# Patient Record
Sex: Female | Born: 1950
Health system: Southern US, Community
[De-identification: ages and names within clinical notes are randomized; demographics above are authoritative.]

## PROBLEM LIST (undated history)

## (undated) DIAGNOSIS — I639 Cerebral infarction, unspecified: Secondary | ICD-10-CM

## (undated) DIAGNOSIS — Z9289 Personal history of other medical treatment: Secondary | ICD-10-CM

## (undated) DIAGNOSIS — H409 Unspecified glaucoma: Secondary | ICD-10-CM

## (undated) DIAGNOSIS — I6529 Occlusion and stenosis of unspecified carotid artery: Secondary | ICD-10-CM

## (undated) DIAGNOSIS — I4891 Unspecified atrial fibrillation: Secondary | ICD-10-CM

## (undated) DIAGNOSIS — M109 Gout, unspecified: Secondary | ICD-10-CM

## (undated) DIAGNOSIS — M1990 Unspecified osteoarthritis, unspecified site: Secondary | ICD-10-CM

## (undated) DIAGNOSIS — I10 Essential (primary) hypertension: Secondary | ICD-10-CM

## (undated) HISTORY — DX: Unspecified atrial fibrillation: I48.91

## (undated) HISTORY — DX: Personal history of other medical treatment: Z92.89

## (undated) HISTORY — DX: Occlusion and stenosis of unspecified carotid artery: I65.29

---

## 1979-07-02 HISTORY — PX: BREAST BIOPSY: SHX20

## 1998-10-18 ENCOUNTER — Encounter (HOSPITAL_COMMUNITY): Admission: RE | Admit: 1998-10-18 | Discharge: 1999-01-16 | Payer: Self-pay | Admitting: Gynecology

## 1999-01-16 ENCOUNTER — Inpatient Hospital Stay (HOSPITAL_COMMUNITY): Admission: AD | Admit: 1999-01-16 | Discharge: 1999-01-16 | Payer: Self-pay | Admitting: Obstetrics and Gynecology

## 1999-01-26 ENCOUNTER — Other Ambulatory Visit: Admission: RE | Admit: 1999-01-26 | Discharge: 1999-01-26 | Payer: Self-pay | Admitting: Obstetrics and Gynecology

## 1999-02-15 ENCOUNTER — Inpatient Hospital Stay (HOSPITAL_COMMUNITY): Admission: EM | Admit: 1999-02-15 | Discharge: 1999-02-15 | Payer: Self-pay | Admitting: Gynecology

## 2000-06-30 ENCOUNTER — Other Ambulatory Visit: Admission: RE | Admit: 2000-06-30 | Discharge: 2000-06-30 | Payer: Self-pay | Admitting: Obstetrics and Gynecology

## 2009-10-31 HISTORY — PX: KNEE ARTHROSCOPY W/ DEBRIDEMENT: SHX1867

## 2012-10-09 ENCOUNTER — Inpatient Hospital Stay (HOSPITAL_COMMUNITY)
Admission: EM | Admit: 2012-10-09 | Discharge: 2012-10-11 | DRG: 065 | Disposition: A | Discharge status: Home / Self Care | Payer: 59 | Attending: Internal Medicine | Admitting: Internal Medicine

## 2012-10-09 ENCOUNTER — Emergency Department (HOSPITAL_COMMUNITY): Payer: 59

## 2012-10-09 ENCOUNTER — Encounter (HOSPITAL_COMMUNITY): Payer: Self-pay | Admitting: Emergency Medicine

## 2012-10-09 DIAGNOSIS — I69398 Other sequelae of cerebral infarction: Secondary | ICD-10-CM

## 2012-10-09 DIAGNOSIS — I10 Essential (primary) hypertension: Secondary | ICD-10-CM

## 2012-10-09 DIAGNOSIS — IMO0001 Reserved for inherently not codable concepts without codable children: Secondary | ICD-10-CM

## 2012-10-09 DIAGNOSIS — G459 Transient cerebral ischemic attack, unspecified: Secondary | ICD-10-CM

## 2012-10-09 DIAGNOSIS — E669 Obesity, unspecified: Secondary | ICD-10-CM

## 2012-10-09 DIAGNOSIS — I639 Cerebral infarction, unspecified: Secondary | ICD-10-CM

## 2012-10-09 DIAGNOSIS — E7800 Pure hypercholesterolemia, unspecified: Secondary | ICD-10-CM

## 2012-10-09 DIAGNOSIS — R7989 Other specified abnormal findings of blood chemistry: Secondary | ICD-10-CM

## 2012-10-09 DIAGNOSIS — H409 Unspecified glaucoma: Secondary | ICD-10-CM

## 2012-10-09 DIAGNOSIS — R209 Unspecified disturbances of skin sensation: Secondary | ICD-10-CM

## 2012-10-09 DIAGNOSIS — I6350 Cerebral infarction due to unspecified occlusion or stenosis of unspecified cerebral artery: Principal | ICD-10-CM | POA: Diagnosis present

## 2012-10-09 DIAGNOSIS — Z6841 Body Mass Index (BMI) 40.0 and over, adult: Secondary | ICD-10-CM

## 2012-10-09 DIAGNOSIS — M109 Gout, unspecified: Secondary | ICD-10-CM

## 2012-10-09 DIAGNOSIS — Z79899 Other long term (current) drug therapy: Secondary | ICD-10-CM

## 2012-10-09 HISTORY — DX: Gout, unspecified: M10.9

## 2012-10-09 HISTORY — DX: Unspecified osteoarthritis, unspecified site: M19.90

## 2012-10-09 HISTORY — DX: Cerebral infarction, unspecified: I63.9

## 2012-10-09 HISTORY — DX: Essential (primary) hypertension: I10

## 2012-10-09 HISTORY — DX: Unspecified glaucoma: H40.9

## 2012-10-09 LAB — COMPREHENSIVE METABOLIC PANEL
AST: 18 U/L (ref 0–37)
CO2: 26 mEq/L (ref 19–32)
Calcium: 9.6 mg/dL (ref 8.4–10.5)
Creatinine, Ser: 0.89 mg/dL (ref 0.50–1.10)
GFR calc Af Amer: 79 mL/min — ABNORMAL LOW (ref >90)
GFR calc non Af Amer: 68 mL/min — ABNORMAL LOW (ref >90)

## 2012-10-09 LAB — CREATININE, SERUM
Creatinine, Ser: 0.84 mg/dL (ref 0.50–1.10)
GFR calc Af Amer: 85 mL/min — ABNORMAL LOW (ref >90)
GFR calc non Af Amer: 73 mL/min — ABNORMAL LOW (ref >90)

## 2012-10-09 LAB — DIFFERENTIAL
Basophils Absolute: 0 10*3/uL (ref 0.0–0.1)
Eosinophils Absolute: 0.1 10*3/uL (ref 0.0–0.7)
Eosinophils Relative: 1 % (ref 0–5)
Lymphocytes Relative: 24 % (ref 12–46)
Monocytes Absolute: 0.3 10*3/uL (ref 0.1–1.0)

## 2012-10-09 LAB — GLUCOSE, CAPILLARY: Glucose-Capillary: 80 mg/dL (ref 70–99)

## 2012-10-09 LAB — CBC
HCT: 37.2 % (ref 36.0–46.0)
MCH: 27.9 pg (ref 26.0–34.0)
MCHC: 32.5 g/dL (ref 30.0–36.0)
MCV: 85.7 fL (ref 78.0–100.0)
Platelets: 258 10*3/uL (ref 150–400)
RBC: 4.64 MIL/uL (ref 3.87–5.11)
RDW: 13.8 % (ref 11.5–15.5)
RDW: 13.7 % (ref 11.5–15.5)
WBC: 8.7 10*3/uL (ref 4.0–10.5)
WBC: 10.2 10*3/uL (ref 4.0–10.5)

## 2012-10-09 LAB — RAPID URINE DRUG SCREEN, HOSP PERFORMED
Cocaine: NOT DETECTED
Opiates: NOT DETECTED
Tetrahydrocannabinol: NOT DETECTED

## 2012-10-09 MED ORDER — ASPIRIN 81 MG PO CHEW
324.0000 mg | CHEWABLE_TABLET | Freq: Once | ORAL | Status: AC
  Administered 2012-10-09: 324 mg via ORAL
  Filled 2012-10-09: qty 4

## 2012-10-09 MED ORDER — ALLOPURINOL 100 MG PO TABS
100.0000 mg | ORAL_TABLET | Freq: Every evening | ORAL | Status: DC
  Administered 2012-10-09 – 2012-10-10 (×2): 100 mg via ORAL
  Filled 2012-10-09 (×3): qty 1

## 2012-10-09 MED ORDER — LATANOPROST 0.005 % OP SOLN
1.0000 [drp] | Freq: Every day | OPHTHALMIC | Status: DC
  Administered 2012-10-09 – 2012-10-10 (×2): 1 [drp] via OPHTHALMIC
  Filled 2012-10-09: qty 2.5

## 2012-10-09 MED ORDER — METOPROLOL TARTRATE 12.5 MG HALF TABLET
12.5000 mg | ORAL_TABLET | Freq: Two times a day (BID) | ORAL | Status: DC
  Administered 2012-10-09 – 2012-10-10 (×3): 12.5 mg via ORAL
  Filled 2012-10-09 (×5): qty 1

## 2012-10-09 MED ORDER — SODIUM CHLORIDE 0.9 % IV SOLN
INTRAVENOUS | Status: DC
  Administered 2012-10-09 – 2012-10-10 (×2): via INTRAVENOUS

## 2012-10-09 MED ORDER — ATORVASTATIN CALCIUM 10 MG PO TABS
10.0000 mg | ORAL_TABLET | Freq: Every day | ORAL | Status: DC
  Administered 2012-10-09 – 2012-10-10 (×2): 10 mg via ORAL
  Filled 2012-10-09 (×2): qty 1

## 2012-10-09 MED ORDER — ATORVASTATIN CALCIUM 80 MG PO TABS: 80.0000 mg | ORAL_TABLET | Freq: Every day | ORAL | Status: DC

## 2012-10-09 MED ORDER — ASPIRIN 300 MG RE SUPP
300.0000 mg | Freq: Every day | RECTAL | Status: DC
  Filled 2012-10-09 (×2): qty 1

## 2012-10-09 MED ORDER — SENNOSIDES-DOCUSATE SODIUM 8.6-50 MG PO TABS
1.0000 | ORAL_TABLET | Freq: Every evening | ORAL | Status: DC | PRN
  Filled 2012-10-09: qty 1

## 2012-10-09 MED ORDER — ENOXAPARIN SODIUM 40 MG/0.4ML ~~LOC~~ SOLN
40.0000 mg | SUBCUTANEOUS | Status: DC
  Administered 2012-10-09 – 2012-10-10 (×2): 40 mg via SUBCUTANEOUS
  Filled 2012-10-09 (×3): qty 0.4

## 2012-10-09 MED ORDER — ATORVASTATIN CALCIUM 80 MG PO TABS
80.0000 mg | ORAL_TABLET | Freq: Once | ORAL | Status: AC
  Administered 2012-10-09: 80 mg via ORAL
  Filled 2012-10-09: qty 1

## 2012-10-09 MED ORDER — COLCHICINE 0.6 MG PO TABS
0.6000 mg | ORAL_TABLET | Freq: Every evening | ORAL | Status: DC
  Administered 2012-10-09 – 2012-10-10 (×2): 0.6 mg via ORAL
  Filled 2012-10-09 (×2): qty 1

## 2012-10-09 MED ORDER — ASPIRIN 325 MG PO TABS
325.0000 mg | ORAL_TABLET | Freq: Every day | ORAL | Status: DC
  Administered 2012-10-10 – 2012-10-11 (×2): 325 mg via ORAL
  Filled 2012-10-09 (×2): qty 1

## 2012-10-09 NOTE — ED Notes (Signed)
Troponin results received 0.79

## 2012-10-09 NOTE — Consult Note (Signed)
Referring Physician: Lynelle Doctor    Chief Complaint: Left arm decreased sensation  HPI:                                                                                                                                         Erin Holt is an 61 y.o. female who noted yesterday around 4 PM she had decreased taste in her left tongue along with decreased sensation in the left thenar portion of her hand. This lasted for about 1 hour than subsided.  This AM she awoke with no symptoms.  Round 8AM she had taken a shower and noted decreased sensation in her left aspect of her face, left arm from elbow to wrist and left great toe which evolved to decreased sensation of the left lateral leg.  She denies any weakness, visual problems, speech difficulty, or gait abnormality. Since in the ED her left leg and face have improved. Subjectively she feels her forearm still has decreased sensation but improving.   LSN: [id number] tPA Given: No: improving symptoms  Past Medical History  Diagnosis Date  . Hypertension   . Glaucoma as birth trauma   . Gout     Past surgical history: Left hip surgery  Family history: Mother: DM Father: DM  Social History: She has never smoked. She does not drink alcohol or take part in illicit drugs  Allergies: No Known Allergies  Medications:                                                                                                                           Current Facility-Administered Medications  Medication Dose Route Frequency Provider Last Rate Last Dose  . [COMPLETED] aspirin chewable tablet 324 mg  324 mg Oral Once Celene Kras, MD   324 mg at 10/09/12 1315   Current Outpatient Prescriptions  Medication Sig Dispense Refill  . allopurinol (ZYLOPRIM) 100 MG tablet Take 100 mg by mouth every evening.      . colchicine 0.6 MG tablet Take 0.6 mg by mouth every evening.      . latanoprost (XALATAN) 0.005 % ophthalmic solution Place 1 drop into both eyes at bedtime.          ROS:  History obtained from the patient  General ROS: negative for - chills, fatigue, fever, night sweats, weight gain or weight loss Psychological ROS: negative for - behavioral disorder, hallucinations, memory difficulties, mood swings or suicidal ideation Ophthalmic ROS: negative for - blurry vision, double vision, eye pain or loss of vision ENT ROS: negative for - epistaxis, nasal discharge, oral lesions, sore throat, tinnitus or vertigo Allergy and Immunology ROS: negative for - hives or itchy/watery eyes Hematological and Lymphatic ROS: negative for - bleeding problems, bruising or swollen lymph nodes Endocrine ROS: negative for - galactorrhea, hair pattern changes, polydipsia/polyuria or temperature intolerance Respiratory ROS: negative for - cough, hemoptysis, shortness of breath or wheezing Cardiovascular ROS: negative for - chest pain, dyspnea on exertion, edema or irregular heartbeat Gastrointestinal ROS: negative for - abdominal pain, diarrhea, hematemesis, nausea/vomiting or stool incontinence Genito-Urinary ROS: negative for - dysuria, hematuria, incontinence or urinary frequency/urgency Musculoskeletal ROS: positive for - joint pain due to gout Neurological ROS: As noted in HPI Dermatological ROS: negative for rash and skin lesion changes  Neurologic Examination:                                                                                                      Blood pressure 195/96, pulse 77, temperature 98.2 F (36.8 C), resp. rate 16, SpO2 100.00%.  Mental Status: Alert, oriented, thought content appropriate.  Speech fluent without evidence of aphasia.  Able to follow 3 step commands without difficulty. Cranial Nerves: II: Discs flat bilaterally; Visual fields grossly normal, pupils equal, round, reactive to light and  accommodation III,IV, VI: ptosis not present, extra-ocular motions intact bilaterally V,VII: smile symmetric, facial light touch sensation normal bilaterally VIII: hearing normal bilaterally IX,X: gag reflex present XI: bilateral shoulder shrug XII: midline tongue extension Motor: Right : Upper extremity   5/5    Left:     Upper extremity   5/5  Lower extremity   5/5     Lower extremity   5/5 with external rotation at the hip at rest Tone and bulk:normal tone throughout; no atrophy noted Sensory: light touch is decreased over the lateral aspect of her forearm to above the elbow.  Decreased pinprick and light touch in the left hand.  Otherwise intact to light touch throughout.  Deep Tendon Reflexes: 2+ and symmetric UE, 1+ bilateral KJ, no AJ noted bilaterally Plantars: Right: downgoing   Left: downgoing Cerebellar: normal finger-to-nose,  normal heel-to-shin test CV: pulses palpable throughout     Results for orders placed during the hospital encounter of 10/09/12 (from the past 48 hour(s))  PROTIME-INR     Status: Normal   Collection Time   10/09/12 10:01 AM      Component Value Range Comment   Prothrombin Time 12.9  11.6 - 15.2 seconds    INR 0.98  0.00 - 1.49   APTT     Status: Normal   Collection Time   10/09/12 10:01 AM      Component Value Range Comment   aPTT 31  24 - 37 seconds   CBC  Status: Normal   Collection Time   10/09/12 10:01 AM      Component Value Range Comment   WBC 8.7  4.0 - 10.5 K/uL    RBC 4.34  3.87 - 5.11 MIL/uL    Hemoglobin 12.1  12.0 - 15.0 g/dL    HCT 45.4  09.8 - 11.9 %    MCV 85.7  78.0 - 100.0 fL    MCH 27.9  26.0 - 34.0 pg    MCHC 32.5  30.0 - 36.0 g/dL    RDW 14.7  82.9 - 56.2 %    Platelets 231  150 - 400 K/uL   DIFFERENTIAL     Status: Normal   Collection Time   10/09/12 10:01 AM      Component Value Range Comment   Neutrophils Relative 71  43 - 77 %    Neutro Abs 6.2  1.7 - 7.7 K/uL    Lymphocytes Relative 24  12 - 46 %     Lymphs Abs 2.1  0.7 - 4.0 K/uL    Monocytes Relative 4  3 - 12 %    Monocytes Absolute 0.3  0.1 - 1.0 K/uL    Eosinophils Relative 1  0 - 5 %    Eosinophils Absolute 0.1  0.0 - 0.7 K/uL    Basophils Relative 0  0 - 1 %    Basophils Absolute 0.0  0.0 - 0.1 K/uL   COMPREHENSIVE METABOLIC PANEL     Status: Abnormal   Collection Time   10/09/12 10:01 AM      Component Value Range Comment   Sodium 140  135 - 145 mEq/L    Potassium 4.0  3.5 - 5.1 mEq/L    Chloride 104  96 - 112 mEq/L    CO2 26  19 - 32 mEq/L    Glucose, Bld 102 (*) 70 - 99 mg/dL    BUN 19  6 - 23 mg/dL    Creatinine, Ser 1.30  0.50 - 1.10 mg/dL    Calcium 9.6  8.4 - 86.5 mg/dL    Total Protein 7.5  6.0 - 8.3 g/dL    Albumin 3.5  3.5 - 5.2 g/dL    AST 18  0 - 37 U/L    ALT 11  0 - 35 U/L    Alkaline Phosphatase 79  39 - 117 U/L    Total Bilirubin 0.4  0.3 - 1.2 mg/dL    GFR calc non Af Amer 68 (*) >90 mL/min    GFR calc Af Amer 79 (*) >90 mL/min   TROPONIN I     Status: Abnormal   Collection Time   10/09/12 10:03 AM      Component Value Range Comment   Troponin I 0.79 (*) <0.30 ng/mL   URINE RAPID DRUG SCREEN (HOSP PERFORMED)     Status: Normal   Collection Time   10/09/12 10:33 AM      Component Value Range Comment   Opiates NONE DETECTED  NONE DETECTED    Cocaine NONE DETECTED  NONE DETECTED    Benzodiazepines NONE DETECTED  NONE DETECTED    Amphetamines NONE DETECTED  NONE DETECTED    Tetrahydrocannabinol NONE DETECTED  NONE DETECTED    Barbiturates NONE DETECTED  NONE DETECTED   GLUCOSE, CAPILLARY     Status: Normal   Collection Time   10/09/12 10:38 AM      Component Value Range Comment   Glucose-Capillary 86  70 - 99 mg/dL  Ct Head Wo Contrast  10/09/2012  *RADIOLOGY REPORT*  Clinical Data: Left-sided numbness.  Elevated blood pressure.  CT HEAD WITHOUT CONTRAST  Technique:  Contiguous axial images were obtained from the base of the skull through the vertex without contrast.  Comparison: None.   Findings: There is no evidence for acute infarction, intracranial hemorrhage, mass lesion, hydrocephalus, or extra-axial fluid.  No cerebral or cerebellar atrophy.  Hypodensity throughout the subcortical and periventricular white matter likely represents chronic microvascular ischemic change.  No large vessel infarct. Brainstem and cerebellum grossly unremarkable.  Bone windows reveal moderate bony overgrowth anteriorly in the middle cranial fossa and anterior cranial fossa consistent with hyperostosis frontalis interna.  The mastoid air cells are clear.  There is mild ethmoid fluid on the left.  Frontal sinuses are clear. No visible sphenoid or mastoid fluid.  IMPRESSION: No acute intracranial findings.  Bilateral subcortical and deep white matter hypodensity consistent with chronic microvascular ischemic change.   Original Report Authenticated By: Davonna Belling, M.D.     Felicie Morn PA-C [facility] [phone]  10/09/2012, 1:32 PM   Patient seen and examined.  Clinical course and management discussed.  Necessary edits performed.  I agree with the above.  Assessment and plan of care developed and discussed below.    Assessment: 61 y.o. female presenting with left sided numbness.  Symptoms improving while in the ED.  Patient with vascular risk factor of HTN that has been poorly controlled due to lack of follow up.  Further work up indicated.  Patient on no antiplatelet therapy.  Stroke Risk Factors - hypertension  Plan: 1. HgbA1c, fasting lipid panel 2. MRI, MRA  of the brain without contrast 3. OT consult 4. Echocardiogram 5. Carotid dopplers 6. Prophylactic therapy-Antiplatelet med: Aspirin - dose 325mg  daily 7. Risk factor modification 8. Telemetry monitoring 9. Frequent neuro checks  Thana Farr, MD [facility] [phone]  10/09/2012  3:29 PM

## 2012-10-09 NOTE — H&P (Signed)
PATIENT DETAILS Name: Erin Holt Age: 61 y.o. Sex: female Date of Birth: 1951/04/30 Admit Date: 10/09/2012 OZH:[id number],ONGEX CHARLES, MD   CHIEF COMPLAINT:  Left facial numbness and left arm numbness since 4 PM yesterday  HPI: Patient is a 61 year old African American female with a history of gout, glaucoma, morbid obesity who presents to the ED today for evaluation of the above noted complaints. The patient on 4 PM yesterday she had sudden onset of left facial and left hand numbness. This  apparently lasted for approximately an hour and resolved spontaneously. However it reoccurred around 8:00 this morning. She also noted that there was some numbness in her left great toe area. She denied any dysarthria or any weakness. She subsequently presented to ED for further evaluation, I was subsequently asked to admit this patient. There is no history of chest pain or shortness of breath There is no history of headache There is no history of nausea vomiting or diarrhea There is no history of abdominal pain  ALLERGIES:  No Known Allergies  PAST MEDICAL HISTORY: Past Medical History  Diagnosis Date  . Hypertension   . Glaucoma as birth trauma   . Gout     PAST SURGICAL HISTORY: No past surgical history on file.  MEDICATIONS AT HOME: Prior to Admission medications   Medication Sig Start Date End Date Taking? Authorizing Provider  allopurinol (ZYLOPRIM) 100 MG tablet Take 100 mg by mouth every evening.   Yes Historical Provider, MD  colchicine 0.6 MG tablet Take 0.6 mg by mouth every evening.   Yes Historical Provider, MD  latanoprost (XALATAN) 0.005 % ophthalmic solution Place 1 drop into both eyes at bedtime.   Yes Historical Provider, MD    FAMILY HISTORY: No family history on file.  SOCIAL HISTORY:  reports that she has never smoked. She does not have any smokeless tobacco history on file. She reports that she does not drink alcohol. Her drug history not on file.  REVIEW  OF SYSTEMS:  Constitutional:   No  weight loss, night sweats,  Fevers, chills, fatigue.  HEENT:    No headaches, Difficulty swallowing,Tooth/dental problems,Sore throat,  No sneezing, itching, ear ache, nasal congestion, post nasal drip,   Cardio-vascular: No chest pain,  Orthopnea, PND, swelling in lower extremities, anasarca, dizziness, palpitations  GI:  No heartburn, indigestion, abdominal pain, nausea, vomiting, diarrhea, change in  bowel habits, loss of appetite  Resp: No shortness of breath with exertion or at rest.  No excess mucus, no productive cough, No non-productive cough,  No coughing up of blood.No change in color of mucus.No wheezing.No chest wall deformity  Skin:  no rash or lesions.  GU:  no dysuria, change in color of urine, no urgency or frequency.  No flank pain.  Musculoskeletal: No joint pain or swelling.  No decreased range of motion.  No back pain.  Psych: No change in mood or affect. No depression or anxiety.  No memory loss.   PHYSICAL EXAM: Blood pressure 195/96, pulse 77, temperature 98.2 F (36.8 C), resp. rate 16, SpO2 100.00%.  General appearance :Awake, alert, not in any distress. Speech Clear. Not toxic Looking HEENT: Atraumatic and Normocephalic, pupils equally reactive to light and accomodation Neck: supple, no JVD. No cervical lymphadenopathy.  Chest:Good air entry bilaterally, no added sounds  CVS: S1 S2 regular, no murmurs.  Abdomen: Bowel sounds present, Non tender and not distended with no gaurding, rigidity or rebound. Extremities: B/L Lower Ext shows no edema, both legs are warm to  touch, with  dorsalis pedis pulses palpable. Neurology: Awake alert, and oriented X 3, CN II-XII intact, Non focal Skin:No Rash Wounds:N/A  LABS ON ADMISSION:   Basename 10/09/12 1001  NA 140  K 4.0  CL 104  CO2 26  GLUCOSE 102*  BUN 19  CREATININE 0.89  CALCIUM 9.6  MG --  PHOS --    Basename 10/09/12 1001  AST 18  ALT 11  ALKPHOS 79   BILITOT 0.4  PROT 7.5  ALBUMIN 3.5   No results found for this basename: LIPASE:2,AMYLASE:2 in the last 72 hours  Basename 10/09/12 1001  WBC 8.7  NEUTROABS 6.2  HGB 12.1  HCT 37.2  MCV 85.7  PLT 231    Basename 10/09/12 1003  CKTOTAL --  CKMB --  CKMBINDEX --  TROPONINI 0.79*   No results found for this basename: DDIMER:2 in the last 72 hours No components found with this basename: POCBNP:3   RADIOLOGIC STUDIES ON ADMISSION: Ct Head Wo Contrast  10/09/2012  *RADIOLOGY REPORT*  Clinical Data: Left-sided numbness.  Elevated blood pressure.  CT HEAD WITHOUT CONTRAST  Technique:  Contiguous axial images were obtained from the base of the skull through the vertex without contrast.  Comparison: None.  Findings: There is no evidence for acute infarction, intracranial hemorrhage, mass lesion, hydrocephalus, or extra-axial fluid.  No cerebral or cerebellar atrophy.  Hypodensity throughout the subcortical and periventricular white matter likely represents chronic microvascular ischemic change.  No large vessel infarct. Brainstem and cerebellum grossly unremarkable.  Bone windows reveal moderate bony overgrowth anteriorly in the middle cranial fossa and anterior cranial fossa consistent with hyperostosis frontalis interna.  The mastoid air cells are clear.  There is mild ethmoid fluid on the left.  Frontal sinuses are clear. No visible sphenoid or mastoid fluid.  IMPRESSION: No acute intracranial findings.  Bilateral subcortical and deep white matter hypodensity consistent with chronic microvascular ischemic change.   Original Report Authenticated By: Davonna Belling, M.D.     ASSESSMENT AND PLAN: Present on Admission:  . CVA (cerebral infarction) - Probable CVA  - Was admitted to neuro telemetry unit, start on aspirin and statin -Well, and stroke workup with MRI of the brain, echocardiogram and carotid Doppler  - Neurology has been consulted   . Elevated troponin - Suspect false positive  elevation, possibly from above  - She has no chest pain or shortness of breath, EKG does not show any acute changes  - Place on statin, aspirin and beta blockers  - Await 2-D echocardiogram  - Have consulted [facility] cardiology   . Gout - Continue with colchicine   . Glaucoma - Continue with her home eyedrops   . Obesity - Have counseled the importance of weight loss  Further plan will depend as patient's clinical course evolves and further radiologic and laboratory data become available. Patient will be monitored closely.   DVT Prophylaxis: - Prophylactic Lovenox  Code Status: - Full code  Total time spent for admission equals 45 minutes.  [facility] Pager [phone]  If 7PM-7AM, please contact night-coverage [email or web address] Password [email or web address] 10/09/2012, 1:21 PM

## 2012-10-09 NOTE — ED Notes (Signed)
MD at bedside; admitting

## 2012-10-09 NOTE — ED Notes (Signed)
Yesterday noticed that left side side left of tongue was numb nad left side of hand numb . Went to bed slept well and this am  She felt numbness down to toes and left side of face was numb pt able to walk well and has no slurred speech at this time

## 2012-10-09 NOTE — ED Notes (Signed)
Admitting MD made aware of elevated troponin

## 2012-10-09 NOTE — ED Provider Notes (Signed)
History     CSN: [id number] Arrival date & time 10/09/12  4098 First MD Initiated Contact with Patient 10/09/12 0944      Patient is a 61 y.o. female presenting with neurologic complaint. The history is provided by the patient.  Neurologic Problem The primary symptoms include loss of sensation. Primary symptoms do not include headaches, focal weakness or speech change. The symptoms began yesterday. The symptoms are unchanged. The neurological symptoms are focal.  Description of loss of sensation: it feels tingly like when it goes to sleep. Location of loss of sensation: left face and left hand.  Additional symptoms do not include neck stiffness, weakness, pain or photophobia. Medical issues also include hypertension.  Yesterday she noticed the symptoms start but they then resolved.  She noticed this morning that her left arm felt numb as well.  Past Medical History  Diagnosis Date  . Hypertension   . Glaucoma as birth trauma   . Gout     No past surgical history on file.  No family history on file.  History  Substance Use Topics  . Smoking status: Never Smoker   . Smokeless tobacco: Not on file  . Alcohol Use: No    OB History    Grav Para Term Preterm Abortions TAB SAB Ect Mult Living                  Review of Systems  HENT: Negative for neck stiffness.   Eyes: Negative for photophobia.  Neurological: Negative for speech change, focal weakness, weakness and headaches.  All other systems reviewed and are negative.    Allergies  Review of patient's allergies indicates no known allergies.  Home Medications  No current outpatient prescriptions on file.  BP 195/96  Pulse 77  Temp 98.9 F (37.2 C)  Resp 16  SpO2 100%  Physical Exam  Nursing note and vitals reviewed. Constitutional: She is oriented to person, place, and time. She appears well-developed and well-nourished. No distress.  HENT:  Head: Normocephalic and atraumatic.  Right Ear: External ear  normal.  Left Ear: External ear normal.  Mouth/Throat: Oropharynx is clear and moist.  Eyes: Conjunctivae normal are normal. Right eye exhibits no discharge. Left eye exhibits no discharge. No scleral icterus.  Neck: Neck supple. No tracheal deviation present.  Cardiovascular: Normal rate, regular rhythm and intact distal pulses.   Pulmonary/Chest: Effort normal and breath sounds normal. No stridor. No respiratory distress. She has no wheezes. She has no rales.  Abdominal: Soft. Bowel sounds are normal. She exhibits no distension. There is no tenderness. There is no rebound and no guarding.  Musculoskeletal: She exhibits no edema and no tenderness.  Neurological: She is alert and oriented to person, place, and time. She has normal strength. A sensory deficit (feels similar both sides to light touch and scratching) is present. No cranial nerve deficit ( no gross defecits noted). She exhibits normal muscle tone. She displays no seizure activity. Coordination normal.       No pronator drift bilateral upper extrem, able to hold both legs off bed for 5 seconds, sensation intact in all extremities, no visual field cuts, no left or right sided neglect  Skin: Skin is warm and dry. No rash noted.  Psychiatric: She has a normal mood and affect.    ED Course  Procedures (including critical care time)  Rate: 68  Rhythm: normal sinus rhythm  QRS Axis: normal  Intervals: normal  ST/T Wave abnormalities: minimal st depression lateral leads  Conduction Disutrbances:none  Narrative Interpretation:   Old EKG Reviewed: none available  Labs Reviewed  COMPREHENSIVE METABOLIC PANEL - Abnormal; Notable for the following:    Glucose, Bld 102 (*)     GFR calc non Af Amer 68 (*)     GFR calc Af Amer 79 (*)     All other components within normal limits  TROPONIN I - Abnormal; Notable for the following:    Troponin I 0.79 (*)     All other components within normal limits  PROTIME-INR  APTT  CBC   DIFFERENTIAL  URINE RAPID DRUG SCREEN (HOSP PERFORMED)  GLUCOSE, CAPILLARY   Ct Head Wo Contrast  10/09/2012  *RADIOLOGY REPORT*  Clinical Data: Left-sided numbness.  Elevated blood pressure.  CT HEAD WITHOUT CONTRAST  Technique:  Contiguous axial images were obtained from the base of the skull through the vertex without contrast.  Comparison: None.  Findings: There is no evidence for acute infarction, intracranial hemorrhage, mass lesion, hydrocephalus, or extra-axial fluid.  No cerebral or cerebellar atrophy.  Hypodensity throughout the subcortical and periventricular white matter likely represents chronic microvascular ischemic change.  No large vessel infarct. Brainstem and cerebellum grossly unremarkable.  Bone windows reveal moderate bony overgrowth anteriorly in the middle cranial fossa and anterior cranial fossa consistent with hyperostosis frontalis interna.  The mastoid air cells are clear.  There is mild ethmoid fluid on the left.  Frontal sinuses are clear. No visible sphenoid or mastoid fluid.  IMPRESSION: No acute intracranial findings.  Bilateral subcortical and deep white matter hypodensity consistent with chronic microvascular ischemic change.   Original Report Authenticated By: Davonna Belling, M.D.       MDM  Pt noted to have chronic changes on CT.  No definite stroke but her symptoms are still concerning for that possibility.    Will plan on admission for further evaluation, stroke/tia workup.  Pt has an elevated troponin but no chest pain.  Doubt acute cardiac etiology.  Troponin could be related to CNS event.  Will continue to monitor.        Celene Kras, MD 10/09/12 [phone]

## 2012-10-09 NOTE — ED Notes (Signed)
Neuro PA at bedside.  

## 2012-10-09 NOTE — Consult Note (Signed)
Admit date: 10/09/2012 Referring Physician  Dr. Lynelle Doctor Primary Physician Darnelle Bos, MD Primary Cardiologist  None Reason for Consultation  Elevated troponin  HPI: 61 year old female who presented with neurologic symptoms of left facial numbness, left arm numbness, prior tongue paresthesias with comorbidities of morbid obesity, possible hypertension. During workup, troponin was elevated at 0.7 then 0.9 on repeat. She does not have any chest discomfort, no shortness of breath, no unilateral leg edema. She was performing heavy labor yesterday, sweeping for instance, with no exertional anginal symptoms. At approximally for p.m. yesterday she started to notice numbness in her tongue. This had progressed to the left side of her face as well his left arm and prompted an evaluation here. She denies any cough, fevers, chills, syncope, palpitations, orthopnea, PND. She is currently speaking very clearly. 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. [employer].   In 2009, her LDL was 175, HDL was 64. Her creatinine and liver functions at that time as well as electrolytes were normal. It had been mentioned that her blood pressure at times had been elevated.    PMH:   Past Medical History  Diagnosis Date  . Hypertension   . Glaucoma as birth trauma   . Gout     PSH:  No past surgical history on file. Allergies:  Review of patient's allergies indicates no known allergies. Prior to Admit Meds:   (Not in a hospital admission) Prior to Admission medications   Medication Sig Start Date End Date Taking? Authorizing Provider  allopurinol (ZYLOPRIM) 100 MG tablet Take 100 mg by mouth every evening.   Yes Historical Provider, MD  colchicine 0.6 MG tablet Take 0.6 mg by mouth every evening.   Yes Historical Provider, MD  latanoprost (XALATAN) 0.005 % ophthalmic solution Place 1 drop into both eyes at bedtime.   Yes Historical Provider, MD    Fam HX:   No family history on file. Social HX:     History   Social History  . Marital Status: Unknown    Spouse Name: N/A    Number of Children: N/A  . Years of Education: N/A   Occupational History  . Not on file.   Social History Main Topics  . Smoking status: Never Smoker   . Smokeless tobacco: Not on file  . Alcohol Use: No  . Drug Use:   . Sexually Active:    Other Topics Concern  . Not on file   Social History Narrative  . No narrative on file     ROS:  All 11 ROS were addressed and are negative except what is stated in the HPI  Physical Exam: Blood pressure 171/62, pulse 63, temperature 98.2 F (36.8 C), resp. rate 24, SpO2 100.00%.    General: Well developed, well nourished, in no acute distress Head: Eyes PERRLA, No xanthomas.   Normal cephalic and atramatic  Lungs:   Clear bilaterally to auscultation and percussion. Normal respiratory effort. No wheezes, no rales. Heart:   HRRR S1 S2 Pulses are 2+ & equal. No murmurs or rubs detected           No carotid bruit. No JVD.  No abdominal bruits. Abdomen: Bowel sounds are positive, abdomen soft and non-tender without masses. No hepatosplenomegaly. Obese. Msk:  Back normal. Normal strength and tone for age. Extremities:   No clubbing, cyanosis or edema.  DP +1 Neuro: Alert and oriented X 3, moves all extremities, 2+ grip strength bilaterally. She does note decreased sensation/sensation of numbness  throughout her left hand and part of her forearm as well as the left side of her face. She notes that this may be improving. GU: Deferred Rectal: Deferred Psych:  Good affect, responds appropriately    Labs:   Lab Results  Component Value Date   WBC 8.7 10/09/2012   HGB 12.1 10/09/2012   HCT 37.2 10/09/2012   MCV 85.7 10/09/2012   PLT 231 10/09/2012    Lab 10/09/12 1001  NA 140  K 4.0  CL 104  CO2 26  BUN 19  CREATININE 0.89  CALCIUM 9.6  PROT 7.5  BILITOT 0.4  ALKPHOS 79  ALT 11  AST 18  GLUCOSE 102*   No results found for this basename: PTT    Lab Results  Component Value Date   INR 0.98 10/09/2012   Lab Results  Component Value Date   TROPONINI 0.92* 10/09/2012        Radiology:  Ct Head Wo Contrast  10/09/2012  *RADIOLOGY REPORT*  Clinical Data: Left-sided numbness.  Elevated blood pressure.  CT HEAD WITHOUT CONTRAST  Technique:  Contiguous axial images were obtained from the base of the skull through the vertex without contrast.  Comparison: None.  Findings: There is no evidence for acute infarction, intracranial hemorrhage, mass lesion, hydrocephalus, or extra-axial fluid.  No cerebral or cerebellar atrophy.  Hypodensity throughout the subcortical and periventricular white matter likely represents chronic microvascular ischemic change.  No large vessel infarct. Brainstem and cerebellum grossly unremarkable.  Bone windows reveal moderate bony overgrowth anteriorly in the middle cranial fossa and anterior cranial fossa consistent with hyperostosis frontalis interna.  The mastoid air cells are clear.  There is mild ethmoid fluid on the left.  Frontal sinuses are clear. No visible sphenoid or mastoid fluid.  IMPRESSION: No acute intracranial findings.  Bilateral subcortical and deep white matter hypodensity consistent with chronic microvascular ischemic change.   Original Report Authenticated By: Davonna Belling, M.D.    Personally viewed.  EKG:  Sinus rhythm rate 79 with occasional PACs, nonspecific ST-T wave changes. Personally viewed.   ASSESSMENT/PLAN:   61 year old female with neurologic symptoms of left facial numbness, prior tongue numbness/paresthesia, left forearm/hand numbness with normal movement/strength, morbid obesity, elevated blood pressure, hyperlipidemia with mildly elevated troponin without any anginal symptoms.  1. Elevated troponin-this may be secondary to acute CVA/intracranial event. EKG does not show any evidence of significant ischemia. She does not describe any chest pain. She does not describe shortness of  breath, unilateral leg pain or swelling (i.e. no symptoms of PE). She currently appears calm, pleasant. An echocardiogram has been ordered as part of the stroke workup and this will be beneficial to evaluate her overall wall motion/ejection fraction. She has received aspirin. Further cardiac workup pending on ultrasound interpretation. If troponin becomes highly elevated, we may also consider further cardiac workup for ischemia at that time. For now, continue with monitoring. I would not add anticoagulation in the setting of possible stroke due to the possibility of hemorrhagic transformation. MRI will be helpful. Further risk stratification with stress testing may be reasonable when she is over this acute neurologic event and would likely be done as an outpatient.  2. Hyperlipidemia-prior LDL 175. I will add atorvastatin 80 mg. Check fasting lipid profile.  3. Elevated blood pressure-she has not had a confirmed diagnosis of hypertension. Continue to monitor. Blood pressure modification in the setting of possible stroke at the discretion of neurology.  4. Morbid obesity-continue to encourage weight loss.  5. Possible  strokelike symptoms-paresthesias noted. Per neurology.  We will follow with you.  Donato Schultz, MD  10/09/2012  2:36 PM

## 2012-10-09 NOTE — ED Notes (Addendum)
Dr Lynelle Doctor informed of troponin results . Already aware. Pt too CT at this time . Warm blanket provided, Side rails up . Pt made aware of elevated blood results - verbalized understanding

## 2012-10-09 NOTE — ED Notes (Signed)
MD at bedside.cardiology  

## 2012-10-10 ENCOUNTER — Inpatient Hospital Stay (HOSPITAL_COMMUNITY): Payer: 59

## 2012-10-10 LAB — GLUCOSE, CAPILLARY
Glucose-Capillary: 106 mg/dL — ABNORMAL HIGH (ref 70–99)
Glucose-Capillary: 96 mg/dL (ref 70–99)

## 2012-10-10 LAB — LIPID PANEL
HDL: 48 mg/dL (ref >39)
LDL Cholesterol: 111 mg/dL — ABNORMAL HIGH (ref 0–99)
Total CHOL/HDL Ratio: 4 RATIO

## 2012-10-10 LAB — TROPONIN I: Troponin I: 0.67 ng/mL (ref <0.30)

## 2012-10-10 MED ORDER — LORAZEPAM 2 MG/ML IJ SOLN
0.5000 mg | Freq: Once | INTRAMUSCULAR | Status: AC
  Administered 2012-10-10: 0.5 mg via INTRAVENOUS
  Filled 2012-10-10: qty 1

## 2012-10-10 MED ORDER — ATORVASTATIN CALCIUM 40 MG PO TABS
40.0000 mg | ORAL_TABLET | Freq: Every day | ORAL | Status: DC
  Filled 2012-10-10: qty 1

## 2012-10-10 NOTE — Progress Notes (Signed)
Stroke Team Progress Note  HISTORY Erin Holt is an 61 y.o. female who noted yesterday around 4 PM she had decreased taste in her left tongue along with decreased sensation in the left thenar portion of her hand. This lasted for about 1 hour than subsided. This AM she awoke with no symptoms. Round 8AM she had taken a shower and noted decreased sensation in her left aspect of her face, left arm from elbow to wrist and left great toe which evolved to decreased sensation of the left lateral leg. She denies any weakness, visual problems, speech difficulty, or gait abnormality. Since in the ED her left leg and face have improved. Subjectively she feels her forearm still has decreased sensation but improving. Patient was not a TPA candidate secondary to improving symptoms. She was admitted for further evaluation and treatment.  SUBJECTIVE She feels left sided numbness is improved but not back to baseline. No prior h/o stroke or TIA  OBJECTIVE Most recent Vital Signs: Filed Vitals:   10/10/12 0000 10/10/12 0200 10/10/12 0400 10/10/12 0800  BP: 173/94 165/90 191/89 181/94  Pulse: 62 65 73 63  Temp: 98.6 F (37 C) 98.4 F (36.9 C) 98.3 F (36.8 C) 98.3 F (36.8 C)  TempSrc: Oral Oral Oral Oral  Resp: 20 18 20 20   Height:      Weight:      SpO2: 97% 100% 96% 100%   CBG (last 3)   Basename 10/10/12 0725 10/09/12 2218 10/09/12 1718  GLUCAP 101* 114* 80   IV Fluid Intake:     . sodium chloride 75 mL/hr at 10/09/12 1755   MEDICATIONS    . allopurinol  100 mg Oral QPM  . [COMPLETED] aspirin  324 mg Oral Once  . aspirin  300 mg Rectal Daily   Or  . aspirin  325 mg Oral Daily  . atorvastatin  10 mg Oral q1800  . [COMPLETED] atorvastatin  80 mg Oral Once  . colchicine  0.6 mg Oral QPM  . enoxaparin  40 mg Subcutaneous Q24H  . latanoprost  1 drop Both Eyes QHS  . metoprolol tartrate  12.5 mg Oral BID  . [DISCONTINUED] atorvastatin  80 mg Oral q1800   PRN:  senna-docusate  Diet:   Cardiac thin liquids Activity:  OOB in chair DVT Prophylaxis:  Lovenox 40 mg sq daily   CLINICALLY SIGNIFICANT STUDIES Basic Metabolic Panel:  Lab 10/09/12 1610 10/09/12 1001  NA -- 140  K -- 4.0  CL -- 104  CO2 -- 26  GLUCOSE -- 102*  BUN -- 19  CREATININE 0.84 0.89  CALCIUM -- 9.6  MG -- --  PHOS -- --   Liver Function Tests:  Lab 10/09/12 1001  AST 18  ALT 11  ALKPHOS 79  BILITOT 0.4  PROT 7.5  ALBUMIN 3.5   CBC:  Lab 10/09/12 1742 10/09/12 1001  WBC 10.2 8.7  NEUTROABS -- 6.2  HGB 12.7 12.1  HCT 39.7 37.2  MCV 85.6 85.7  PLT 258 231   Coagulation:  Lab 10/09/12 1001  LABPROT 12.9  INR 0.98   Cardiac Enzymes:  Lab 10/09/12 1244 10/09/12 1003  CKTOTAL -- --  CKMB -- --  CKMBINDEX -- --  TROPONINI 0.92* 0.79*   Urinalysis: No results found for this basename: COLORURINE:2,APPERANCEUR:2,LABSPEC:2,PHURINE:2,GLUCOSEU:2,HGBUR:2,BILIRUBINUR:2,KETONESUR:2,PROTEINUR:2,UROBILINOGEN:2,NITRITE:2,LEUKOCYTESUR:2 in the last 168 hours Lipid Panel    Component Value Date/Time   CHOL 190 10/10/2012 0625   TRIG 156* 10/10/2012 0625   HDL 48 10/10/2012 0625  CHOLHDL 4.0 10/10/2012 0625   VLDL 31 10/10/2012 0625   LDLCALC 111* 10/10/2012 0625   HgbA1C  No results found for this basename: HGBA1C    Urine Drug Screen:     Component Value Date/Time   LABOPIA NONE DETECTED 10/09/2012 1033   COCAINSCRNUR NONE DETECTED 10/09/2012 1033   LABBENZ NONE DETECTED 10/09/2012 1033   AMPHETMU NONE DETECTED 10/09/2012 1033   THCU NONE DETECTED 10/09/2012 1033   LABBARB NONE DETECTED 10/09/2012 1033    Alcohol Level: No results found for this basename: ETH:2 in the last 168 hours  CT of the brain  10/09/2012  No acute intracranial findings.  Bilateral subcortical and deep white matter hypodensity consistent with chronic microvascular ischemic change.  MRI of the brain  Small Rt lateral thalamic infarct  MRA of the brain  No large vessel stenosis or occlusion  2D  Echocardiogram  pending  Carotid Doppler  pending CXR    EKG  normal sinus rhythm.   Therapy Recommendations pending  Physical Exam  Obese middle aged african american lady not in distress.Awake alert. Afebrile. Head is nontraumatic. Neck is supple without bruit. Hearing is normal. Cardiac exam no murmur or gallop. Lungs are clear to auscultation. Distal pulses are well felt.  Neurological Exam : Awake alert oriented x 3 normal speech and language.Fundi not visualized. Vision acuity and fields appear normal. No face asymmetry. Tongue midline. No drift. Mild diminished fine finger movements on left. Orbits right over left upper extremity. Mild left grip weak.. Normal sensation . Normal coordination.  ASSESSMENT Erin Holt is a 61 y.o. female presenting with decreased sensation on her left side. Imaging confirms a right thalamic infarct. Infarct felt to be thrombotic secondary to  Small vessel disease.  Work up underway. On no antiplatelet prior to admission. Now on aspirin 325 mg orally every day for secondary stroke prevention. Patient with resultant mild left UE weakness and numbness .   Elevated troponin, cardiology consulted Hyperlipidemia, LDL 111, now,on atorvastatin 80, goal LDL < 100 Hypertension Morbid obesity, Body mass index is 44.15 kg/(m^2).  Hospital day # 1  TREATMENT/PLAN  Continue aspirin 325 mg orally every day for secondary stroke prevention.  F/u 2D, CD, MRI, MRA, HgbA1c  SHARON BIBY, MSN, RN, ANVP-BC, ANP-BC, GNP-BC [facility] Pager: [phone] 10/10/2012 8:33 AM   I have personally examined this patient, reviewed pertinent data and developed the plan of care. I agree with above.   Delia Heady, MD Medical Director [facility] Pager: [phone] 10/10/2012 11:48 AM

## 2012-10-10 NOTE — Progress Notes (Signed)
SLP Cancellation Note  Patient Details Name: Erin Holt MRN: [id number] DOB: Dec 07, 1950   Cancelled treatment:       Reason Eval/Treat Not Completed: Other (comment) (Screened) Pt briefly screened for cognitive linguistic deficits. Appears WNL. Pt with no complaints. Will defer full evaluation. No SLP f/u, will sign off. Please reconsult if needed.   Harlon Ditty, MA CCC-SLP [phone]  Erin Holt 10/10/2012, 2:35 PM

## 2012-10-10 NOTE — Care Management Note (Signed)
    Page 1 of 1   10/10/2012     5:01:54 PM   CARE MANAGEMENT NOTE 10/10/2012  Patient:  Erin Holt, Erin Holt   Account Number:  [id number]  Date Initiated:  10/10/2012  Documentation initiated by:  Donn Pierini  Subjective/Objective Assessment:   Pt admitted for +CVA per MRI     Action/Plan:   PTA pt lived at home alone- PT/OT/ST evals pending- NCM to follow for d/c needs   Anticipated DC Date:  10/12/2012   Anticipated DC Plan:        DC Planning Services  CM consult      Choice offered to / List presented to:             Status of service:  In process, will continue to follow Medicare Important Message given?   (If response is "NO", the following Medicare IM given date fields will be blank) Date Medicare IM given:   Date Additional Medicare IM given:    Discharge Disposition:    Per UR Regulation:  Reviewed for med. necessity/level of care/duration of stay  If discussed at Long Length of Stay Meetings, dates discussed:    Comments:

## 2012-10-10 NOTE — Progress Notes (Signed)
Utilization review completed.  

## 2012-10-10 NOTE — Progress Notes (Signed)
VASCULAR LAB PRELIMINARY  PRELIMINARY  PRELIMINARY  PRELIMINARY  Carotid duplex  completed.    Preliminary report:  Right:  60-79% internal carotid artery stenosis.  Left:  No evidence of hemodynamically significant internal carotid artery stenosis.  Bilateral:  Vertebral artery flow is antegrade.     Mertie Haslem, RVT 10/10/2012, 1:23 PM

## 2012-10-10 NOTE — Progress Notes (Signed)
Subjective:  No chest pain, no SOB. Still with paresthesias left face, hand, forearm.   Objective:  Vital Signs in the last 24 hours: Temp:  [98.3 F (36.8 C)-98.7 F (37.1 C)] 98.3 F (36.8 C) (12/11 0800) Pulse Rate:  [61-77] 63  (12/11 0800) Resp:  [14-24] 20  (12/11 0800) BP: (156-199)/(53-97) 181/94 mmHg (12/11 0800) SpO2:  [96 %-100 %] 100 % (12/11 0800) Weight:  [116.665 kg (257 lb 3.2 oz)] 116.665 kg (257 lb 3.2 oz) (12/10 1650)  Intake/Output from previous day: 12/10 0701 - 12/11 0700 In: -  Out: 250 [Urine:250]   Physical Exam: General: Well developed, well nourished, in no acute distress. Head:  Normocephalic and atraumatic. Lungs: Clear to auscultation and percussion. Heart: Normal S1 and S2.  No murmur, rubs or gallops.  Abdomen: soft, non-tender, positive bowel sounds. Obese Extremities: No clubbing or cyanosis. No edema. Neurologic: Alert and oriented x 3.    Lab Results:  Basename 10/09/12 1742 10/09/12 1001  WBC 10.2 8.7  HGB 12.7 12.1  PLT 258 231    Basename 10/09/12 1742 10/09/12 1001  NA -- 140  K -- 4.0  CL -- 104  CO2 -- 26  GLUCOSE -- 102*  BUN -- 19  CREATININE 0.84 0.89    Basename 10/09/12 1244 10/09/12 1003  TROPONINI 0.92* 0.79*   Hepatic Function Panel  Basename 10/09/12 1001  PROT 7.5  ALBUMIN 3.5  AST 18  ALT 11  ALKPHOS 79  BILITOT 0.4  BILIDIR --  IBILI --    Basename 10/10/12 0625  CHOL 190   No results found for this basename: PROTIME in the last 72 hours  Imaging: Ct Head Wo Contrast  10/09/2012  *RADIOLOGY REPORT*  Clinical Data: Left-sided numbness.  Elevated blood pressure.  CT HEAD WITHOUT CONTRAST  Technique:  Contiguous axial images were obtained from the base of the skull through the vertex without contrast.  Comparison: None.  Findings: There is no evidence for acute infarction, intracranial hemorrhage, mass lesion, hydrocephalus, or extra-axial fluid.  No cerebral or cerebellar atrophy.  Hypodensity  throughout the subcortical and periventricular white matter likely represents chronic microvascular ischemic change.  No large vessel infarct. Brainstem and cerebellum grossly unremarkable.  Bone windows reveal moderate bony overgrowth anteriorly in the middle cranial fossa and anterior cranial fossa consistent with hyperostosis frontalis interna.  The mastoid air cells are clear.  There is mild ethmoid fluid on the left.  Frontal sinuses are clear. No visible sphenoid or mastoid fluid.  IMPRESSION: No acute intracranial findings.  Bilateral subcortical and deep white matter hypodensity consistent with chronic microvascular ischemic change.   Original Report Authenticated By: Davonna Belling, M.D.    Mr [facility] Wo Contrast  10/10/2012  *RADIOLOGY REPORT*  Clinical Data:  Left sided numbness.  History high blood pressure.  MRI BRAIN WITHOUT CONTRAST MRA HEAD WITHOUT CONTRAST  Technique: Multiplanar, multiecho pulse sequences of the brain and surrounding structures were obtained according to standard protocol without intravenous contrast.  Angiographic images of the head were obtained using MRA technique without contrast.  Comparison: 10/09/2012 CT.  No comparison MR.  MRI HEAD  Findings:  Acute non hemorrhagic infarct at the junction of the posterior limb of the right internal capsule and right thalamus.  No intracranial hemorrhage.  Marked white matter type changes most likely related to result of small vessel disease in this hypertensive patient. Other causes of white matter type changes such as that related to vasculitis, inflammatory process or demyelinating  process felt to be less likely considerations.  No intracranial mass lesion detected on this unenhanced exam.  No hydrocephalus.  IMPRESSION: Acute non hemorrhagic infarct at the junction of the posterior limb of the right internal capsule and right thalamus.  No intracranial hemorrhage.  Marked white matter type changes most likely related to result of  small vessel disease in this hypertensive patient.  MRA HEAD  Findings: High-grade focal stenosis A1 segment right anterior cerebral artery.  Ectatic cavernous segment left internal carotid artery.  Mild narrowing supraclinoid aspect of the internal carotid arteries bilaterally.  Mild to slightly moderate narrowing distal M1 segment left middle cerebral artery.  Middle cerebral artery branch vessel irregularity bilaterally.  Mild narrowing and irregularity involving portions of the vertebral arteries bilaterally with moderate focal stenosis distal right vertebral artery.  Poor delineation of the PICAs and right AICA.  Mild irregularity without high-grade stenosis of the basilar artery.  Moderate to marked tandem stenosis of the superior cerebellar artery bilaterally.  Mild to moderate focal narrowing proximal right posterior cerebral artery.  Mild narrowing proximal left posterior cerebral artery.  No aneurysm or vascular malformation noted.  IMPRESSION: Intracranial atherosclerotic type changes as noted above.  The mild motion degradation may overestimate the degree of stenosis.  This has been made a PRA call report utilizing dashboard call feature.   Original Report Authenticated By: Lacy Duverney, M.D.    Mr Brain Wo Contrast  10/10/2012  *RADIOLOGY REPORT*  Clinical Data:  Left sided numbness.  History high blood pressure.  MRI BRAIN WITHOUT CONTRAST MRA HEAD WITHOUT CONTRAST  Technique: Multiplanar, multiecho pulse sequences of the brain and surrounding structures were obtained according to standard protocol without intravenous contrast.  Angiographic images of the head were obtained using MRA technique without contrast.  Comparison: 10/09/2012 CT.  No comparison MR.  MRI HEAD  Findings:  Acute non hemorrhagic infarct at the junction of the posterior limb of the right internal capsule and right thalamus.  No intracranial hemorrhage.  Marked white matter type changes most likely related to result of small  vessel disease in this hypertensive patient. Other causes of white matter type changes such as that related to vasculitis, inflammatory process or demyelinating process felt to be less likely considerations.  No intracranial mass lesion detected on this unenhanced exam.  No hydrocephalus.  IMPRESSION: Acute non hemorrhagic infarct at the junction of the posterior limb of the right internal capsule and right thalamus.  No intracranial hemorrhage.  Marked white matter type changes most likely related to result of small vessel disease in this hypertensive patient.  MRA HEAD  Findings: High-grade focal stenosis A1 segment right anterior cerebral artery.  Ectatic cavernous segment left internal carotid artery.  Mild narrowing supraclinoid aspect of the internal carotid arteries bilaterally.  Mild to slightly moderate narrowing distal M1 segment left middle cerebral artery.  Middle cerebral artery branch vessel irregularity bilaterally.  Mild narrowing and irregularity involving portions of the vertebral arteries bilaterally with moderate focal stenosis distal right vertebral artery.  Poor delineation of the PICAs and right AICA.  Mild irregularity without high-grade stenosis of the basilar artery.  Moderate to marked tandem stenosis of the superior cerebellar artery bilaterally.  Mild to moderate focal narrowing proximal right posterior cerebral artery.  Mild narrowing proximal left posterior cerebral artery.  No aneurysm or vascular malformation noted.  IMPRESSION: Intracranial atherosclerotic type changes as noted above.  The mild motion degradation may overestimate the degree of stenosis.  This has been made a  PRA call report utilizing dashboard call feature.   Original Report Authenticated By: Lacy Duverney, M.D.    Personally viewed.   Telemetry: NSR, artifact. No AFIB Personally viewed.   EKG:  SR, with PAC's, no ST changes  Cardiac Studies:  ECHO pending  Assessment/Plan:  Principal Problem:  *CVA  (cerebral infarction) Active Problems:  Elevated troponin  Gout  Glaucoma  Obesity  Disturbance of skin sensation  Unspecified transient cerebral ischemia  -Elevated Troponin - rechecking today. If higher will trend. ECHO pending. No symptoms. This troponin elevation is likely secondary to right Thalamic stroke.   -Hyperlipidemia - Started atorvastatin 80mg . LDL 110's. Was 175 a few years ago. Monitor  -Obesity - encourage weight loss  -CVA - MRI report reviewed as well as Dr. Pearlean Brownie and Earl Gala note. Would appreciate neurology guidance on when to initiate BP medication/ control.  ASA.  SKAINS, MARK 10/10/2012, 12:29 PM

## 2012-10-10 NOTE — Evaluation (Signed)
Physical Therapy Evaluation Patient Details Name: Erin Holt MRN: [id number] DOB: [birth year] Today's Date: 10/10/2012 Time: 4098-1191 PT Time Calculation (min): 26 min  PT Assessment / Plan / Recommendation Clinical Impression  pt admitted with L sided numbness and weakness.  Symptoms are slowly resolving and pt is nearing baseline with some L UE weakness and tingling left over.  No further PT needs. No follow up.  D/C from PT.    PT Assessment  Patent does not need any further PT services    Follow Up Recommendations  No PT follow up    Does the patient have the potential to tolerate intense rehabilitation      Barriers to Discharge        Equipment Recommendations  None recommended by PT    Recommendations for Other Services     Frequency      Precautions / Restrictions     Pertinent Vitals/Pain       Mobility  Bed Mobility Bed Mobility: Supine to Sit;Sitting - Scoot to Edge of Bed Supine to Sit: 7: Independent Sitting - Scoot to [place] of Bed: 7: Independent Transfers Transfers: Sit to Stand;Stand to Sit Sit to Stand: 7: Independent Stand to Sit: 7: Independent Ambulation/Gait Ambulation/Gait Assistance: 7: Independent Ambulation Distance (Feet): 300 Feet Assistive device: None Gait Pattern: Within Functional Limits Stairs: Yes Stairs Assistance: 6: Modified independent (Device/Increase time) Stair Management Technique: One rail Right;Alternating pattern;Step to pattern;Forwards;Sideways (step to sideways coming down) Number of Stairs: 9  Wheelchair Mobility Wheelchair Mobility: No Modified Rankin (Stroke Patients Only) Pre-Morbid Rankin Score: No symptoms Modified Rankin: No significant disability    Shoulder Instructions     Exercises     PT Diagnosis:    PT Problem List:   PT Treatment Interventions:     PT Goals    Visit Information  Last PT Received On: 10/10/12 Assistance Needed: +1    Subjective Data  Subjective: I want to see  if you come up with anything. Patient Stated Goal: Independent and back to work   Prior Functioning  Home Living Lives With: Alone Type of Home: House Home Access: Level entry Home Layout: One level Bathroom Shower/Tub: Forensic scientist: Standard Home Adaptive Equipment:  (Handle attached to the tub) Prior Function Level of Independence: Independent Able to Take Stairs?: Yes Driving: Yes Vocation: Full time employment Communication Communication: No difficulties    Cognition  Overall Cognitive Status: Appears within functional limits for tasks assessed/performed Arousal/Alertness: Awake/alert Orientation Level: Appears intact for tasks assessed Behavior During Session: [facility] for tasks performed    Extremity/Trunk Assessment Left Upper Extremity Assessment LUE Sensation: Deficits LUE Sensation Deficits: medial arm elbow to hand tingling Right Lower Extremity Assessment RLE ROM/Strength/Tone: Within functional levels RLE Sensation: WFL - Light Touch RLE Coordination: WFL - gross/fine motor Left Lower Extremity Assessment LLE ROM/Strength/Tone: Within functional levels LLE Sensation: WFL - Light Touch LLE Coordination: WFL - gross/fine motor Trunk Assessment Trunk Assessment: Normal   Balance Standardized Balance Assessment Standardized Balance Assessment: Berg Balance Test Berg Balance Test Sit to Stand: Able to stand without using hands and stabilize independently Standing Unsupported: Able to stand safely 2 minutes Sitting with Back Unsupported but Feet Supported on Floor or Stool: Able to sit safely and securely 2 minutes Stand to Sit: Sits safely with minimal use of hands Transfers: Able to transfer safely, minor use of hands Standing Unsupported with Eyes Closed: Able to stand 10 seconds safely Standing Ubsupported with Feet Together: Able to place  feet together independently and stand 1 minute safely From Standing, Reach Forward with  Outstretched Arm: Can reach confidently >25 cm (10") From Standing Position, Pick up Object from Floor: Able to pick up shoe safely and easily From Standing Position, Turn to Look Behind Over each Shoulder: Looks behind from both sides and weight shifts well Turn 360 Degrees: Able to turn 360 degrees safely in 4 seconds or less Standing Unsupported, Alternately Place Feet on Step/Stool: Able to stand independently and safely and complete 8 steps in 20 seconds Standing Unsupported, One Foot in Front: Able to plae foot ahead of the other independently and hold 30 seconds Standing on One Leg: Able to lift leg independently and hold equal to or more than 3 seconds Total Score: 53   End of Session PT - End of Session Activity Tolerance: Patient tolerated treatment well Patient left: Other (comment) (sitting EOB) Nurse Communication: Mobility status  GP     Aisling Emigh, Eliseo Gum 10/10/2012, 1:19 PM  10/10/2012  [place] Bing, PT [phone] (pager)

## 2012-10-10 NOTE — Progress Notes (Signed)
Assessment/Plan: Principal Problem:  *CVA (cerebral infarction) - suspect we will see CVA on MRI. I told her symptoms may or may not resolve. I indicated that she now has to be aggressive about risk factor modification. It is yet to be determined if she will need antihypertensives, but she has had intermittent high BP in the past (ie, before 2009).  Active Problems:  Elevated troponin  Gout - I will stop colcrys as patient is d/c'ed. I don't want to risk an acute gout flare from being at bedrest.   Glaucoma  Obesity  Disturbance of skin sensation  Unspecified transient cerebral ischemia   Subjective: Still with numbness in left face and left arm. No new symptoms.   I have not seen patient since 01/2008. Tells me that she has been seeing Dr. Azzie Roup about her gout. Has been on allopurinol > 1 year and was told recently she could stop Colcrys but she did not want to do so. I indicated that there was a drug interaction with statins. She said she would be willing to stop it (see comments under A/P)  Objective:  Vital Signs: Filed Vitals:   10/09/12 2200 10/10/12 0000 10/10/12 0200 10/10/12 0400  BP: 156/81 173/94 165/90 191/89  Pulse: 76 62 65 73  Temp: 98.5 F (36.9 C) 98.6 F (37 C) 98.4 F (36.9 C) 98.3 F (36.8 C)  TempSrc: Oral Oral Oral Oral  Resp: 18 20 18 20   Height:      Weight:      SpO2: 97% 97% 100% 96%     EXAM: moving both sides normally. Face symmetric.    Intake/Output Summary (Last 24 hours) at 10/10/12 0651 Last data filed at 10/10/12 0000  Gross per 24 hour  Intake      0 ml  Output    250 ml  Net   -250 ml    Lab Results:  Basename 10/09/12 1742 10/09/12 1001  NA -- 140  K -- 4.0  CL -- 104  CO2 -- 26  GLUCOSE -- 102*  BUN -- 19  CREATININE 0.84 0.89  CALCIUM -- 9.6  MG -- --  PHOS -- --    Basename 10/09/12 1001  AST 18  ALT 11  ALKPHOS 79  BILITOT 0.4  PROT 7.5  ALBUMIN 3.5   No results found for this basename:  LIPASE:2,AMYLASE:2 in the last 72 hours  Basename 10/09/12 1742 10/09/12 1001  WBC 10.2 8.7  NEUTROABS -- 6.2  HGB 12.7 12.1  HCT 39.7 37.2  MCV 85.6 85.7  PLT 258 231    Basename 10/09/12 1244 10/09/12 1003  CKTOTAL -- --  CKMB -- --  CKMBINDEX -- --  TROPONINI 0.92* 0.79*   No components found with this basename: POCBNP:3 No results found for this basename: DDIMER:2 in the last 72 hours No results found for this basename: HGBA1C:2 in the last 72 hours No results found for this basename: CHOL:2,HDL:2,LDLCALC:2,TRIG:2,CHOLHDL:2,LDLDIRECT:2 in the last 72 hours No results found for this basename: TSH,T4TOTAL,FREET3,T3FREE,THYROIDAB in the last 72 hours No results found for this basename: VITAMINB12:2,FOLATE:2,FERRITIN:2,TIBC:2,IRON:2,RETICCTPCT:2 in the last 72 hours  Studies/Results: Ct Head Wo Contrast  10/09/2012  *RADIOLOGY REPORT*  Clinical Data: Left-sided numbness.  Elevated blood pressure.  CT HEAD WITHOUT CONTRAST  Technique:  Contiguous axial images were obtained from the base of the skull through the vertex without contrast.  Comparison: None.  Findings: There is no evidence for acute infarction, intracranial hemorrhage, mass lesion, hydrocephalus, or extra-axial fluid.  No  cerebral or cerebellar atrophy.  Hypodensity throughout the subcortical and periventricular white matter likely represents chronic microvascular ischemic change.  No large vessel infarct. Brainstem and cerebellum grossly unremarkable.  Bone windows reveal moderate bony overgrowth anteriorly in the middle cranial fossa and anterior cranial fossa consistent with hyperostosis frontalis interna.  The mastoid air cells are clear.  There is mild ethmoid fluid on the left.  Frontal sinuses are clear. No visible sphenoid or mastoid fluid.  IMPRESSION: No acute intracranial findings.  Bilateral subcortical and deep white matter hypodensity consistent with chronic microvascular ischemic change.   Original Report  Authenticated By: Davonna Belling, M.D.    Medications: Medications administered in the last 24 hours reviewed.  Current Medication List reviewed.    LOS: 1 day   [facility] Internal Medicine @ P[facility]i Sears [phone]) 10/10/2012, 6:51 AM

## 2012-10-11 DIAGNOSIS — I10 Essential (primary) hypertension: Secondary | ICD-10-CM | POA: Diagnosis present

## 2012-10-11 DIAGNOSIS — I69398 Other sequelae of cerebral infarction: Secondary | ICD-10-CM

## 2012-10-11 MED ORDER — ATORVASTATIN CALCIUM 40 MG PO TABS: 40.0000 mg | ORAL_TABLET | Freq: Every day | ORAL | Status: DC

## 2012-10-11 MED ORDER — AMLODIPINE BESYLATE 5 MG PO TABS
5.0000 mg | ORAL_TABLET | Freq: Every day | ORAL | Status: DC
  Administered 2012-10-11: 5 mg via ORAL
  Filled 2012-10-11: qty 1

## 2012-10-11 MED ORDER — HYDROCHLOROTHIAZIDE 25 MG PO TABS
25.0000 mg | ORAL_TABLET | Freq: Every day | ORAL | Status: DC
  Filled 2012-10-11: qty 1

## 2012-10-11 MED ORDER — ASPIRIN 325 MG PO TABS: 325.0000 mg | ORAL_TABLET | Freq: Every day | ORAL | Status: DC

## 2012-10-11 MED ORDER — AMLODIPINE BESYLATE 5 MG PO TABS: 5.0000 mg | ORAL_TABLET | Freq: Every day | ORAL | Status: DC

## 2012-10-11 NOTE — Progress Notes (Signed)
Assessment/Plan: Principal Problem:  *CVA (cerebral infarction) - MRI confirms stroke. MRA shows a lot of intracerebral vasc disease. Carotids done, she states, but no results available. ?d/c today depending on carotid result, neuro followup comments, etc.  Active Problems:  Elevated troponin - now trending downward.   Gout  Glaucoma  Obesity  Disturbance of skin sensation  Unspecified transient cerebral ischemia  Hypertension, essential - I note that Dr. Jerral Ralph started her on metoprolol at admit. I am not sure that is best med. She will probably need two meds. I would normally think of diuretic in AA woman, but she has gout. Will switch to amlodipine. She will probably need two drugs down the line but will start with one for now.    Subjective: No new symptoms. Eager to go home.   Objective:  Vital Signs: Filed Vitals:   10/10/12 2000 10/10/12 2221 10/11/12 0000 10/11/12 0400  BP: 185/76 186/106 173/114 184/102  Pulse: 64 66 67 69  Temp: 98.6 F (37 C)  97.9 F (36.6 C) 98.1 F (36.7 C)  TempSrc: Oral  Oral Oral  Resp: 16  16 18   Height:      Weight:    119.477 kg (263 lb 6.4 oz)  SpO2: 94%  98% 100%     EXAM: Moves all extremities normally   Intake/Output Summary (Last 24 hours) at 10/11/12 0732 Last data filed at 10/11/12 0400  Gross per 24 hour  Intake    960 ml  Output   1250 ml  Net   -290 ml    Lab Results:  Basename 10/09/12 1742 10/09/12 1001  NA -- 140  K -- 4.0  CL -- 104  CO2 -- 26  GLUCOSE -- 102*  BUN -- 19  CREATININE 0.84 0.89  CALCIUM -- 9.6  MG -- --  PHOS -- --    Basename 10/09/12 1001  AST 18  ALT 11  ALKPHOS 79  BILITOT 0.4  PROT 7.5  ALBUMIN 3.5   No results found for this basename: LIPASE:2,AMYLASE:2 in the last 72 hours  Basename 10/09/12 1742 10/09/12 1001  WBC 10.2 8.7  NEUTROABS -- 6.2  HGB 12.7 12.1  HCT 39.7 37.2  MCV 85.6 85.7  PLT 258 231    Basename 10/10/12 1152 10/09/12 1244 10/09/12 1003  CKTOTAL --  -- --  CKMB -- -- --  CKMBINDEX -- -- --  TROPONINI 0.67* 0.92* 0.79*   No components found with this basename: POCBNP:3 No results found for this basename: DDIMER:2 in the last 72 hours  Basename 10/10/12 0625  HGBA1C 5.8*    Basename 10/10/12 0625  CHOL 190  HDL 48  LDLCALC 111*  TRIG 156*  CHOLHDL 4.0  LDLDIRECT --   No results found for this basename: TSH,T4TOTAL,FREET3,T3FREE,THYROIDAB in the last 72 hours No results found for this basename: VITAMINB12:2,FOLATE:2,FERRITIN:2,TIBC:2,IRON:2,RETICCTPCT:2 in the last 72 hours  Studies/Results: Ct Head Wo Contrast  10/09/2012  *RADIOLOGY REPORT*  Clinical Data: Left-sided numbness.  Elevated blood pressure.  CT HEAD WITHOUT CONTRAST  Technique:  Contiguous axial images were obtained from the base of the skull through the vertex without contrast.  Comparison: None.  Findings: There is no evidence for acute infarction, intracranial hemorrhage, mass lesion, hydrocephalus, or extra-axial fluid.  No cerebral or cerebellar atrophy.  Hypodensity throughout the subcortical and periventricular white matter likely represents chronic microvascular ischemic change.  No large vessel infarct. Brainstem and cerebellum grossly unremarkable.  Bone windows reveal moderate bony overgrowth anteriorly in the middle  cranial fossa and anterior cranial fossa consistent with hyperostosis frontalis interna.  The mastoid air cells are clear.  There is mild ethmoid fluid on the left.  Frontal sinuses are clear. No visible sphenoid or mastoid fluid.  IMPRESSION: No acute intracranial findings.  Bilateral subcortical and deep white matter hypodensity consistent with chronic microvascular ischemic change.   Original Report Authenticated By: Davonna Belling, M.D.    Mr [facility] Wo Contrast  10/10/2012  *RADIOLOGY REPORT*  Clinical Data:  Left sided numbness.  History high blood pressure.  MRI BRAIN WITHOUT CONTRAST MRA HEAD WITHOUT CONTRAST  Technique: Multiplanar,  multiecho pulse sequences of the brain and surrounding structures were obtained according to standard protocol without intravenous contrast.  Angiographic images of the head were obtained using MRA technique without contrast.  Comparison: 10/09/2012 CT.  No comparison MR.  MRI HEAD  Findings:  Acute non hemorrhagic infarct at the junction of the posterior limb of the right internal capsule and right thalamus.  No intracranial hemorrhage.  Marked white matter type changes most likely related to result of small vessel disease in this hypertensive patient. Other causes of white matter type changes such as that related to vasculitis, inflammatory process or demyelinating process felt to be less likely considerations.  No intracranial mass lesion detected on this unenhanced exam.  No hydrocephalus.  IMPRESSION: Acute non hemorrhagic infarct at the junction of the posterior limb of the right internal capsule and right thalamus.  No intracranial hemorrhage.  Marked white matter type changes most likely related to result of small vessel disease in this hypertensive patient.  MRA HEAD  Findings: High-grade focal stenosis A1 segment right anterior cerebral artery.  Ectatic cavernous segment left internal carotid artery.  Mild narrowing supraclinoid aspect of the internal carotid arteries bilaterally.  Mild to slightly moderate narrowing distal M1 segment left middle cerebral artery.  Middle cerebral artery branch vessel irregularity bilaterally.  Mild narrowing and irregularity involving portions of the vertebral arteries bilaterally with moderate focal stenosis distal right vertebral artery.  Poor delineation of the PICAs and right AICA.  Mild irregularity without high-grade stenosis of the basilar artery.  Moderate to marked tandem stenosis of the superior cerebellar artery bilaterally.  Mild to moderate focal narrowing proximal right posterior cerebral artery.  Mild narrowing proximal left posterior cerebral artery.  No  aneurysm or vascular malformation noted.  IMPRESSION: Intracranial atherosclerotic type changes as noted above.  The mild motion degradation may overestimate the degree of stenosis.  This has been made a PRA call report utilizing dashboard call feature.   Original Report Authenticated By: Lacy Duverney, M.D.    Mr Brain Wo Contrast  10/10/2012  *RADIOLOGY REPORT*  Clinical Data:  Left sided numbness.  History high blood pressure.  MRI BRAIN WITHOUT CONTRAST MRA HEAD WITHOUT CONTRAST  Technique: Multiplanar, multiecho pulse sequences of the brain and surrounding structures were obtained according to standard protocol without intravenous contrast.  Angiographic images of the head were obtained using MRA technique without contrast.  Comparison: 10/09/2012 CT.  No comparison MR.  MRI HEAD  Findings:  Acute non hemorrhagic infarct at the junction of the posterior limb of the right internal capsule and right thalamus.  No intracranial hemorrhage.  Marked white matter type changes most likely related to result of small vessel disease in this hypertensive patient. Other causes of white matter type changes such as that related to vasculitis, inflammatory process or demyelinating process felt to be less likely considerations.  No intracranial mass lesion detected  on this unenhanced exam.  No hydrocephalus.  IMPRESSION: Acute non hemorrhagic infarct at the junction of the posterior limb of the right internal capsule and right thalamus.  No intracranial hemorrhage.  Marked white matter type changes most likely related to result of small vessel disease in this hypertensive patient.  MRA HEAD  Findings: High-grade focal stenosis A1 segment right anterior cerebral artery.  Ectatic cavernous segment left internal carotid artery.  Mild narrowing supraclinoid aspect of the internal carotid arteries bilaterally.  Mild to slightly moderate narrowing distal M1 segment left middle cerebral artery.  Middle cerebral artery branch vessel  irregularity bilaterally.  Mild narrowing and irregularity involving portions of the vertebral arteries bilaterally with moderate focal stenosis distal right vertebral artery.  Poor delineation of the PICAs and right AICA.  Mild irregularity without high-grade stenosis of the basilar artery.  Moderate to marked tandem stenosis of the superior cerebellar artery bilaterally.  Mild to moderate focal narrowing proximal right posterior cerebral artery.  Mild narrowing proximal left posterior cerebral artery.  No aneurysm or vascular malformation noted.  IMPRESSION: Intracranial atherosclerotic type changes as noted above.  The mild motion degradation may overestimate the degree of stenosis.  This has been made a PRA call report utilizing dashboard call feature.   Original Report Authenticated By: Lacy Duverney, M.D.    Medications: Medications administered in the last 24 hours reviewed.  Current Medication List reviewed.    LOS: 2 days   [facility] Internal Medicine @ Patsi Sears [phone]) 10/11/2012, 7:32 AM

## 2012-10-11 NOTE — Progress Notes (Signed)
Stroke Team Progress Note  HISTORY Erin Holt is an 61 y.o. female who noted yesterday around 4 PM she had decreased taste in her left tongue along with decreased sensation in the left thenar portion of her hand. This lasted for about 1 hour than subsided. This AM she awoke with no symptoms. Round 8AM she had taken a shower and noted decreased sensation in her left aspect of her face, left arm from elbow to wrist and left great toe which evolved to decreased sensation of the left lateral leg. She denies any weakness, visual problems, speech difficulty, or gait abnormality. Since in the ED her left leg and face have improved. Subjectively she feels her forearm still has decreased sensation but improving. Patient was not a TPA candidate secondary to improving symptoms. She was admitted for further evaluation and treatment.  SUBJECTIVE Still with  Numbness on left side. But improved. No new complaints today. Awaiting 2Decho. OBJECTIVE Most recent Vital Signs: Filed Vitals:   10/10/12 2000 10/10/12 2221 10/11/12 0000 10/11/12 0400  BP: 185/76 186/106 173/114 184/102  Pulse: 64 66 67 69  Temp: 98.6 F (37 C)  97.9 F (36.6 C) 98.1 F (36.7 C)  TempSrc: Oral  Oral Oral  Resp: 16  16 18   Height:      Weight:    119.477 kg (263 lb 6.4 oz)  SpO2: 94%  98% 100%   CBG (last 3)   Basename 10/10/12 1700 10/10/12 1156 10/10/12 0725  GLUCAP 96 106* 101*   IV Fluid Intake:     . sodium chloride 75 mL/hr at 10/10/12 2112   MEDICATIONS    . allopurinol  100 mg Oral QPM  . amLODipine  5 mg Oral Daily  . aspirin  300 mg Rectal Daily   Or  . aspirin  325 mg Oral Daily  . atorvastatin  40 mg Oral q1800  . enoxaparin  40 mg Subcutaneous Q24H  . latanoprost  1 drop Both Eyes QHS  . [COMPLETED] LORazepam  0.5 mg Intravenous Once  . [DISCONTINUED] atorvastatin  10 mg Oral q1800  . [DISCONTINUED] colchicine  0.6 mg Oral QPM  . [DISCONTINUED] hydrochlorothiazide  25 mg Oral Daily  .  [DISCONTINUED] metoprolol tartrate  12.5 mg Oral BID   PRN:  senna-docusate  Diet:  Cardiac thin liquids Activity:  OOB in chair DVT Prophylaxis:  Lovenox 40 mg sq daily   CLINICALLY SIGNIFICANT STUDIES Basic Metabolic Panel:   Lab 10/09/12 1742 10/09/12 1001  NA -- 140  K -- 4.0  CL -- 104  CO2 -- 26  GLUCOSE -- 102*  BUN -- 19  CREATININE 0.84 0.89  CALCIUM -- 9.6  MG -- --  PHOS -- --   Liver Function Tests:   Lab 10/09/12 1001  AST 18  ALT 11  ALKPHOS 79  BILITOT 0.4  PROT 7.5  ALBUMIN 3.5   CBC:   Lab 10/09/12 1742 10/09/12 1001  WBC 10.2 8.7  NEUTROABS -- 6.2  HGB 12.7 12.1  HCT 39.7 37.2  MCV 85.6 85.7  PLT 258 231   Coagulation:   Lab 10/09/12 1001  LABPROT 12.9  INR 0.98   Cardiac Enzymes:   Lab 10/10/12 1152 10/09/12 1244 10/09/12 1003  CKTOTAL -- -- --  CKMB -- -- --  CKMBINDEX -- -- --  TROPONINI 0.67* 0.92* 0.79*   Urinalysis: No results found for this basename: COLORURINE:2,APPERANCEUR:2,LABSPEC:2,PHURINE:2,GLUCOSEU:2,HGBUR:2,BILIRUBINUR:2,KETONESUR:2,PROTEINUR:2,UROBILINOGEN:2,NITRITE:2,LEUKOCYTESUR:2 in the last 168 hours  Lipid Panel    Component Value Date/Time  CHOL 190 10/10/2012 0625   TRIG 156* 10/10/2012 0625   HDL 48 10/10/2012 0625   CHOLHDL 4.0 10/10/2012 0625   VLDL 31 10/10/2012 0625   LDLCALC 111* 10/10/2012 0625   HgbA1C  Lab Results  Component Value Date   HGBA1C 5.8* 10/10/2012    Urine Drug Screen:     Component Value Date/Time   LABOPIA NONE DETECTED 10/09/2012 1033   COCAINSCRNUR NONE DETECTED 10/09/2012 1033   LABBENZ NONE DETECTED 10/09/2012 1033   AMPHETMU NONE DETECTED 10/09/2012 1033   THCU NONE DETECTED 10/09/2012 1033   LABBARB NONE DETECTED 10/09/2012 1033    Alcohol Level: No results found for this basename: ETH:2 in the last 168 hours  CT of the brain  10/09/2012  No acute intracranial findings.  Bilateral subcortical and deep white matter hypodensity consistent with chronic  microvascular ischemic change.  MRI of the brain  Small Rt lateral thalamic infarct  MRA of the brain  No large vessel stenosis or occlusion  2D Echocardiogram  pending  Carotid Doppler  Right: 60-79% internal carotid artery stenosis. Left: No evidence of hemodynamically significant internal carotid artery stenosis. Bilateral: Vertebral artery flow is antegrade.   EKG  normal sinus rhythm.   Therapy Recommendations PT-none,   Physical Exam  Obese middle aged african american lady not in distress.Awake alert. Afebrile. Head is nontraumatic. Neck is supple without bruit. Hearing is normal. Cardiac exam no murmur or gallop. Lungs are clear to auscultation. Distal pulses are well felt.  Neurological Exam : Awake alert oriented x 3 normal speech and language.Fundi not visualized. Vision acuity and fields appear normal. No face asymmetry. Tongue midline. No drift. Mild diminished fine finger movements on left. Orbits right over left upper extremity. Mild left grip weak.. Normal sensation . Normal coordination.   ASSESSMENT [name] is a 61 y.o. female presenting with decreased sensation on her left side. Imaging confirms a right thalamic infarct. Infarct felt to be thrombotic secondary to  Small vessel disease.  Work up completed, await 2D results.  On no antiplatelet prior to admission. Now on aspirin 325 mg orally every day for secondary stroke prevention. Patient with resultant mild left UE weakness, which is improving, and numbness .   Elevated troponin, cardiology consulted Hyperlipidemia, LDL 111, now, on atorvastatin 80mg , goal LDL < 100 Hypertension. Agree with new amlodipine per Dr. Earl Gala Morbid obesity, Body mass index is 45.21 kg/(m^2). A1c 5.8 Right carotid stenosis, asymptomatic  Hospital day # 2  TREATMENT/PLAN  Continue aspirin 325 mg orally every day for secondary stroke prevention.  F/u 2D, Dr. Anne Fu said he would follow up  [facility] for initiation of more  aggressive BP management Stroke Service will sign off. Follow up with Dr. Pearlean Brownie, Stroke Clinic, in 2 months.  Dr. Pearlean Brownie discussed case with patient as well as with Dr. Ebony Cargo, MSN, RN, ANVP-BC, ANP-BC, GNP-BC [facility] Pager: [phone] 10/11/2012 8:23 AM   I have personally examined this patient, reviewed pertinent data and developed the plan of care. I agree with above.   Delia Heady, MD Medical Director [facility] Pager: [phone] 10/11/2012 8:23 AM

## 2012-10-11 NOTE — Discharge Summary (Signed)
Physician Discharge Summary  NAME:Erin Holt  XBJ:[id number]  DOB: 1951/10/22   Admit date: 10/09/2012 Discharge date: 10/11/2012  Admitting Diagnosis: stroke  Discharge Diagnoses:  Principal Problem:  *CVA (cerebral infarction) Active Problems:  Altered sensation due to recent cerebral infarction  Elevated troponin - thought related to stroke.   Gout  Glaucoma  Obesity  Disturbance of skin sensation  Unspecified transient cerebral ischemia  Hypertension, essential   Discharge Condition: improved  Hospital Course: Patient admitted with apparent stroke involving right brain (left face and arm numbness). She was monitored on telemetry and there were no abnormalities in rhythm. CT head was negative. She underwent further workup with MRI showing right internal capsule/thalamic stroke. She had moderate intracerebral atherosclerosis. She had a 6-79% RICA stenosis on doppler. Echo showed no wall motion abnormality. EF normal and no diastolic dysfunction.   The patient has not been seen in the office for over four years. At that time, she had borderline pressure. It now appears that she has developed more overt evidence of HTN, and she was started on meds listed. For secondary prevention, atorvastatin and ASA were added as well.   Consults: cardiology, neurology  Disposition: Final discharge disposition not confirmed  Discharge Orders    Future Orders Please Complete By Expires   Diet - low sodium heart healthy      Increase activity slowly          Medication List     As of 10/11/2012  1:45 PM    STOP taking these medications         colchicine 0.6 MG tablet      TAKE these medications         allopurinol 100 MG tablet   Commonly known as: ZYLOPRIM   Take 100 mg by mouth every evening.      amLODipine 5 MG tablet   Commonly known as: NORVASC   Take 1 tablet (5 mg total) by mouth daily.      aspirin 325 MG tablet   Take 1 tablet (325 mg total) by mouth daily.       atorvastatin 40 MG tablet   Commonly known as: LIPITOR   Take 1 tablet (40 mg total) by mouth daily at 6 PM.      latanoprost 0.005 % ophthalmic solution   Commonly known as: XALATAN   Place 1 drop into both eyes at bedtime.           Follow-up Information    Follow up with Gates Rigg, MD. Schedule an appointment as soon as possible for a visit in 2 months. (stroke clinic)    Contact information:   [address], [address] [facility] [place] [address] [phone]       Follow up with Darnelle Bos, MD. (we will call you to arrange followup within the next week)    Contact information:   [address], [facility] AND [place] [address] [phone]          Things to follow up in the outpatient setting: followup on BP  Time coordinating discharge: 25 minutes including medication reconciliation, transmission of prescriptions to pharmacy, preparation of discharge papers, and discussion with patient    Signed: Darnelle Bos 10/11/2012, 1:45 PM

## 2012-10-11 NOTE — Progress Notes (Signed)
Pt discharged to home per MD order. Pt received and reviewed all discharge instructions and medication information including follow-up appointments and prescriptions.  Pt alert and oriented at discharge with no complaints of pain.  Pt reviewed stroke symptoms prior to discharge. Pt escorted to taxi service via wheelchair by RN. [name]

## 2012-10-11 NOTE — Progress Notes (Signed)
Subjective:  No CP, no SOB. Improved stroke symptoms.   Objective:  Vital Signs in the last 24 hours: Temp:  [97.6 F (36.4 C)-98.6 F (37 C)] 97.6 F (36.4 C) (12/12 0800) Pulse Rate:  [64-72] 72  (12/12 0800) Resp:  [16-20] 18  (12/12 0800) BP: (154-186)/(76-114) 171/78 mmHg (12/12 0800) SpO2:  [94 %-100 %] 100 % (12/12 0800) Weight:  [119.477 kg (263 lb 6.4 oz)] 119.477 kg (263 lb 6.4 oz) (12/12 0400)  Intake/Output from previous day: 12/11 0701 - 12/12 0700 In: 1080 [P.O.:1080] Out: 1500 [Urine:1500]   Physical Exam: General: Well developed, well nourished, in no acute distress. Head:  Normocephalic and atraumatic. Lungs: Clear to auscultation and percussion. Heart: Normal S1 and S2.  No murmur, rubs or gallops.  Abdomen: soft, non-tender, positive bowel sounds. Extremities: No clubbing or cyanosis. No edema. Neurologic: Alert and oriented x 3.    Lab Results:  Basename 10/09/12 1742 10/09/12 1001  WBC 10.2 8.7  HGB 12.7 12.1  PLT 258 231    Basename 10/09/12 1742 10/09/12 1001  NA -- 140  K -- 4.0  CL -- 104  CO2 -- 26  GLUCOSE -- 102*  BUN -- 19  CREATININE 0.84 0.89    Basename 10/10/12 1152 10/09/12 1244  TROPONINI 0.67* 0.92*   Hepatic Function Panel  Basename 10/09/12 1001  PROT 7.5  ALBUMIN 3.5  AST 18  ALT 11  ALKPHOS 79  BILITOT 0.4  BILIDIR --  IBILI --    Basename 10/10/12 0625  CHOL 190   No results found for this basename: PROTIME in the last 72 hours  Imaging: RIght Thalamic stroke.     Telemetry: NSR with PAC's Personally viewed.   ECHO:  Pending (made phone call to echo lab to ensure that it is on the list to get done.)  Carotid duplex completed.  Preliminary report: Right: 60-79% internal carotid artery stenosis. Left: No evidence of hemodynamically significant internal carotid artery stenosis. Bilateral: Vertebral artery flow is antegrade.  CESTONE, HELENE, RVT  10/10/2012, 1:23 PM     Assessment/Plan:   Principal Problem:  *CVA (cerebral infarction) Active Problems:  Elevated troponin  Gout  Glaucoma  Obesity  Disturbance of skin sensation  Unspecified transient cerebral ischemia  Hypertension, essential  CVA - carotid disease right 60-79% (prelim) - If believed to be >70% and on ipsilateral side (right) she may benefit from CEA. Would like to hear neruo or Dr. Earl Gala comments on this.   Troponin - trending down. Reassuring. Await ECHO.   HTN - Dr. Earl Gala has started amlodipine.   Obesity - Wt. Loss.   HL - statin.    Charmain Diosdado 10/11/2012, 8:45 AM

## 2013-12-06 ENCOUNTER — Other Ambulatory Visit: Payer: Self-pay | Admitting: Internal Medicine

## 2013-12-06 ENCOUNTER — Ambulatory Visit
Admission: RE | Admit: 2013-12-06 | Discharge: 2013-12-06 | Disposition: A | Discharge status: Home / Self Care | Payer: 59 | Source: Ambulatory Visit | Attending: Internal Medicine | Admitting: Internal Medicine

## 2013-12-06 DIAGNOSIS — R0609 Other forms of dyspnea: Secondary | ICD-10-CM

## 2013-12-06 DIAGNOSIS — R0989 Other specified symptoms and signs involving the circulatory and respiratory systems: Principal | ICD-10-CM

## 2013-12-09 ENCOUNTER — Encounter: Payer: Self-pay | Admitting: *Deleted

## 2013-12-09 ENCOUNTER — Encounter (INDEPENDENT_AMBULATORY_CARE_PROVIDER_SITE_OTHER): Payer: 59

## 2013-12-09 ENCOUNTER — Ambulatory Visit (INDEPENDENT_AMBULATORY_CARE_PROVIDER_SITE_OTHER): Payer: 59 | Admitting: Cardiology

## 2013-12-09 ENCOUNTER — Encounter: Payer: Self-pay | Admitting: Cardiology

## 2013-12-09 VITALS — BP 143/82 | HR 90 | Ht 65.0 in | Wt 238.0 lb

## 2013-12-09 DIAGNOSIS — I6529 Occlusion and stenosis of unspecified carotid artery: Secondary | ICD-10-CM

## 2013-12-09 DIAGNOSIS — I10 Essential (primary) hypertension: Secondary | ICD-10-CM

## 2013-12-09 DIAGNOSIS — I4891 Unspecified atrial fibrillation: Secondary | ICD-10-CM

## 2013-12-09 DIAGNOSIS — I480 Paroxysmal atrial fibrillation: Secondary | ICD-10-CM | POA: Insufficient documentation

## 2013-12-09 MED ORDER — RIVAROXABAN 20 MG PO TABS: 20.0000 mg | ORAL_TABLET | Freq: Every day | ORAL | Status: DC

## 2013-12-09 MED ORDER — DILTIAZEM HCL ER 120 MG PO CP24: 120.0000 mg | ORAL_CAPSULE | Freq: Every day | ORAL | Status: DC

## 2013-12-09 NOTE — Patient Instructions (Addendum)
Please take Cardizem 30 mg 4 times a day until you run out then switch to 120 mg once a day. Stop Amlodipine. Continue all other medications as listed.  Your physician has requested that you have a carotid duplex. This test is an ultrasound of the carotid arteries in your neck. It looks at blood flow through these arteries that supply the brain with blood. Allow one hour for this exam. There are no restrictions or special instructions.  Your physician has recommended that you wear a holter monitor for 24 hours. Holter monitors are medical devices that record the heart's electrical activity. Doctors most often use these monitors to diagnose arrhythmias. Arrhythmias are problems with the speed or rhythm of the heartbeat. The monitor is a small, portable device. You can wear one while you do your normal daily activities. This is usually used to diagnose what is causing palpitations/syncope (passing out).  You will be scheduled for an out-patient cardioversion in 3 weeks. (around March 2)  Further follow up will be scheduled after your cardioversion.   Electrical Cardioversion Electrical cardioversion is the delivery of a jolt of electricity to change the rhythm of the heart. Sticky patches or metal paddles are placed on the chest to deliver the electricity from a device. This is done to restore a normal rhythm. A rhythm that is too fast or not regular keeps the heart from pumping well. Electrical cardioversion is done in an emergency if:   There is low or no blood pressure as a result of the heart rhythm.   Normal rhythm must be restored as fast as possible to protect the brain and heart from further damage.   It may save a life. Cardioversion may be done for heart rhythms that are not immediately life-threatening, such as atrial fibrillation or flutter, in which:   The heart is beating too fast or is not regular.   Medicine to change the rhythm has not worked.   It is safe to wait in  order to allow time for preparation.  Symptoms of the abnormal rhythm are bothersome.  The risk of stroke and other serious complications can be reduced. LET YOUR CAREGIVER KNOW ABOUT:   All medicines you are taking, including vitamins, herbs, eye drops, creams, and over-the-counter medicines.   Previous problems you or members of your family have had with the use of anesthetics.   Any blood disorders you have.   Previous surgeries you have had.   Medical conditions you have. RISKS AND COMPLICATIONS  Generally, this is a safe procedure. However, as with any procedure, complications can occur. Possible complications include:   Breathing problems related to the anesthetic used.  Cardiac arrest This risk is rare.  A blood clot that breaks free and travels to other parts of your body. This could cause a stroke or other problems. The risk of this is lowered by use of blood thinning medicine (anticoagulant) prior to the procedure. BEFORE THE PROCEDURE   You may have tests to detect blood clots in your heart and evaluate heart function.  You may start taking anticoagulants so your blood does not clot as easily.   Medicines may be given to help stabilize your heart rate and rhythm. PROCEDURE  You will be given medicine through an IV tube to reduce discomfort and make you sleepy (sedative).   An electrical shock will be delivered. AFTER THE PROCEDURE Your heart rhythm will be watched to make sure it does not change.You may be able to go home  within a few hours.  Document Released: 10/07/2002 Document Revised: 08/07/2013 Document Reviewed: 05/01/2013 [facility] Patient Information 2014 [place].

## 2013-12-09 NOTE — Progress Notes (Signed)
Patient ID: Erin Holt, female   DOB: [birth year], 63 y.o.   MRN: [id number] E-Cardio 24 hour holter monitor applied to patient.

## 2013-12-09 NOTE — Progress Notes (Signed)
HPI The patient presents as a new patient. She has no prior cardiac history. She felt fatigued on Thursday. She did not feel her heart racing. On Friday she was very tired and short of breath. She presented to see Horton Finer, MD and was noted to be in atrial fibrillation with rapid rate.  She was started on oral Cardizem and Xarelto. She didn't do as much work over the weekend she typically would have gone but she seemed to be less fatigued. She's not had any presyncope or syncope. She denies any chest pressure, neck or arm discomfort. She's had no PND or orthopnea or further dyspnea. 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 I did review old records from a previous CVA in 2013. The etiology of this was not clear. There was some atherosclerotic disease. She also had 60-79% right carotid stenosis in 2013 I don't believe she's had followup Dopplers. However, there was no mention of thromboembolism. An echocardiogram demonstrated some mild pulmonary hypertension with some mild left atrial enlargement but otherwise was unremarkable at that time.   No Known Allergies  Current Outpatient Prescriptions  Medication Sig Dispense Refill  . allopurinol (ZYLOPRIM) 100 MG tablet Take 100 mg by mouth every evening.      Marland Kitchen AMLODIPINE BESYLATE PO Take 10 mg by mouth daily.      Marland Kitchen aspirin 325 MG tablet Take 1 tablet (325 mg total) by mouth daily.      Marland Kitchen atorvastatin (LIPITOR) 40 MG tablet Take 1 tablet (40 mg total) by mouth daily at 6 PM.      . latanoprost (XALATAN) 0.005 % ophthalmic solution Place 1 drop into both eyes at bedtime.       No current facility-administered medications for this visit.    Past Medical History  Diagnosis Date  . Hypertension   . Gout   . Stroke 10/09/2012    probably/notes 10/09/2012"numbness & tingling left face and left arm" (10/09/2012)  . Arthritis     "left knee" (10/09/2012)  . Glaucoma (increased eye pressure)     "both eyes"  (10/09/2012)    Past Surgical History  Procedure Laterality Date  . Knee arthroscopy w/ debridement  2011    "left; for arthritis" (10/09/2012)  . Breast biopsy  1980's    "left; it wasn't anything" (10/09/2012)    Family History  Problem Relation Age of Onset  . Heart failure Mother 47    Died  . Diabetes Mother   . Diabetes Father   . CAD Father 24    History   Social History  . Marital Status: Unknown    Spouse Name: N/A    Number of Children: N/A  . Years of Education: N/A   Occupational History  . Not on file.   Social History Main Topics  . Smoking status: Never Smoker   . Smokeless tobacco: Never Used  . Alcohol Use: No  . Drug Use: No  . Sexual Activity: No   Other Topics Concern  . Not on file   Social History Narrative   Lives alone.    ROS:  As stated in the HPI and negative for all other systems.   PHYSICAL EXAM BP 143/82  Pulse 90  Ht 5\' 5"  (1.651 m)  Wt 238 lb (107.956 kg)  BMI 39.61 kg/m2 GENERAL:  Well appearing HEENT:  Pupils equal round and reactive, fundi not visualized, oral mucosa unremarkable NECK:  No jugular venous distention, waveform within normal  limits, carotid upstroke brisk and symmetric, no bruits, no thyromegaly LYMPHATICS:  No cervical, inguinal adenopathy LUNGS:  Clear to auscultation bilaterally BACK:  No CVA tenderness CHEST:  Unremarkable HEART:  PMI not displaced or sustained,S1 and S2 within normal limits, no S3, no clicks, no rubs, no murmurs ABD:  Flat, positive bowel sounds normal in frequency in pitch, no bruits, no rebound, no guarding, no midline pulsatile mass, no hepatomegaly, no splenomegaly EXT:  2 plus pulses throughout, no edema, no cyanosis no clubbing SKIN:  No rashes no nodules NEURO:  Cranial nerves II through XII grossly intact, motor grossly intact throughout PSYCH:  Cognitively intact, oriented to person place and time   EKG:  Atrial fibrillation, rate 109,axis leftward, intervals within  normal limits, poor anterior R wave progression, baseline artifact, no acute ST-T wave changes  12/06/13  ASSESSMENT AND PLAN  ATRIAL FIB:  I will increase her Cardizem to 4 times daily. When she runs out of her current prescription she can switch to Cardizem CD 120 mg daily. She will continue on blood thinners. I will followup on labs and her thyroid study which was done at [facility].  We discussed the mechanism of fibrillation. I will plan cardioversion in 3 weeks. We will place a 24-hour Holter to make sure that persistent fibrillation.    HTN:  This will be treated in the context of treating her fibrillation.   I will stop her Norvasc.  CAROTID DOPPLER:  She will need follow up Doppler.

## 2013-12-10 ENCOUNTER — Ambulatory Visit (HOSPITAL_COMMUNITY): Payer: 59 | Attending: Cardiology

## 2013-12-10 DIAGNOSIS — Z8673 Personal history of transient ischemic attack (TIA), and cerebral infarction without residual deficits: Secondary | ICD-10-CM | POA: Insufficient documentation

## 2013-12-10 DIAGNOSIS — I6529 Occlusion and stenosis of unspecified carotid artery: Secondary | ICD-10-CM | POA: Insufficient documentation

## 2013-12-10 DIAGNOSIS — I2510 Atherosclerotic heart disease of native coronary artery without angina pectoris: Secondary | ICD-10-CM | POA: Insufficient documentation

## 2013-12-10 DIAGNOSIS — I10 Essential (primary) hypertension: Secondary | ICD-10-CM | POA: Insufficient documentation

## 2013-12-10 DIAGNOSIS — I4891 Unspecified atrial fibrillation: Secondary | ICD-10-CM | POA: Insufficient documentation

## 2013-12-10 DIAGNOSIS — I658 Occlusion and stenosis of other precerebral arteries: Secondary | ICD-10-CM | POA: Insufficient documentation

## 2013-12-13 ENCOUNTER — Telehealth: Payer: Self-pay | Admitting: Cardiology

## 2013-12-13 NOTE — Telephone Encounter (Signed)
Left message of results on private voicemail.  Requested she call back with any questions

## 2013-12-13 NOTE — Telephone Encounter (Signed)
New message    Calling for test results  

## 2014-01-01 ENCOUNTER — Telehealth: Payer: Self-pay | Admitting: *Deleted

## 2014-01-01 NOTE — Telephone Encounter (Signed)
Per Dr Percival Spanish - after reviewing monitor results pt needs to increase her Cardizem to 180 mg once a day d/t increased HR with mild activity.  The monitor demonstrates At Fib.

## 2014-01-03 NOTE — Telephone Encounter (Signed)
NA at home number - fast busy at work number - will continue to attempt to contact.

## 2014-01-03 NOTE — Telephone Encounter (Signed)
Pt also to ready to be scheduled for her cardioversion

## 2014-01-03 NOTE — Telephone Encounter (Signed)
Left message for pt that I will call back on Monday

## 2014-01-03 NOTE — Telephone Encounter (Signed)
Follow Up ° °Pt returned call//  °

## 2014-01-06 ENCOUNTER — Encounter: Payer: Self-pay | Admitting: *Deleted

## 2014-01-06 MED ORDER — DILTIAZEM HCL ER 180 MG PO CP24: 180.0000 mg | ORAL_CAPSULE | Freq: Every day | ORAL | Status: DC

## 2014-01-06 NOTE — Telephone Encounter (Signed)
Pt has been scheduled for an out pt cardioversion on 3/19 at 10 am with Dr Candee Furbish.  Reviewed instructions with pt via phone.  Will mail a copy of instructions to her home address.  Left message for pt of time to be at hospital and to call back if further questions.

## 2014-01-06 NOTE — Telephone Encounter (Signed)
Lm to cb to increase cardizem and schedule out pt cardioversion

## 2014-01-10 ENCOUNTER — Telehealth: Payer: Self-pay | Admitting: Cardiology

## 2014-01-10 ENCOUNTER — Other Ambulatory Visit: Payer: Self-pay | Admitting: Cardiology

## 2014-01-10 DIAGNOSIS — I4891 Unspecified atrial fibrillation: Secondary | ICD-10-CM

## 2014-01-10 NOTE — Telephone Encounter (Signed)
error 

## 2014-01-13 ENCOUNTER — Telehealth: Payer: Self-pay | Admitting: *Deleted

## 2014-01-13 ENCOUNTER — Telehealth: Payer: Self-pay | Admitting: Cardiology

## 2014-01-13 NOTE — Telephone Encounter (Signed)
Reviewed instructions with pt again.  She still has not received her instructions that where sent 01/06/2014.  I did explain to her the process for mail being that from this office it goes to the [facility] office and then to the main post office, which of course leads to delays.  She stated understanding of instructions.

## 2014-01-13 NOTE — Telephone Encounter (Signed)
PA to [facility] for [facility]

## 2014-01-13 NOTE — Telephone Encounter (Signed)
xarelto approved through [employer] [facility]

## 2014-01-13 NOTE — Telephone Encounter (Signed)
New message    Patient calling saying she has not receive paperwork for upcoming procedure on Thursday.

## 2014-01-13 NOTE — Telephone Encounter (Signed)
Letter of instructions for cardioversion was mailed to pt's home address 01/06/2014.  Will review instructions with her again via telephone.

## 2014-01-15 ENCOUNTER — Encounter (HOSPITAL_COMMUNITY): Payer: Self-pay | Admitting: Pharmacy Technician

## 2014-01-16 ENCOUNTER — Encounter (HOSPITAL_COMMUNITY): Payer: Self-pay | Admitting: Certified Registered"

## 2014-01-16 ENCOUNTER — Ambulatory Visit (HOSPITAL_COMMUNITY)
Admission: RE | Admit: 2014-01-16 | Discharge: 2014-01-16 | Disposition: A | Discharge status: Home / Self Care | Payer: 59 | Source: Ambulatory Visit | Attending: Cardiology | Admitting: Cardiology

## 2014-01-16 ENCOUNTER — Encounter (HOSPITAL_COMMUNITY): Payer: Self-pay | Admitting: *Deleted

## 2014-01-16 ENCOUNTER — Encounter (HOSPITAL_COMMUNITY)
Admission: RE | Disposition: A | Discharge status: Home / Self Care | Payer: Self-pay | Source: Ambulatory Visit | Attending: Cardiology

## 2014-01-16 DIAGNOSIS — I4891 Unspecified atrial fibrillation: Secondary | ICD-10-CM | POA: Insufficient documentation

## 2014-01-16 DIAGNOSIS — Z5309 Procedure and treatment not carried out because of other contraindication: Secondary | ICD-10-CM | POA: Insufficient documentation

## 2014-01-16 SURGERY — CANCELLED PROCEDURE

## 2014-01-16 MED ORDER — SODIUM CHLORIDE 0.9 % IV SOLN: INTRAVENOUS | Status: DC

## 2014-01-16 NOTE — Progress Notes (Signed)
No cardioversion needed. In NSR.  Will have follow up set up with Dr. Percival Spanish to discuss PAF. Spoke with patient.

## 2014-01-28 ENCOUNTER — Encounter: Payer: Self-pay | Admitting: *Deleted

## 2014-02-10 ENCOUNTER — Encounter: Payer: 59 | Admitting: Physician Assistant

## 2014-02-10 DIAGNOSIS — I6529 Occlusion and stenosis of unspecified carotid artery: Secondary | ICD-10-CM | POA: Insufficient documentation

## 2014-02-10 DIAGNOSIS — E785 Hyperlipidemia, unspecified: Secondary | ICD-10-CM | POA: Insufficient documentation

## 2014-02-10 NOTE — Progress Notes (Signed)
This encounter was created in error - please disregard.

## 2014-02-12 ENCOUNTER — Encounter: Payer: Self-pay | Admitting: Physician Assistant

## 2014-03-04 ENCOUNTER — Ambulatory Visit (INDEPENDENT_AMBULATORY_CARE_PROVIDER_SITE_OTHER): Payer: 59 | Admitting: Physician Assistant

## 2014-03-04 ENCOUNTER — Encounter: Payer: Self-pay | Admitting: Physician Assistant

## 2014-03-04 VITALS — BP 148/90 | HR 62 | Ht 65.0 in | Wt 249.0 lb

## 2014-03-04 DIAGNOSIS — I6529 Occlusion and stenosis of unspecified carotid artery: Secondary | ICD-10-CM

## 2014-03-04 DIAGNOSIS — I4891 Unspecified atrial fibrillation: Secondary | ICD-10-CM

## 2014-03-04 DIAGNOSIS — E785 Hyperlipidemia, unspecified: Secondary | ICD-10-CM

## 2014-03-04 DIAGNOSIS — I480 Paroxysmal atrial fibrillation: Secondary | ICD-10-CM

## 2014-03-04 DIAGNOSIS — I10 Essential (primary) hypertension: Secondary | ICD-10-CM

## 2014-03-04 NOTE — Patient Instructions (Signed)
Your physician recommends that you schedule a follow-up appointment in: 3 MONTHS WITH DR. HOCHREIN  NO CHANGES WERE MADE TODAY

## 2014-03-04 NOTE — Progress Notes (Signed)
[place], [place], [place]  [address] Phone: [phone] Fax:  [phone]  Date:  03/04/2014   ID:  Erin Holt, DOB 26-Apr-1951, MRN [id number]  PCP:  Horton Finer, MD  Cardiologist:  Dr. Minus Breeding      History of Present Illness: Erin Holt is a 63 y.o. female with a history of HTN, prior stroke and recently diagnosed atrial fibrillation. She initially saw Dr. Percival Spanish in 12/2013.  She had been placed on diltiazem for rate control and Xarelto for anticoagulation.  She presented to the hospital on 01/16/14 after at least 3 weeks of uninterrupted anticoagulation for planned cardioversion. However, the patient was in NSR.  She is feeling well. She denies chest pain, shortness of breath, syncope, orthopnea, PND. She has chronic left lower extremity edema since her stroke. This is unchanged.  Studies:   - Echo (09/2012):  Mild LVH, EF 55-65%, Gr 1 DD, MAC, mild MR, mild to mod LAE, mild RVE, PASP 44 mmHg  - Carotid US (12/10/13):  R 60-79%; L 1-39% - f/u 6 mos   Recent Labs: No results found for requested labs within last 365 days.  Wt Readings from Last 3 Encounters:  03/04/14 249 lb (112.946 kg)  12/09/13 238 lb (107.956 kg)  10/11/12 263 lb 6.4 oz (119.477 kg)     Past Medical History  Diagnosis Date  . Hypertension   . Gout   . Arthritis     "left knee" (10/09/2012)  . Glaucoma (increased eye pressure)     "both eyes" (10/09/2012)  . Hx of echocardiogram     a. Echo (09/2012):  Mild LVH, EF 55-65%, Gr 1 DD, MAC, mild MR, mild to mod LAE, mild RVE, PASP 44 mmHg  . Atrial fibrillation   . Carotid artery occlusion     a. Carotid US (12/10/13):  R 60-79%; L 1-39% - f/u 6 mos  . Stroke 10/09/2012    Current Outpatient Prescriptions  Medication Sig Dispense Refill  . allopurinol (ZYLOPRIM) 100 MG tablet Take 100 mg by mouth every evening.      Marland Kitchen atorvastatin (LIPITOR) 40 MG tablet Take 1 tablet (40 mg total) by mouth daily at 6 PM.       . diltiazem (DILACOR XR) 180 MG 24 hr capsule Take 1 capsule (180 mg total) by mouth daily.  30 capsule  6  . latanoprost (XALATAN) 0.005 % ophthalmic solution Place 1 drop into both eyes at bedtime.      . Rivaroxaban (XARELTO) 20 MG TABS tablet Take 1 tablet (20 mg total) by mouth daily with supper.  30 tablet  6   No current facility-administered medications for this visit.    Allergies:   Review of patient's allergies indicates no known allergies.   Social History:  The patient  reports that she has never smoked. She has never used smokeless tobacco. She reports that she does not drink alcohol or use illicit drugs.   Family History:  The patient's family history includes CAD (age of onset: 92) in her father; Diabetes in her father and mother; Heart failure (age of onset: 40) in her mother.   ROS:  Please see the history of present illness.   No bleeding problems.   All other systems reviewed and negative.   PHYSICAL EXAM: VS:  BP 148/90  Pulse 62  Ht 5\' 5"  (1.651 m)  Wt 249 lb (112.946 kg)  BMI 41.44 kg/m2 Well nourished, well developed, in no acute  distress HEENT: normal Neck: no JVD Cardiac:  normal S1, S2; RRR; 1/6 systolic murmurat the RUSB Lungs:  clear to auscultation bilaterally, no wheezing, rhonchi or rales Abd: soft, nontender, no hepatomegaly Ext: no edema Skin: warm and dry Neuro:  CNs 2-12 intact, no focal abnormalities noted  EKG:  NSR, HR 62, nonspecific ST-T wave changes     ASSESSMENT AND PLAN:  1. Paroxysmal atrial fibrillation: She is maintaining NSR.  CHADS2-VASc=5.  She requires long-term anticoagulation. She was fairly asymptomatic while in atrial fibrillation. If she has recurrent atrial fibrillation, she will likely require antiarrhythmic drug therapy. 2. Carotid stenosis: Followup carotid US planned 05/2014. 3. Hypertension, essential: BP elevated. She tells me that this is usually much better controlled. She will continue to monitor. 4. HLD  (hyperlipidemia): Continue statin. 5. Disposition: Follow up with Dr. Percival Spanish in 3 months.  Signed, Richardson Dopp, PA-C  03/04/2014 9:12 AM

## 2014-08-01 ENCOUNTER — Other Ambulatory Visit: Payer: Self-pay | Admitting: *Deleted

## 2014-08-01 MED ORDER — DILTIAZEM HCL ER 180 MG PO CP24: 180.0000 mg | ORAL_CAPSULE | Freq: Every day | ORAL | Status: DC

## 2014-08-12 ENCOUNTER — Other Ambulatory Visit: Payer: Self-pay | Admitting: Cardiology

## 2014-08-13 ENCOUNTER — Other Ambulatory Visit: Payer: Self-pay | Admitting: *Deleted

## 2014-08-13 MED ORDER — RIVAROXABAN 20 MG PO TABS: 20.0000 mg | ORAL_TABLET | Freq: Every day | ORAL | Status: DC

## 2014-09-01 ENCOUNTER — Other Ambulatory Visit: Payer: Self-pay | Admitting: Cardiology

## 2014-09-03 ENCOUNTER — Other Ambulatory Visit: Payer: Self-pay | Admitting: Cardiology

## 2014-09-05 ENCOUNTER — Ambulatory Visit (INDEPENDENT_AMBULATORY_CARE_PROVIDER_SITE_OTHER): Payer: 59 | Admitting: Cardiology

## 2014-09-05 ENCOUNTER — Encounter: Payer: Self-pay | Admitting: Cardiology

## 2014-09-05 VITALS — BP 154/100 | HR 112 | Ht 64.0 in | Wt 244.9 lb

## 2014-09-05 DIAGNOSIS — I6523 Occlusion and stenosis of bilateral carotid arteries: Secondary | ICD-10-CM

## 2014-09-05 DIAGNOSIS — I480 Paroxysmal atrial fibrillation: Secondary | ICD-10-CM

## 2014-09-05 DIAGNOSIS — I10 Essential (primary) hypertension: Secondary | ICD-10-CM

## 2014-09-05 MED ORDER — DILTIAZEM HCL ER COATED BEADS 180 MG PO CP24: 180.0000 mg | ORAL_CAPSULE | Freq: Every day | ORAL | Status: DC

## 2014-09-05 NOTE — Patient Instructions (Signed)
Schedule 24 hour Holter Monitor in 2 weeks   Schedule Echo in 2 months    Your physician recommends that you schedule a follow-up appointment in: 2 months after echo

## 2014-09-05 NOTE — Progress Notes (Signed)
HPI The patient presents for follow up of new onset atrial fib.  She was going to have cardioversion but actually went back into sinus rhythm on her own. She presents now for followup. She actually was unable to get her Cardizem renewed for some reason. She is back in atrial fibrillation and she does not feel this. She does not have any palpitations, presyncope or syncope. She has had no chest pressure, neck or arm discomfort. He has had no weight gain or edema. She denies any shortness of breath, PND or orthopnea.   No Known Allergies  Current Outpatient Prescriptions  Medication Sig Dispense Refill  . allopurinol (ZYLOPRIM) 100 MG tablet Take 100 mg by mouth every evening.    . latanoprost (XALATAN) 0.005 % ophthalmic solution Place 1 drop into both eyes at bedtime.    . rivaroxaban (XARELTO) 20 MG TABS tablet Take 1 tablet (20 mg total) by mouth daily with supper. 30 tablet 0  . XARELTO 20 MG TABS tablet take 1 tablet by mouth once daily WITH SUPPER 30 tablet 6   No current facility-administered medications for this visit.    Past Medical History  Diagnosis Date  . Hypertension   . Gout   . Arthritis     "left knee" (10/09/2012)  . Glaucoma (increased eye pressure)     "both eyes" (10/09/2012)  . Hx of echocardiogram     a. Echo (09/2012):  Mild LVH, EF 55-65%, Gr 1 DD, MAC, mild MR, mild to mod LAE, mild RVE, PASP 44 mmHg  . Atrial fibrillation   . Carotid artery occlusion     a. Carotid US (12/10/13):  R 60-79%; L 1-39% - f/u 6 mos  . Stroke 10/09/2012    Past Surgical History  Procedure Laterality Date  . Knee arthroscopy w/ debridement  2011    "left; for arthritis" (10/09/2012)  . Breast biopsy  1980's    "left; it wasn't anything" (10/09/2012)    ROS:  As stated in the HPI and negative for all other systems.   PHYSICAL EXAM BP 154/100 mmHg  Pulse 112  Ht 5\' 4"  (1.626 m)  Wt 244 lb 14.4 oz (111.086 kg)  BMI 42.02 kg/m2 GENERAL:  Well appearing NECK:  No  jugular venous distention, waveform within normal limits, carotid upstroke brisk and symmetric, no bruits, no thyromegaly LUNGS:  Clear to auscultation bilaterally BACK:  No CVA tenderness CHEST:  Unremarkable HEART:  PMI not displaced or sustained,S1 and S2 within normal limits, no S3, no clicks, no rubs, no murmurs, irregular ABD:  Flat, positive bowel sounds normal in frequency in pitch, no bruits, no rebound, no guarding, no midline pulsatile mass, no hepatomegaly, no splenomegaly EXT:  2 plus pulses throughout, no edema, no cyanosis no clubbing   EKG:  Atrial fibrillation, rate 112 ,axis leftward, intervals within normal limits, poor anterior R wave progression, baseline artifact, no acute ST-T wave changes  12/06/13  ASSESSMENT AND PLAN  ATRIAL FIB:  We had a long discussion about management strategies. At this point I don't think there is anything advantage to cardioversion or antiarrhythmic therapy. I don't think this would allow her to come off of anticoagulation.  Ms. ZEOLA BRYS has a CHA2DS2 - VASc score of 4 with a risk of stroke of 4.  She does not feel that fibrillation. Rather we will pursue rate control and anticoagulation. I will start her back on her Cardizem 180 mg daily. She will need to get a Holter monitor  in 2 weeks. Because she's not had an echo since 2013 I will wait a couple of months in order one of these to make sure she has normal left ventricular function and no other significant structural abnormalities.  HTN:  This will be treated in the context of treating her fibrillation.   We discussed keeping a blood pressure diary after she restarts her Cardizem  CAROTID DOPPLER:  She will need follow up Doppler and I will arrange this when she comes back for echo.

## 2014-09-05 NOTE — Addendum Note (Signed)
Addended by: Golden Hurter D on: 09/05/2014 02:31 PM   Modules accepted: Orders

## 2014-09-15 ENCOUNTER — Encounter (INDEPENDENT_AMBULATORY_CARE_PROVIDER_SITE_OTHER): Payer: 59

## 2014-09-15 ENCOUNTER — Encounter: Payer: Self-pay | Admitting: *Deleted

## 2014-09-15 DIAGNOSIS — I480 Paroxysmal atrial fibrillation: Secondary | ICD-10-CM

## 2014-09-15 DIAGNOSIS — I10 Essential (primary) hypertension: Secondary | ICD-10-CM

## 2014-09-15 NOTE — Progress Notes (Signed)
Patient ID: Erin Holt, female   DOB: 1951-10-23, 63 y.o.   MRN: [id number] [facility] 24 hour holter monitor applied to patient.

## 2014-10-05 ENCOUNTER — Other Ambulatory Visit: Payer: Self-pay | Admitting: Cardiology

## 2014-10-17 ENCOUNTER — Other Ambulatory Visit (HOSPITAL_COMMUNITY): Payer: Self-pay | Admitting: *Deleted

## 2014-10-17 DIAGNOSIS — I6523 Occlusion and stenosis of bilateral carotid arteries: Secondary | ICD-10-CM

## 2014-11-10 ENCOUNTER — Other Ambulatory Visit (HOSPITAL_COMMUNITY): Payer: Self-pay | Admitting: Cardiology

## 2014-11-10 DIAGNOSIS — I480 Paroxysmal atrial fibrillation: Secondary | ICD-10-CM

## 2014-11-12 ENCOUNTER — Ambulatory Visit (HOSPITAL_COMMUNITY): Payer: 59 | Attending: Cardiovascular Disease | Admitting: Radiology

## 2014-11-12 ENCOUNTER — Ambulatory Visit (HOSPITAL_BASED_OUTPATIENT_CLINIC_OR_DEPARTMENT_OTHER): Payer: 59 | Admitting: Cardiology

## 2014-11-12 DIAGNOSIS — I480 Paroxysmal atrial fibrillation: Secondary | ICD-10-CM | POA: Insufficient documentation

## 2014-11-12 DIAGNOSIS — I6523 Occlusion and stenosis of bilateral carotid arteries: Secondary | ICD-10-CM

## 2014-11-12 DIAGNOSIS — Z6841 Body Mass Index (BMI) 40.0 and over, adult: Secondary | ICD-10-CM | POA: Insufficient documentation

## 2014-11-12 DIAGNOSIS — I10 Essential (primary) hypertension: Secondary | ICD-10-CM | POA: Diagnosis not present

## 2014-11-12 MED ORDER — PERFLUTREN LIPID MICROSPHERE
3.0000 mL | Freq: Once | INTRAVENOUS | Status: AC
  Administered 2014-11-12: 3 mL via INTRAVENOUS

## 2014-11-12 NOTE — Progress Notes (Signed)
Carotid duplex performed 

## 2014-11-12 NOTE — Progress Notes (Signed)
Echocardiogram performed with [facility].  

## 2015-04-05 ENCOUNTER — Other Ambulatory Visit: Payer: Self-pay | Admitting: Cardiology

## 2015-05-05 ENCOUNTER — Other Ambulatory Visit: Payer: Self-pay | Admitting: Cardiology

## 2015-05-06 ENCOUNTER — Ambulatory Visit: Payer: 59 | Admitting: Cardiology

## 2015-06-01 ENCOUNTER — Encounter: Payer: Self-pay | Admitting: Physician Assistant

## 2015-06-01 ENCOUNTER — Ambulatory Visit (INDEPENDENT_AMBULATORY_CARE_PROVIDER_SITE_OTHER): Payer: Medicare Other | Admitting: Physician Assistant

## 2015-06-01 VITALS — BP 160/100 | HR 88 | Ht 64.5 in | Wt 254.4 lb

## 2015-06-01 DIAGNOSIS — E669 Obesity, unspecified: Secondary | ICD-10-CM

## 2015-06-01 DIAGNOSIS — I480 Paroxysmal atrial fibrillation: Secondary | ICD-10-CM

## 2015-06-01 DIAGNOSIS — G4719 Other hypersomnia: Secondary | ICD-10-CM | POA: Insufficient documentation

## 2015-06-01 DIAGNOSIS — I6523 Occlusion and stenosis of bilateral carotid arteries: Secondary | ICD-10-CM | POA: Diagnosis not present

## 2015-06-01 DIAGNOSIS — I10 Essential (primary) hypertension: Secondary | ICD-10-CM

## 2015-06-01 DIAGNOSIS — R5383 Other fatigue: Secondary | ICD-10-CM

## 2015-06-01 MED ORDER — HYDRALAZINE HCL 25 MG PO TABS: 25.0000 mg | ORAL_TABLET | Freq: Three times a day (TID) | ORAL | Status: DC

## 2015-06-01 MED ORDER — HYDRALAZINE HCL 25 MG PO TABS: 25.0000 mg | ORAL_TABLET | Freq: Two times a day (BID) | ORAL | Status: DC

## 2015-06-01 NOTE — Assessment & Plan Note (Signed)
Stable carotid disease by carotid Dopplers in January 2016

## 2015-06-01 NOTE — Assessment & Plan Note (Signed)
Discussed low-carb diet 

## 2015-06-01 NOTE — Assessment & Plan Note (Signed)
Patient reports that if she sits down during work day she gets very tired. She's also been told that she snores and will suddenly stop.  Her echocardiogram from January 2016 shows a progressively more dilated right ventricle. She'll be scheduled for a sleep study.

## 2015-06-01 NOTE — Patient Instructions (Signed)
Your physician has recommended that you have a sleep study. This test records several body functions during sleep, including: brain activity, eye movement, oxygen and carbon dioxide blood levels, heart rate and rhythm, breathing rate and rhythm, the flow of air through your mouth and nose, snoring, body muscle movements, and chest and belly movement. This will be scheduled at [facility]. They will contact you with the appointment.  Your physician has recommended you make the following change in your medication: start new prescription for hydralazine 25 mg. This has already been sent to your pharmacy.  Your physician recommends that you schedule a follow-up appointment in: 3 months with Dr. Percival Spanish.

## 2015-06-01 NOTE — Assessment & Plan Note (Signed)
Think she still needs better blood pressure control. Will add hydralazine 25 mg twice a day. She will continue to monitor her blood pressure at home we talked about target less than 130/80.Marland Kitchen

## 2015-06-01 NOTE — Assessment & Plan Note (Addendum)
She continued in atrial fibrillation rate is well controlled. Cardiac Cardizem was recently increased to 240 mg for better blood pressure control.  She is on Xarelto

## 2015-06-01 NOTE — Progress Notes (Signed)
Patient ID: Erin Holt, female   DOB: [birth year], 64 y.o.   MRN: [id number]    Date:  06/01/2015   ID:  Erin Holt, DOB December 13, 1950, MRN [id number]  PCP:  Lottie Dawson, MD  Primary Cardiologist:  [facility]   Chief Complaint  Patient presents with  . Annual Exam    patient reports having fatique     History of Present Illness: Erin Holt is a 64 y.o. female  with history of atrial fibrillation which began in November 2015.  At that time she was going to have cardioversion but actually went back into sinus rhythm on her own.  At the time of her follow-up last fall, she was back in atrial fibrillation because she was unable to get her Cardizem refilled.CHADSVASC 4.  She had a 2-D echocardiogram January 2016 which revealed an ejection fraction of 40-45%. There was mild LVH. The right ventricle was moderately dilated. Right atrium was moderately dilated.  Mild TR.  She also had carotid Dopplers which revealed stable 60-79% RICA stenosis.  Stable 6-65% LICA stenosis.  Normal subclavian arteries, bilaterally.  Patent vertebral arteries with antegrade flow.   Patient presents for her annual visit.  Patient reports that she feels more tired after work that she did before. 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 She essentially works 7 days a week. She says if she sits down during work she is very tired.  She does do a lot of mopping and at no time did she have chest pain. She's been told that she snores. Her blood pressure at work arranges 1:30 to 140/90 today was 140/100. She does have lower extremity edema on the left ankle which is the side that she had her stroke on.    She currently denies nausea, vomiting, fever, chest pain, shortness of breath, orthopnea, dizziness, PND, cough, congestion, abdominal pain, hematochezia, melena, edema, claudication.  Wt Readings from Last 3 Encounters:  06/01/15 254 lb 6.4 oz (115.395 kg)    09/05/14 244 lb 14.4 oz (111.086 kg)  03/04/14 249 lb (112.946 kg)     Past Medical History  Diagnosis Date  . Hypertension   . Gout   . Arthritis     "left knee" (10/09/2012)  . Glaucoma (increased eye pressure)     "both eyes" (10/09/2012)  . Hx of echocardiogram     a. Echo (09/2012):  Mild LVH, EF 55-65%, Gr 1 DD, MAC, mild MR, mild to mod LAE, mild RVE, PASP 44 mmHg  . Atrial fibrillation   . Carotid artery occlusion     a. Carotid US (12/10/13):  R 60-79%; L 1-39% - f/u 6 mos  . Stroke 10/09/2012    Current Outpatient Prescriptions  Medication Sig Dispense Refill  . allopurinol (ZYLOPRIM) 100 MG tablet Take 100 mg by mouth every evening.    Marland Kitchen atorvastatin (LIPITOR) 40 MG tablet Take 1 tablet by mouth daily.  0  . diltiazem (CARDIZEM CD) 240 MG 24 hr capsule Take 1 capsule by mouth daily.  0  . latanoprost (XALATAN) 0.005 % ophthalmic solution Place 1 drop into both eyes at bedtime.    . rivaroxaban (XARELTO) 20 MG TABS tablet Take 1 tablet (20 mg total) by mouth daily with supper. 30 tablet 0  . hydrALAZINE (APRESOLINE) 25 MG tablet Take 1 tablet (25 mg total) by mouth 2 (two) times daily. 60 tablet 6   No current facility-administered medications for this visit.  Allergies:   No Known Allergies  Social History:  The patient  reports that she has never smoked. She has never used smokeless tobacco. She reports that she does not drink alcohol or use illicit drugs.   Family history:   Family History  Problem Relation Age of Onset  . Heart failure Mother 24    Died  . Diabetes Mother   . Diabetes Father   . CAD Father 36    ROS:  Please see the history of present illness.  All other systems reviewed and negative.   PHYSICAL EXAM: VS:  BP 160/100 mmHg  Pulse 88  Ht 5' 4.5" (1.638 m)  Wt 254 lb 6.4 oz (115.395 kg)  BMI 43.01 kg/m2 Obese, well developed, in no acute distress HEENT: Pupils are equal round react to light accommodation extraocular movements are  intact.  Neck: no JVDNo cervical lymphadenopathy. Cardiac: Irregular rate and rhythm without any murmurs.  Lungs:  clear to auscultation bilaterally, no wheezing, rhonchi or rales Abd: soft, nontender, positive bowel sounds all quadrants,  Ext: Trace lower extremity edema.  2+ radial and 1+ dorsalis pedis pulses. Skin: warm and dry Neuro:  Grossly normal  EKG:  Atrial fibrillation with a rate of 80 bpm    ASSESSMENT AND PLAN:  Problem List Items Addressed This Visit    Paroxysmal atrial fibrillation    She continued in atrial fibrillation rate is well controlled. Cardiac Cardizem was recently increased to 240 mg for better blood pressure control.  She is on Xarelto      Relevant Medications   diltiazem (CARDIZEM CD) 240 MG 24 hr capsule   atorvastatin (LIPITOR) 40 MG tablet   hydrALAZINE (APRESOLINE) 25 MG tablet   Other Relevant Orders   EKG 12-Lead   Obesity (Chronic)    Discussed low carb diet      Hypertension, essential - Primary    Think she still needs better blood pressure control. Will add hydralazine 25 mg twice a day. She will continue to monitor her blood pressure at home we talked about target less than 130/80.Marland Kitchen      Relevant Medications   diltiazem (CARDIZEM CD) 240 MG 24 hr capsule   atorvastatin (LIPITOR) 40 MG tablet   hydrALAZINE (APRESOLINE) 25 MG tablet   Other Relevant Orders   EKG 12-Lead   Excessive daytime sleepiness    Patient reports that if she sits down during work day she gets very tired. She's also been told that she snores and will suddenly stop.  Her echocardiogram from January 2016 shows a progressively more dilated right ventricle. She'll be scheduled for a sleep study.      Relevant Orders   Split night study   Carotid stenosis    Stable carotid disease by carotid Dopplers in January 2016      Relevant Medications   diltiazem (CARDIZEM CD) 240 MG 24 hr capsule   atorvastatin (LIPITOR) 40 MG tablet   hydrALAZINE (APRESOLINE) 25 MG  tablet    Other Visit Diagnoses    Other fatigue        Relevant Orders    Split night study

## 2015-06-03 ENCOUNTER — Other Ambulatory Visit: Payer: Self-pay | Admitting: Cardiology

## 2015-06-04 ENCOUNTER — Other Ambulatory Visit: Payer: Self-pay

## 2015-06-04 MED ORDER — RIVAROXABAN 20 MG PO TABS: 20.0000 mg | ORAL_TABLET | Freq: Every day | ORAL | Status: DC

## 2015-07-28 ENCOUNTER — Telehealth: Payer: Self-pay | Admitting: Cardiology

## 2015-08-05 NOTE — Telephone Encounter (Signed)
Closed encounter °

## 2015-08-19 ENCOUNTER — Ambulatory Visit (HOSPITAL_BASED_OUTPATIENT_CLINIC_OR_DEPARTMENT_OTHER): Payer: Medicare Other | Attending: Physician Assistant | Admitting: *Deleted

## 2015-08-19 VITALS — Ht 64.5 in | Wt 255.0 lb

## 2015-08-19 DIAGNOSIS — G4733 Obstructive sleep apnea (adult) (pediatric): Secondary | ICD-10-CM | POA: Diagnosis not present

## 2015-08-19 DIAGNOSIS — I493 Ventricular premature depolarization: Secondary | ICD-10-CM | POA: Insufficient documentation

## 2015-08-19 DIAGNOSIS — R0683 Snoring: Secondary | ICD-10-CM | POA: Insufficient documentation

## 2015-08-19 DIAGNOSIS — I10 Essential (primary) hypertension: Secondary | ICD-10-CM | POA: Diagnosis not present

## 2015-08-19 DIAGNOSIS — G4700 Insomnia, unspecified: Secondary | ICD-10-CM | POA: Diagnosis not present

## 2015-08-19 DIAGNOSIS — G4731 Primary central sleep apnea: Secondary | ICD-10-CM | POA: Insufficient documentation

## 2015-08-19 DIAGNOSIS — I4891 Unspecified atrial fibrillation: Secondary | ICD-10-CM | POA: Insufficient documentation

## 2015-08-21 DIAGNOSIS — H401131 Primary open-angle glaucoma, bilateral, mild stage: Secondary | ICD-10-CM | POA: Diagnosis not present

## 2015-08-29 NOTE — Sleep Study (Addendum)
Patient Name: Erin Holt, Erin Holt Date: 08/19/2015 Gender: Female D.O.B: [birth year] Age (years): 22 Referring Provider: Tarri Fuller Height (inches): 65 Interpreting Physician: Shelva Majestic MD, ABSM Weight (lbs): 255 RPSGT: Gerhard Perches BMI: 42 MRN: [id number] Neck Size: 15.25 CLINICAL INFORMATION Sleep Study Type: Split Night CPAP   Indication for sleep study: Atrial Fibrillation, hypertension, nonrestorative sleep, snoring.   Epworth Sleepiness Score: 5  SLEEP STUDY TECHNIQUE As per the AASM Manual for the Scoring of Sleep and Associated Events v2.3 (April 2016) with a hypopnea requiring 4% desaturations. The channels recorded and monitored were frontal, central and occipital EEG, electrooculogram (EOG), submentalis EMG (chin), nasal and oral airflow, thoracic and abdominal wall motion, anterior tibialis EMG, snore microphone, electrocardiogram, and pulse oximetry. Continuous positive airway pressure (CPAP) was initiated when the patient met split night criteria and was titrated according to treat sleep-disordered breathing.  MEDICATIONS Medications   allopurinol (ZYLOPRIM) 100 MG tablet 100 mg, Every evening     atorvastatin (LIPITOR) 40 MG tablet 1 tablet, Daily     Note: Received from: External Pharmacy Received Sig: (Written 06/01/2015 1502)   diltiazem (CARDIZEM CD) 240 MG 24 hr capsule 1 capsule, Daily     Note: Received from: External Pharmacy Received Sig: (Written 06/01/2015 1502)   hydrALAZINE (APRESOLINE) 25 MG tablet 25 mg, 2 times daily     latanoprost (XALATAN) 0.005 % ophthalmic solution 1 drop, Daily at bedtime     rivaroxaban (XARELTO) 20 MG TABS tablet 20 mg, Daily with supper     rivaroxaban (XARELTO) 20 MG TABS tablet 20 mg, Daily with supper     Procedures Ordered This Visit   Medications administered by patient during sleep study : No sleep medicine administered.  RESPIRATORY PARAMETERS Diagnostic Total AHI (/hr): 38.3 RDI (/hr): 38.3 OA Index  (/hr): 7.7 CA Index (/hr): 25.8 REM AHI (/hr): 72.0 NREM AHI (/hr): 33.7 Supine AHI (/hr): N/A Non-supine AHI (/hr): 38.28 Min O2 Sat (%): 78.00 Mean O2 (%): 96.45 Time below 88% (min): 0.8   Titration Optimal Pressure (cm): 10 AHI at Optimal Pressure (/hr): 0.0 Min O2 at Optimal Pressure (%): 95.0 Supine % at Optimal (%): 0 Sleep % at Optimal (%): 95    SLEEP ARCHITECTURE The recording time for the entire night was 395.4 minutes. During a baseline period of 152.4 minutes, the patient slept for 125.4 minutes in REM and nonREM, yielding a sleep efficiency of 82.3%. Sleep onset after lights out was 15.5 minutes with a REM latency of 75.5 minutes. The patient spent 6.78% of the night in stage N1 sleep, 60.93% in stage N2 sleep, 20.33% in stage N3 and 11.96% in REM.  During the titration period of 237.5 minutes, the patient slept for 148.4 minutes in REM and nonREM, yielding a sleep efficiency of 62.5%. Sleep onset after CPAP initiation was 7.1 minutes with a REM latency of 87.0 minutes. The patient spent 12.13% of the night in stage N1 sleep, 27.56% in stage N2 sleep, 23.92% in stage N3 and 36.39% in REM.  CARDIAC DATA The 2 lead EKG demonstrated atrial fibrillation. The mean heart rate was 79.79 beats per minute. Other EKG findings include: PVCs.  LEG MOVEMENT DATA The total Periodic Limb Movements of Sleep (PLMS) were 0. The PLMS index was 0.00 .  IMPRESSIONS - Complex sleep apnea with both severe obstructive sleep apnea (AHI = 38.3/hour) and moderate central sleep apnea  (CAI = 25.8/hour) during the diagnostic portion of the study. Events were more severe during REM sleep (AHI =  72/hour).  [place]ynes Stokes respiration pattern was noted which may reflect prolonged circulatory time.  - Severe oxygen desaturation during the diagnostic portion of the study to a nadir of 78.00%. - The patient snored with Loud snoring volume during the diagnostic portion of the study. - EKG findings include atrial  fibrillation with occasional PVCs. - Clinically significant periodic limb movements did not occur during sleep.  DIAGNOSIS - Obstructive Sleep Apnea (327.23 [G47.33 ICD-10]) - Central Sleep Apnea  327.21  RECOMMENDATIONS - Recommend an initial trial of CPAP with EPR of 3 at10 cm H2O pressure with heated humidification . During the titration a large size Fisher&Paykel Nasal Mask Eson mask was utilized. If central events continue with CPAP, BiPAP may be necessary. - Efforts should be made to optimize nasal and oropharyngeal patency. - Avoid alcohol, sedatives and other CNS depressants that may worsen sleep apnea and disrupt normal sleep architecture. - Sleep hygiene should be reviewed to assess factors that may improve sleep quality. - Weight management and regular exercise should be initiated or continued. - Recommend download in 30 days and sleep clinic evaluation.   Troy Sine, MD, [place], American Board of Sleep Medicine  ELECTRONICALLY SIGNED ON:  08/29/2015, 6:08 PM [place] PH: [phone]   FX: [phone] [place]

## 2015-09-01 ENCOUNTER — Ambulatory Visit (INDEPENDENT_AMBULATORY_CARE_PROVIDER_SITE_OTHER): Payer: Medicare Other | Admitting: Cardiology

## 2015-09-01 ENCOUNTER — Ambulatory Visit (INDEPENDENT_AMBULATORY_CARE_PROVIDER_SITE_OTHER): Payer: Medicare Other

## 2015-09-01 ENCOUNTER — Telehealth: Payer: Self-pay | Admitting: *Deleted

## 2015-09-01 ENCOUNTER — Encounter: Payer: Self-pay | Admitting: Cardiology

## 2015-09-01 VITALS — BP 162/92 | HR 84 | Ht 65.0 in | Wt 254.1 lb

## 2015-09-01 DIAGNOSIS — I519 Heart disease, unspecified: Secondary | ICD-10-CM | POA: Diagnosis not present

## 2015-09-01 DIAGNOSIS — I480 Paroxysmal atrial fibrillation: Secondary | ICD-10-CM

## 2015-09-01 DIAGNOSIS — Z79899 Other long term (current) drug therapy: Secondary | ICD-10-CM | POA: Diagnosis not present

## 2015-09-01 DIAGNOSIS — I4891 Unspecified atrial fibrillation: Secondary | ICD-10-CM | POA: Diagnosis not present

## 2015-09-01 DIAGNOSIS — R5383 Other fatigue: Secondary | ICD-10-CM

## 2015-09-01 DIAGNOSIS — I6523 Occlusion and stenosis of bilateral carotid arteries: Secondary | ICD-10-CM | POA: Diagnosis not present

## 2015-09-01 MED ORDER — LOSARTAN POTASSIUM 50 MG PO TABS: 50.0000 mg | ORAL_TABLET | Freq: Every day | ORAL | Status: DC

## 2015-09-01 MED ORDER — RIVAROXABAN 20 MG PO TABS: 20.0000 mg | ORAL_TABLET | Freq: Every day | ORAL | Status: DC

## 2015-09-01 NOTE — Progress Notes (Signed)
HPI The patient presents for follow up of new onset atrial fib.  She was going to have cardioversion but actually went back into sinus rhythm on her own. She presents now for followup.  At the last appointment her blood pressure was elevated and the hydralazine was added.  She actually says she's feeling well. She was saying that she was tired but she doesn't recall this. She was found to have sleep apnea. She has yet to start CPAP. She doesn't really report any tiredness now. She doesn't have any headaches. She does note some palpitations, presyncope or syncope. She's not having any PND or orthopnea. She has no weight gain or edema. She works 7 days a week.  No Known Allergies  Current Outpatient Prescriptions  Medication Sig Dispense Refill  . allopurinol (ZYLOPRIM) 100 MG tablet Take 100 mg by mouth every evening.    Marland Kitchen atorvastatin (LIPITOR) 40 MG tablet Take 1 tablet by mouth daily.  0  . diltiazem (CARDIZEM CD) 240 MG 24 hr capsule Take 1 capsule by mouth daily.  0  . hydrALAZINE (APRESOLINE) 25 MG tablet Take 1 tablet (25 mg total) by mouth 2 (two) times daily. 60 tablet 6  . latanoprost (XALATAN) 0.005 % ophthalmic solution Place 1 drop into both eyes at bedtime.    . rivaroxaban (XARELTO) 20 MG TABS tablet Take 1 tablet (20 mg total) by mouth daily with supper. 30 tablet 1   No current facility-administered medications for this visit.    Past Medical History  Diagnosis Date  . Hypertension   . Gout   . Arthritis     "left knee" (10/09/2012)  . Glaucoma (increased eye pressure)     "both eyes" (10/09/2012)  . Hx of echocardiogram     a. Echo (09/2012):  Mild LVH, EF 55-65%, Gr 1 DD, MAC, mild MR, mild to mod LAE, mild RVE, PASP 44 mmHg  . Atrial fibrillation ([place])   . Carotid artery occlusion     a. Carotid US (12/10/13):  R 60-79%; L 1-39% - f/u 6 mos  . Stroke [facility]) 10/09/2012    Past Surgical History  Procedure Laterality Date  . Knee arthroscopy w/ debridement  2011      "left; for arthritis" (10/09/2012)  . Breast biopsy  1980's    "left; it wasn't anything" (10/09/2012)    ROS:  As stated in the HPI and negative for all other systems.   PHYSICAL EXAM BP 162/92 mmHg  Pulse 84  Ht 5\' 5"  (1.651 m)  Wt 254 lb 1.6 oz (115.259 kg)  BMI 42.28 kg/m2 GENERAL:  Well appearing NECK:  No jugular venous distention, waveform within normal limits, carotid upstroke brisk and symmetric, no bruits, no thyromegaly LUNGS:  Clear to auscultation bilaterally BACK:  No CVA tenderness CHEST:  Unremarkable HEART:  PMI not displaced or sustained,S1 and S2 within normal limits, no S3, no clicks, no rubs, no murmurs, irregular ABD:  Flat, positive bowel sounds normal in frequency in pitch, no bruits, no rebound, no guarding, no midline pulsatile mass, no hepatomegaly, no splenomegaly EXT:  2 plus pulses throughout, no edema, no cyanosis no clubbing   ASSESSMENT AND PLAN  ATRIAL FIB:  At this point I don't think there is anything advantage to cardioversion or antiarrhythmic therapy. I don't think this would allow her to come off of anticoagulation.  [name] has a CHA2DS2 - VASc score of 4 with a risk of stroke of 4.  She does not  feel that fibrillation. I will pursue rate control and anticoagulation.   She will have a Holter placed to judge rate control.  HTN:  He has slightly reduced ejection fraction and stop the hydralazine increased Cozaar for rate control. I'll likely change to beta blockers in the future.  I will be checking a basic metabolic profile in about one week. Also check a TSH.  CAROTID DOPPLER:  She will need follow up Doppler which I will have done in January comes back for follow-up echo.  CARDIOMYOPATHY:  She's had a mildly reduced ejection fraction. Repeat an echocardiogram in January change meds as above.  SLEEP APNEA:  I will refer her to see Dr. Claiborne Billings.

## 2015-09-01 NOTE — Telephone Encounter (Signed)
CPAP referral done to [facility].

## 2015-09-01 NOTE — Progress Notes (Signed)
Patient was in the office seeing Dr. Percival Spanish. Notified of sleep study and recommendations.

## 2015-09-01 NOTE — Patient Instructions (Signed)
Your physician recommends that you schedule a follow-up appointment in: 3 Months.  Your physician has recommended you make the following change in your medication: STOP Hydralazine and START Losartan 50 mg daily  Your physician recommends that you return for lab work in: 1 Week BMP and TSH  Your physician has requested that you have an echocardiogram. Echocardiography is a painless test that uses sound waves to create images of your heart. It provides your doctor with information about the size and shape of your heart and how well your heart's chambers and valves are working. This procedure takes approximately one hour. There are no restrictions for this procedure.  Your physician has requested that you have a carotid duplex. This test is an ultrasound of the carotid arteries in your neck. It looks at blood flow through these arteries that supply the brain with blood. Allow one hour for this exam. There are no restrictions or special instructions.  Your physician has recommended that you wear a holter monitor. Holter monitors are medical devices that record the heart's electrical activity. Doctors most often use these monitors to diagnose arrhythmias. Arrhythmias are problems with the speed or rhythm of the heartbeat. The monitor is a small, portable device. You can wear one while you do your normal daily activities. This is usually used to diagnose what is causing palpitations/syncope (passing out).

## 2015-09-03 ENCOUNTER — Ambulatory Visit: Payer: 59 | Admitting: Cardiology

## 2015-09-11 DIAGNOSIS — Z79899 Other long term (current) drug therapy: Secondary | ICD-10-CM | POA: Diagnosis not present

## 2015-09-11 DIAGNOSIS — R5383 Other fatigue: Secondary | ICD-10-CM | POA: Diagnosis not present

## 2015-09-12 LAB — BASIC METABOLIC PANEL
BUN: 24 mg/dL (ref 7–25)
CALCIUM: 9.1 mg/dL (ref 8.6–10.4)
CO2: 26 mmol/L (ref 20–31)
Chloride: 107 mmol/L (ref 98–110)
Creat: 0.93 mg/dL (ref 0.50–0.99)
Glucose, Bld: 119 mg/dL — ABNORMAL HIGH (ref 65–99)
POTASSIUM: 4.1 mmol/L (ref 3.5–5.3)
Sodium: 141 mmol/L (ref 135–146)

## 2015-09-12 LAB — TSH: TSH: 2.016 u[IU]/mL (ref 0.350–4.500)

## 2015-09-16 ENCOUNTER — Telehealth: Payer: Self-pay | Admitting: *Deleted

## 2015-09-16 NOTE — Telephone Encounter (Signed)
Received a notice from [facility] me that they do not take patient's insurance and she does not have out of network benefits. CPAP Referral resent to [facility].

## 2015-12-01 ENCOUNTER — Ambulatory Visit (HOSPITAL_COMMUNITY): Payer: Medicare Other | Attending: Cardiology

## 2015-12-01 ENCOUNTER — Other Ambulatory Visit: Payer: Self-pay

## 2015-12-01 DIAGNOSIS — I272 Other secondary pulmonary hypertension: Secondary | ICD-10-CM | POA: Insufficient documentation

## 2015-12-01 DIAGNOSIS — I519 Heart disease, unspecified: Secondary | ICD-10-CM

## 2015-12-01 DIAGNOSIS — I77810 Thoracic aortic ectasia: Secondary | ICD-10-CM | POA: Insufficient documentation

## 2015-12-01 DIAGNOSIS — I340 Nonrheumatic mitral (valve) insufficiency: Secondary | ICD-10-CM | POA: Insufficient documentation

## 2015-12-01 DIAGNOSIS — I480 Paroxysmal atrial fibrillation: Secondary | ICD-10-CM | POA: Insufficient documentation

## 2015-12-01 DIAGNOSIS — Z6841 Body Mass Index (BMI) 40.0 and over, adult: Secondary | ICD-10-CM | POA: Diagnosis not present

## 2015-12-01 DIAGNOSIS — I10 Essential (primary) hypertension: Secondary | ICD-10-CM | POA: Insufficient documentation

## 2015-12-01 DIAGNOSIS — I071 Rheumatic tricuspid insufficiency: Secondary | ICD-10-CM | POA: Diagnosis not present

## 2015-12-01 DIAGNOSIS — I517 Cardiomegaly: Secondary | ICD-10-CM | POA: Insufficient documentation

## 2015-12-01 DIAGNOSIS — I4891 Unspecified atrial fibrillation: Secondary | ICD-10-CM | POA: Insufficient documentation

## 2015-12-08 ENCOUNTER — Ambulatory Visit (INDEPENDENT_AMBULATORY_CARE_PROVIDER_SITE_OTHER): Payer: Medicare Other | Admitting: Cardiology

## 2015-12-08 ENCOUNTER — Encounter: Payer: Self-pay | Admitting: Cardiology

## 2015-12-08 VITALS — BP 154/100 | HR 72 | Ht 64.0 in | Wt 251.0 lb

## 2015-12-08 DIAGNOSIS — G4733 Obstructive sleep apnea (adult) (pediatric): Secondary | ICD-10-CM | POA: Diagnosis not present

## 2015-12-08 DIAGNOSIS — I6523 Occlusion and stenosis of bilateral carotid arteries: Secondary | ICD-10-CM | POA: Diagnosis not present

## 2015-12-08 NOTE — Patient Instructions (Signed)
Your physician has recommended that you have a sleep study. This test records several body functions during sleep, including: brain activity, eye movement, oxygen and carbon dioxide blood levels, heart rate and rhythm, breathing rate and rhythm, the flow of air through your mouth and nose, snoring, body muscle movements, and chest and belly movement.  Your physician has requested that you have a carotid duplex. This test is an ultrasound of the carotid arteries in your neck. It looks at blood flow through these arteries that supply the brain with blood. Allow one hour for this exam. There are no restrictions or special instructions.   Dr Percival Spanish wants you to monitor and chart your blood bressure for 2 weeks. Then come for nurse visit for blood pressure check and monitor comparison.  Your physician recommends that you schedule a follow-up appointment in: 1 month with dr Percival Spanish.

## 2015-12-08 NOTE — Progress Notes (Signed)
HPI The patient presents for follow up of  atrial fib.  She was going to have cardioversion but actually went back into sinus rhythm on her own.   Later on follow up she was in atrial fib but without symptoms.  Holter demonstrated atrial fib with controlled rate.  We opted for rate control and anticoagulation.  Since I last saw her she has done well. She did have a sleep apnea study that was positive but has yet to get the CPAP and we are working with her on this. She denies any new cardiovascular symptoms.   She does note have presyncope or syncope. She's not having any PND or orthopnea. She has no weight gain or edema. She works 7 days a week.  No Known Allergies  Current Outpatient Prescriptions  Medication Sig Dispense Refill  . allopurinol (ZYLOPRIM) 100 MG tablet Take 100 mg by mouth every evening.    Marland Kitchen atorvastatin (LIPITOR) 40 MG tablet Take 1 tablet by mouth daily.  0  . diltiazem (CARDIZEM CD) 240 MG 24 hr capsule Take 1 capsule by mouth daily.  0  . latanoprost (XALATAN) 0.005 % ophthalmic solution Place 1 drop into both eyes at bedtime.    Marland Kitchen losartan (COZAAR) 50 MG tablet Take 1 tablet (50 mg total) by mouth daily. 90 tablet 3  . rivaroxaban (XARELTO) 20 MG TABS tablet Take 1 tablet (20 mg total) by mouth daily with supper. 90 tablet 3   No current facility-administered medications for this visit.    Past Medical History  Diagnosis Date  . Hypertension   . Gout   . Arthritis     "left knee" (10/09/2012)  . Glaucoma (increased eye pressure)     "both eyes" (10/09/2012)  . Hx of echocardiogram     a. Echo (09/2012):  Mild LVH, EF 55-65%, Gr 1 DD, MAC, mild MR, mild to mod LAE, mild RVE, PASP 44 mmHg  . Atrial fibrillation ([place])   . Carotid artery occlusion     a. Carotid US (12/10/13):  R 60-79%; L 1-39% - f/u 6 mos  . Stroke [facility]) 10/09/2012    Past Surgical History  Procedure Laterality Date  . Knee arthroscopy w/ debridement  2011    "left; for arthritis"  (10/09/2012)  . Breast biopsy  1980's    "left; it wasn't anything" (10/09/2012)    ROS:  As stated in the HPI and negative for all other systems.   PHYSICAL EXAM BP 154/100 mmHg  Pulse 72  Ht 5\' 4"  (1.626 m)  Wt 251 lb (113.853 kg)  BMI 43.06 kg/m2 GENERAL:  Well appearing NECK:  No jugular venous distention, waveform within normal limits, carotid upstroke brisk and symmetric, no bruits, no thyromegaly LUNGS:  Clear to auscultation bilaterally BACK:  No CVA tenderness CHEST:  Unremarkable HEART:  PMI not displaced or sustained,S1 and S2 within normal limits, no S3, no clicks, no rubs, no murmurs, irregular ABD:  Flat, positive bowel sounds normal in frequency in pitch, no bruits, no rebound, no guarding, no midline pulsatile mass, no hepatomegaly, no splenomegaly EXT:  2 plus pulses throughout, no edema, no cyanosis no clubbing   ASSESSMENT AND PLAN  ATRIAL FIB:  At this point I don't think there is anything advantage to cardioversion or antiarrhythmic therapy. I don't think this would allow her to come off of anticoagulation.  Erin Holt has a CHA2DS2 - VASc score of 4 with a risk of stroke of 4.  She does  not feel that fibrillation. I will pursue rate control and anticoagulation.   HTN:  Her blood pressure was elevated. She's going to get blood pressure cuff. She can come back for a nurse visit to make sure it works accurately. She's going to keep a diary and I will make adjustments based on this.  CAROTID DOPPLER:  She will need follow up Doppler and I will arrange this.  CARDIOMYOPATHY:   I did repeat her echocardiogram her EF was well-preserved that she has some moderate TR. I will follow this in the future  SLEEP APNEA:  The patient has a positive sleep study.  Unfortunately [employer] and [employer] did not cover her CPAP.  For now to much time has passed in order to get coverage the patient will need another sleep study for known sleep apnea prior to [place] being  able to get her the needed treatment. This is unfortunate.

## 2015-12-18 ENCOUNTER — Ambulatory Visit (HOSPITAL_COMMUNITY)
Admission: RE | Admit: 2015-12-18 | Discharge: 2015-12-18 | Disposition: A | Discharge status: Home / Self Care | Payer: Medicare Other | Source: Ambulatory Visit | Attending: Cardiology | Admitting: Cardiology

## 2015-12-18 ENCOUNTER — Inpatient Hospital Stay (HOSPITAL_COMMUNITY): Admission: RE | Admit: 2015-12-18 | Payer: Medicare Other | Source: Ambulatory Visit

## 2015-12-18 DIAGNOSIS — I6523 Occlusion and stenosis of bilateral carotid arteries: Secondary | ICD-10-CM | POA: Diagnosis not present

## 2015-12-18 DIAGNOSIS — I10 Essential (primary) hypertension: Secondary | ICD-10-CM | POA: Diagnosis not present

## 2016-01-12 DIAGNOSIS — Z79899 Other long term (current) drug therapy: Secondary | ICD-10-CM | POA: Diagnosis not present

## 2016-01-12 DIAGNOSIS — M1A09X0 Idiopathic chronic gout, multiple sites, without tophus (tophi): Secondary | ICD-10-CM | POA: Diagnosis not present

## 2016-01-13 ENCOUNTER — Encounter: Payer: Self-pay | Admitting: Cardiology

## 2016-01-13 ENCOUNTER — Ambulatory Visit (INDEPENDENT_AMBULATORY_CARE_PROVIDER_SITE_OTHER): Payer: Medicare Other | Admitting: Cardiology

## 2016-01-13 VITALS — BP 138/98 | HR 86 | Ht 64.5 in | Wt 244.2 lb

## 2016-01-13 DIAGNOSIS — I6523 Occlusion and stenosis of bilateral carotid arteries: Secondary | ICD-10-CM | POA: Diagnosis not present

## 2016-01-13 DIAGNOSIS — I480 Paroxysmal atrial fibrillation: Secondary | ICD-10-CM | POA: Diagnosis not present

## 2016-01-13 NOTE — Patient Instructions (Signed)
NO CHANGE CURRENT MEDICATIONS   Your physician wants you to follow-up in [place].  You will receive a reminder letter in the mail two months in advance. If you don't receive a letter, please call our office to schedule the follow-up appointment.  If you need a refill on your cardiac medications before your next appointment, please call your pharmacy.

## 2016-01-13 NOTE — Progress Notes (Signed)
HPI The patient presents for follow up of  atrial fib.  She was going to have cardioversion but actually went back into sinus rhythm on her own.   Later on follow up she was in atrial fib but without symptoms.  Holter demonstrated atrial fib with controlled rate.  We opted for rate control and anticoagulation.  Since I last saw her she has done well. She did have a sleep apnea study that was positive but has yet to get the CPAP and we are working with her on this. She has an appt scheduled.  She denies any new cardiovascular symptoms.   She does note have presyncope or syncope. She's not having any PND or orthopnea. She has no weight gain or edema.   She has actually lost some weight.    No Known Allergies  Current Outpatient Prescriptions  Medication Sig Dispense Refill  . allopurinol (ZYLOPRIM) 100 MG tablet Take 100 mg by mouth every evening.    Marland Kitchen atorvastatin (LIPITOR) 40 MG tablet Take 1 tablet by mouth daily.  0  . diltiazem (CARDIZEM CD) 240 MG 24 hr capsule Take 1 capsule by mouth daily.  0  . latanoprost (XALATAN) 0.005 % ophthalmic solution Place 1 drop into both eyes at bedtime.    Marland Kitchen losartan (COZAAR) 50 MG tablet Take 1 tablet (50 mg total) by mouth daily. 90 tablet 3  . rivaroxaban (XARELTO) 20 MG TABS tablet Take 1 tablet (20 mg total) by mouth daily with supper. 90 tablet 3   No current facility-administered medications for this visit.    Past Medical History  Diagnosis Date  . Hypertension   . Gout   . Arthritis     "left knee" (10/09/2012)  . Glaucoma (increased eye pressure)     "both eyes" (10/09/2012)  . Hx of echocardiogram     a. Echo (09/2012):  Mild LVH, EF 55-65%, Gr 1 DD, MAC, mild MR, mild to mod LAE, mild RVE, PASP 44 mmHg  . Atrial fibrillation ([place])   . Carotid artery occlusion     a. Carotid US (12/10/13):  R 60-79%; L 1-39% - f/u 6 mos  . Stroke [facility]) 10/09/2012    Past Surgical History  Procedure Laterality Date  . Knee arthroscopy w/ debridement   2011    "left; for arthritis" (10/09/2012)  . Breast biopsy  1980's    "left; it wasn't anything" (10/09/2012)    ROS:  As stated in the HPI and negative for all other systems.   PHYSICAL EXAM BP 138/98 mmHg  Pulse 86  Ht 5' 4.5" (1.638 m)  Wt 244 lb 3 oz (110.763 kg)  BMI 41.28 kg/m2 GENERAL:  Well appearing NECK:  No jugular venous distention, waveform within normal limits, carotid upstroke brisk and symmetric, no bruits, no thyromegaly LUNGS:  Clear to auscultation bilaterally BACK:  No CVA tenderness CHEST:  Unremarkable HEART:  PMI not displaced or sustained,S1 and S2 within normal limits, no S3, no clicks, no rubs, no murmurs, irregular ABD:  Flat, positive bowel sounds normal in frequency in pitch, no bruits, no rebound, no guarding, no midline pulsatile mass, no hepatomegaly, no splenomegaly EXT:  2 plus pulses throughout, no edema, no cyanosis no clubbing  EKG:  Atrial fib, rate 96, axis WNL, intervals WNL, PVC, no acute ST T wave changes.  01/13/2016   ASSESSMENT AND PLAN  ATRIAL FIB:  At this point I don't think there is anything advantage to cardioversion or antiarrhythmic therapy. We have discussed  this  Erin Holt has a CHA2DS2 - VASc score of 4 with a risk of stroke of 4.  She does not feel that fibrillation. I will pursue rate control and anticoagulation.   HTN:  Her blood pressure seems to be well controlled.    CAROTID STENOSIS:  She had 40 - 59% stenosis on the right and this will be repeated in one year.   CARDIOMYOPATHY:   I did repeat her echocardiogram her EF was well-preserved that she has some moderate TR. I will follow this in the future  SLEEP APNEA:  The patient has a positive sleep study.  Unfortunately [employer] and [employer] did not cover her CPAP.  For now to much time has passed in order to get coverage the patient will need another sleep study for known sleep apnea prior to [place] being able to get her the needed treatment. This is  unfortunate.

## 2016-01-29 ENCOUNTER — Ambulatory Visit (HOSPITAL_BASED_OUTPATIENT_CLINIC_OR_DEPARTMENT_OTHER): Payer: Medicare Other

## 2016-02-12 ENCOUNTER — Encounter (HOSPITAL_BASED_OUTPATIENT_CLINIC_OR_DEPARTMENT_OTHER): Payer: Medicare Other

## 2016-02-24 DIAGNOSIS — Z1231 Encounter for screening mammogram for malignant neoplasm of breast: Secondary | ICD-10-CM | POA: Diagnosis not present

## 2016-02-24 DIAGNOSIS — Z803 Family history of malignant neoplasm of breast: Secondary | ICD-10-CM | POA: Diagnosis not present

## 2016-05-04 DIAGNOSIS — H524 Presbyopia: Secondary | ICD-10-CM | POA: Diagnosis not present

## 2016-05-04 DIAGNOSIS — H2513 Age-related nuclear cataract, bilateral: Secondary | ICD-10-CM | POA: Diagnosis not present

## 2016-05-04 DIAGNOSIS — H35032 Hypertensive retinopathy, left eye: Secondary | ICD-10-CM | POA: Diagnosis not present

## 2016-05-04 DIAGNOSIS — H401131 Primary open-angle glaucoma, bilateral, mild stage: Secondary | ICD-10-CM | POA: Diagnosis not present

## 2016-05-04 DIAGNOSIS — H35031 Hypertensive retinopathy, right eye: Secondary | ICD-10-CM | POA: Diagnosis not present

## 2016-05-04 DIAGNOSIS — H25013 Cortical age-related cataract, bilateral: Secondary | ICD-10-CM | POA: Diagnosis not present

## 2016-05-04 DIAGNOSIS — I708 Atherosclerosis of other arteries: Secondary | ICD-10-CM | POA: Diagnosis not present

## 2016-07-14 ENCOUNTER — Ambulatory Visit (HOSPITAL_BASED_OUTPATIENT_CLINIC_OR_DEPARTMENT_OTHER): Payer: Medicare Other | Attending: Cardiology | Admitting: Cardiovascular Disease

## 2016-07-14 VITALS — Ht 64.0 in | Wt 240.0 lb

## 2016-07-14 DIAGNOSIS — G4733 Obstructive sleep apnea (adult) (pediatric): Secondary | ICD-10-CM

## 2016-07-16 NOTE — Procedures (Signed)
Patient Name: Erin Holt, Spang Date: 07/14/2016 Gender: Female D.O.B: 1950-12-07 Age (years): 65 Referring Provider: Tarri Fuller Height (inches): 64 Interpreting Physician: Shelva Majestic MD, ABSM Weight (lbs): 240 RPSGT: Baxter Flattery BMI: 41 MRN: [id number] Neck Size: 15.00  CLINICAL INFORMATION Sleep Study Type: Split Night CPAP Indication for sleep study: Fatigue, Obesity, OSA, Snoring, Witnessed Apneas Epworth Sleepiness Score: 4  SLEEP STUDY TECHNIQUE As per the AASM Manual for the Scoring of Sleep and Associated Events v2.3 (April 2016) with a hypopnea requiring 4% desaturations. The channels recorded and monitored were frontal, central and occipital EEG, electrooculogram (EOG), submentalis EMG (chin), nasal and oral airflow, thoracic and abdominal wall motion, anterior tibialis EMG, snore microphone, electrocardiogram, and pulse oximetry. Continuous positive airway pressure (CPAP) was initiated when the patient met split night criteria and was titrated according to treat sleep-disordered breathing.  MEDICATIONS allopurinol (ZYLOPRIM) 100 MG tablet atorvastatin (LIPITOR) 40 MG tablet diltiazem (CARDIZEM CD) 240 MG 24 hr capsule latanoprost (XALATAN) 0.005 % ophthalmic solution losartan (COZAAR) 50 MG tablet rivaroxaban (XARELTO) 20 MG TABS tablet  Medications administered by patient during sleep study : No sleep medicine administered.  RESPIRATORY PARAMETERS Diagnostic Total AHI (/hr): 45.9 RDI (/hr): 48.8 OA Index (/hr): 36.5 CA Index (/hr): 0.0 REM AHI (/hr): 87.5 NREM AHI (/hr): 41.5 Supine AHI (/hr): N/A Non-supine AHI (/hr): 45.87 Min O2 Sat (%): 79.00 Mean O2 (%): 96.30 Time below 88% (min): 1.4   Titration Optimal Pressure (cm): 11 AHI at Optimal Pressure (/hr): 0.0 Min O2 at Optimal Pressure (%): 96.0 Supine % at Optimal (%): 0 Sleep % at Optimal (%): 92    SLEEP ARCHITECTURE The recording time for the entire night was 360.0 minutes. During a  baseline period of 176.4 minutes, the patient slept for 121.7 minutes in REM and nonREM, yielding a sleep efficiency of 69.0%. Sleep onset after lights out was 12.8 minutes with a REM latency of 150.5 minutes. Wake after sleep onset (WASO) was 42 minutes.The patient spent 15.62% of the night in stage N1 sleep, 62.06% in stage N2 sleep, 12.74% in stage N3 and 9.58% in REM.   During the titration period of 174.2 minutes, the patient slept for 156.5 minutes in REM and nonREM, yielding a sleep efficiency of 89.9%. Sleep onset after CPAP initiation was 7.9 minutes with a REM latency of 41.5 minutes. The patient spent 9.58% of the night in stage N1 sleep, 57.83% in stage N2 sleep, 1.92% in stage N3 and 30.67% in REM.  CARDIAC DATA The 2 lead EKG demonstrated sinus rhythm. The mean heart rate was 79.80 beats per minute. Other EKG findings include: None.  LEG MOVEMENT DATA The total Periodic Limb Movements of Sleep (PLMS) were 0. The PLMS index was 0.00 .  IMPRESSIONS - Severe obstructive sleep apnea occurred during the diagnostic portion of the study (AHI 45.9/hour); events were very severe during REM sleep (AHI 87.5/h). An optimal PAP pressure was selected for this patient ( 11 cm of water); AHI o and oxygen nadir 96%. - No significant central sleep apnea occurred during the diagnostic portion of the study (CAI = 0.0/hour). - Abnormal slep archetecture with prolonged latency to REM sleep.  - Significant oxygen desaturation during the diagnostic portion of the study to a nadir of 79.00%. - Loud snoring during the diagnostic study. - No cardiac abnormalities were noted during this study. - Clinically significant periodic limb movements did not occur during sleep.  DIAGNOSIS - Obstructive Sleep Apnea (327.23 [G47.33 ICD-10])  RECOMMENDATIONS -  Recommend an initial trial of CPAP therapy with EPR at 11 cm H2O with heated humidification.  A Medium size Resmed Full Face Mask AirFit F20 mask was used for  the titration. - Efforts should be made to optimize nasal and oropharyngeal patency. - Avoid alcohol, sedatives and other CNS depressants that may worsen sleep apnea and disrupt normal sleep architecture. - Sleep hygiene should be reviewed to assess factors that may improve sleep quality. - Weight management and regular exercise should be initiated or continued. - Recommend a download in 30days and sleep clinic evluaton.  [Electronically signed] 07/16/2016 07:19 PM  Shelva Majestic MD, [facility], [place], American Board of Sleep Medicine   NPI: [id number] [place] PH: [phone]   FX: [phone] [place]

## 2016-08-12 ENCOUNTER — Telehealth: Payer: Self-pay | Admitting: Cardiology

## 2016-08-12 NOTE — Telephone Encounter (Signed)
New Message  Pt call requesting to speak with RN about sleep study results. Please call back to discuss

## 2016-08-12 NOTE — Telephone Encounter (Signed)
I returned call and spoke to patient. Pt had sleep study done 9/14. She has not received results.  I explained that I couldn't see a result note on this and would forward to Dr. Claiborne Billings. She was understanding of situation. Aware we will follow up once report is read. She wants to wait to schedule a follow up appt after receiving results.

## 2016-08-16 DIAGNOSIS — Z23 Encounter for immunization: Secondary | ICD-10-CM | POA: Diagnosis not present

## 2016-08-16 DIAGNOSIS — Z7984 Long term (current) use of oral hypoglycemic drugs: Secondary | ICD-10-CM | POA: Diagnosis not present

## 2016-08-16 DIAGNOSIS — I6990 Unspecified sequelae of unspecified cerebrovascular disease: Secondary | ICD-10-CM | POA: Diagnosis not present

## 2016-08-16 DIAGNOSIS — R7301 Impaired fasting glucose: Secondary | ICD-10-CM | POA: Diagnosis not present

## 2016-08-16 DIAGNOSIS — G4733 Obstructive sleep apnea (adult) (pediatric): Secondary | ICD-10-CM | POA: Diagnosis not present

## 2016-08-16 DIAGNOSIS — I482 Chronic atrial fibrillation: Secondary | ICD-10-CM | POA: Diagnosis not present

## 2016-08-16 DIAGNOSIS — R5381 Other malaise: Secondary | ICD-10-CM | POA: Diagnosis not present

## 2016-08-16 DIAGNOSIS — M1A09X0 Idiopathic chronic gout, multiple sites, without tophus (tophi): Secondary | ICD-10-CM | POA: Diagnosis not present

## 2016-08-16 DIAGNOSIS — Z6841 Body Mass Index (BMI) 40.0 and over, adult: Secondary | ICD-10-CM | POA: Diagnosis not present

## 2016-08-16 DIAGNOSIS — Z780 Asymptomatic menopausal state: Secondary | ICD-10-CM | POA: Diagnosis not present

## 2016-08-16 DIAGNOSIS — I10 Essential (primary) hypertension: Secondary | ICD-10-CM | POA: Diagnosis not present

## 2016-08-21 NOTE — Telephone Encounter (Signed)
Pt has severe OSA with oxygen desaturation; CPAP was recommended to start at 11 cm;  Will forward to Mariann Laster to make sure pt and DME company were notified to have pt set up for CPAP and for f/u sleep clinic eval after treatment initiated

## 2016-08-23 DIAGNOSIS — H401131 Primary open-angle glaucoma, bilateral, mild stage: Secondary | ICD-10-CM | POA: Diagnosis not present

## 2016-08-28 ENCOUNTER — Other Ambulatory Visit: Payer: Self-pay | Admitting: Cardiology

## 2016-08-29 NOTE — Telephone Encounter (Signed)
Returned call to patient. She wanted to make sure her sleep study was in the system before scheduling with Dr Percival Spanish. Appt scheduled with Hochrein 10/04/16 and patient verbalized understanding. She is aware that the CPAP is in the process. Will route to Bee as FYI.

## 2016-08-29 NOTE — Telephone Encounter (Signed)
Pt calling to fu to see if we have received her sleep study records yet, she is not sure of the name other than [facility] before she makes an appt with Hochrein

## 2016-09-25 ENCOUNTER — Other Ambulatory Visit: Payer: Self-pay | Admitting: Cardiology

## 2016-10-04 ENCOUNTER — Ambulatory Visit: Payer: Medicare Other | Admitting: Cardiology

## 2016-10-13 ENCOUNTER — Other Ambulatory Visit: Payer: Self-pay | Admitting: Gastroenterology

## 2016-10-15 NOTE — Progress Notes (Signed)
HPI The patient presents for follow up of  atrial fib.  She was going to have cardioversion but actually went back into sinus rhythm on her own.   Later on follow up she was in atrial fib but without symptoms.  Holter demonstrated atrial fib with controlled rate.  We opted for rate control and anticoagulation.  Since I last saw her she has done well from a cardiac standpoint.  The patient denies any new symptoms such as chest discomfort, neck or arm discomfort. There has been no new shortness of breath, PND or orthopnea. There have been no reported palpitations, presyncope or syncope.  She did have a repeat sleep study in Sept last year.  She has not heard back from this.  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   No Known Allergies  Current Outpatient Prescriptions  Medication Sig Dispense Refill  . allopurinol (ZYLOPRIM) 100 MG tablet Take 100 mg by mouth every evening.    Marland Kitchen atorvastatin (LIPITOR) 40 MG tablet Take 1 tablet by mouth daily.  0  . diltiazem (CARDIZEM CD) 240 MG 24 hr capsule Take 1 capsule by mouth daily.  0  . latanoprost (XALATAN) 0.005 % ophthalmic solution Place 1 drop into both eyes at bedtime.    Marland Kitchen losartan (COZAAR) 50 MG tablet Take 1 tablet (50 mg total) by mouth daily. 90 tablet 1  . XARELTO 20 MG TABS tablet take 1 tablet by mouth once daily WITH SUPPER 90 tablet 1   No current facility-administered medications for this visit.     Past Medical History:  Diagnosis Date  . Arthritis    "left knee" (10/09/2012)  . Atrial fibrillation ([place])   . Carotid artery occlusion    a. Carotid US (12/10/13):  R 60-79%; L 1-39% - f/u 6 mos  . Glaucoma (increased eye pressure)    "both eyes" (10/09/2012)  . Gout   . Hx of echocardiogram    a. Echo (09/2012):  Mild LVH, EF 55-65%, Gr 1 DD, MAC, mild MR, mild to mod LAE, mild RVE, PASP 44 mmHg  . Hypertension   . Stroke [facility]) 10/09/2012    Past Surgical History:  Procedure Laterality  Date  . BREAST BIOPSY  1980's   "left; it wasn't anything" (10/09/2012)  . KNEE ARTHROSCOPY W/ DEBRIDEMENT  2011   "left; for arthritis" (10/09/2012)    ROS:  As stated in the HPI and negative for all other systems.   PHYSICAL EXAM BP (!) 144/102   Pulse 91   Ht 5\' 5"  (1.651 m)   Wt 248 lb 12.8 oz (112.9 kg)   BMI 41.40 kg/m  GENERAL:  Well appearing NECK:  No jugular venous distention, waveform within normal limits, carotid upstroke brisk and symmetric, no bruits, no thyromegaly LUNGS:  Clear to auscultation bilaterally BACK:  No CVA tenderness CHEST:  Unremarkable HEART:  PMI not displaced or sustained,S1 and S2 within normal limits, no S3, no clicks, no rubs, no murmurs, irregular ABD:  Flat, positive bowel sounds normal in frequency in pitch, no bruits, no rebound, no guarding, no midline pulsatile mass, no hepatomegaly, no splenomegaly EXT:  2 plus pulses throughout, trace edema, no cyanosis no clubbing  EKG:  Atrial fib, rate 91, axis WNL, intervals WNL, PVC, no acute ST T wave changes.  10/17/2016   ASSESSMENT AND PLAN  ATRIAL FIB:  At this point I don't think there is anything advantage to cardioversion or antiarrhythmic therapy.  We have discussed this  Erin Holt has a CHA2DS2 - VASc score of 4 with a risk of stroke of 4.  She does not feel that fibrillation. I will pursue rate control and anticoagulation.    I will be checking labs today to include CBC and TSH.    HTN:  Her blood pressure seems to be well controlled at home but it is elevated today.  She will keep a BP diary.  For now I will not change her meds. .    CAROTID STENOSIS:  She had 40 - 59% stenosis on the right .  We will arrange for follow up early next year.   CARDIOMYOPATHY:   I did repeat her echocardiogram her EF was well-preserved that she has some moderate TR. I will follow this in the future.  This was improved over previous.   SLEEP APNEA:  The patient has a positive sleep study.   Unfortunately [employer] did not cover her CPAP last year.  She had a repeat study this year.  We had her seen in the office today by our Sleep Nurse

## 2016-10-17 ENCOUNTER — Other Ambulatory Visit: Payer: Self-pay | Admitting: *Deleted

## 2016-10-17 ENCOUNTER — Ambulatory Visit (INDEPENDENT_AMBULATORY_CARE_PROVIDER_SITE_OTHER): Payer: Medicare Other | Admitting: Cardiology

## 2016-10-17 ENCOUNTER — Encounter: Payer: Self-pay | Admitting: Cardiology

## 2016-10-17 VITALS — BP 144/102 | HR 91 | Ht 65.0 in | Wt 248.8 lb

## 2016-10-17 DIAGNOSIS — E785 Hyperlipidemia, unspecified: Secondary | ICD-10-CM

## 2016-10-17 DIAGNOSIS — I10 Essential (primary) hypertension: Secondary | ICD-10-CM

## 2016-10-17 DIAGNOSIS — G4733 Obstructive sleep apnea (adult) (pediatric): Secondary | ICD-10-CM

## 2016-10-17 DIAGNOSIS — I6523 Occlusion and stenosis of bilateral carotid arteries: Secondary | ICD-10-CM

## 2016-10-17 DIAGNOSIS — G4730 Sleep apnea, unspecified: Secondary | ICD-10-CM

## 2016-10-17 DIAGNOSIS — I4819 Other persistent atrial fibrillation: Secondary | ICD-10-CM

## 2016-10-17 DIAGNOSIS — I481 Persistent atrial fibrillation: Secondary | ICD-10-CM

## 2016-10-17 LAB — LIPID PANEL
CHOL/HDL RATIO: 2.3 ratio (ref <5.0)
Cholesterol: 114 mg/dL (ref <200)
HDL: 49 mg/dL — AB (ref >50)
LDL Cholesterol: 47 mg/dL (ref <100)
Triglycerides: 89 mg/dL (ref <150)
VLDL: 18 mg/dL (ref <30)

## 2016-10-17 LAB — COMPREHENSIVE METABOLIC PANEL
ALT: 12 U/L (ref 6–29)
AST: 17 U/L (ref 10–35)
Albumin: 3.8 g/dL (ref 3.6–5.1)
Alkaline Phosphatase: 118 U/L (ref 33–130)
BUN: 21 mg/dL (ref 7–25)
CHLORIDE: 107 mmol/L (ref 98–110)
CO2: 25 mmol/L (ref 20–31)
Calcium: 9.3 mg/dL (ref 8.6–10.4)
Creat: 0.99 mg/dL (ref 0.50–0.99)
GLUCOSE: 84 mg/dL (ref 65–99)
POTASSIUM: 4.8 mmol/L (ref 3.5–5.3)
Sodium: 141 mmol/L (ref 135–146)
TOTAL PROTEIN: 7.2 g/dL (ref 6.1–8.1)
Total Bilirubin: 0.6 mg/dL (ref 0.2–1.2)

## 2016-10-17 LAB — CBC
HCT: 38.7 % (ref 35.0–45.0)
Hemoglobin: 12.6 g/dL (ref 11.7–15.5)
MCH: 28.5 pg (ref 27.0–33.0)
MCHC: 32.6 g/dL (ref 32.0–36.0)
MCV: 87.6 fL (ref 80.0–100.0)
MPV: 11 fL (ref 7.5–12.5)
PLATELETS: 232 10*3/uL (ref 140–400)
RBC: 4.42 MIL/uL (ref 3.80–5.10)
RDW: 14.7 % (ref 11.0–15.0)
WBC: 8.7 10*3/uL (ref 3.8–10.8)

## 2016-10-17 NOTE — Patient Instructions (Addendum)
Medication Instructions:  Continue current medications  Labwork: CBC, CMP, TSH and Fasting Lipids  Testing/Procedures: Your physician has requested that you have a carotid duplex In January. This test is an ultrasound of the carotid arteries in your neck. It looks at blood flow through these arteries that supply the brain with blood. Allow one hour for this exam. There are no restrictions or special instructions.  Follow-Up: Your physician wants you to follow-up in: 1 Year. You will receive a reminder letter in the mail two months in advance. If you don't receive a letter, please call our office to schedule the follow-up appointment.   Any Other Special Instructions Will Be Listed Below (If Applicable).         HAPPY HOLIDAY  If you need a refill on your cardiac medications before your next appointment, please call your pharmacy.

## 2016-10-18 LAB — TSH: TSH: 1.5 m[IU]/L

## 2016-10-26 ENCOUNTER — Telehealth: Payer: Self-pay | Admitting: Cardiology

## 2016-10-26 NOTE — Telephone Encounter (Signed)
Returned call to patient and informed her samples are available for pickup. She voiced thorough appreciation. Aware to call if any further needs.

## 2016-10-26 NOTE — Telephone Encounter (Signed)
Patient calling the office for samples of medication:   1.  What medication and dosage are you requesting samples for? Xarelto  2.  Are you currently out of this medication?  Pt need 5 tablets,she is in the donut hole

## 2016-11-08 DIAGNOSIS — R040 Epistaxis: Secondary | ICD-10-CM | POA: Diagnosis not present

## 2016-11-15 ENCOUNTER — Encounter (HOSPITAL_COMMUNITY): Payer: Medicare Other

## 2016-11-16 ENCOUNTER — Inpatient Hospital Stay (HOSPITAL_COMMUNITY): Admission: RE | Admit: 2016-11-16 | Payer: Medicare Other | Source: Ambulatory Visit

## 2016-11-25 ENCOUNTER — Ambulatory Visit (HOSPITAL_COMMUNITY)
Admission: RE | Admit: 2016-11-25 | Discharge: 2016-11-25 | Disposition: A | Discharge status: Home / Self Care | Payer: Medicare Other | Source: Ambulatory Visit | Attending: Cardiology | Admitting: Cardiology

## 2016-11-25 DIAGNOSIS — I4891 Unspecified atrial fibrillation: Secondary | ICD-10-CM | POA: Diagnosis not present

## 2016-11-25 DIAGNOSIS — Z8673 Personal history of transient ischemic attack (TIA), and cerebral infarction without residual deficits: Secondary | ICD-10-CM | POA: Insufficient documentation

## 2016-11-25 DIAGNOSIS — E785 Hyperlipidemia, unspecified: Secondary | ICD-10-CM | POA: Diagnosis not present

## 2016-11-25 DIAGNOSIS — I6523 Occlusion and stenosis of bilateral carotid arteries: Secondary | ICD-10-CM | POA: Insufficient documentation

## 2016-11-25 DIAGNOSIS — I10 Essential (primary) hypertension: Secondary | ICD-10-CM | POA: Diagnosis not present

## 2016-11-28 ENCOUNTER — Other Ambulatory Visit: Payer: Self-pay | Admitting: *Deleted

## 2016-11-28 ENCOUNTER — Telehealth: Payer: Self-pay | Admitting: Cardiology

## 2016-11-28 DIAGNOSIS — R5383 Other fatigue: Secondary | ICD-10-CM | POA: Diagnosis not present

## 2016-11-28 DIAGNOSIS — I482 Chronic atrial fibrillation: Secondary | ICD-10-CM | POA: Diagnosis not present

## 2016-11-28 DIAGNOSIS — G4733 Obstructive sleep apnea (adult) (pediatric): Secondary | ICD-10-CM | POA: Diagnosis not present

## 2016-11-28 DIAGNOSIS — R0609 Other forms of dyspnea: Secondary | ICD-10-CM | POA: Diagnosis not present

## 2016-11-28 DIAGNOSIS — I6523 Occlusion and stenosis of bilateral carotid arteries: Secondary | ICD-10-CM

## 2016-11-28 DIAGNOSIS — R350 Frequency of micturition: Secondary | ICD-10-CM | POA: Diagnosis not present

## 2016-11-28 NOTE — Telephone Encounter (Signed)
New message ° ° ° °Pt verbalized that she is returning call for rn  °

## 2016-11-28 NOTE — Telephone Encounter (Signed)
Please call.

## 2016-11-28 NOTE — Telephone Encounter (Signed)
Returned call to patient. Notified her of carotid doppler results.

## 2016-12-12 ENCOUNTER — Encounter (HOSPITAL_COMMUNITY)
Admission: RE | Disposition: A | Discharge status: Home / Self Care | Payer: Self-pay | Source: Ambulatory Visit | Attending: Gastroenterology

## 2016-12-12 ENCOUNTER — Ambulatory Visit (HOSPITAL_COMMUNITY): Payer: Medicare Other | Admitting: Anesthesiology

## 2016-12-12 ENCOUNTER — Encounter (HOSPITAL_COMMUNITY): Payer: Self-pay

## 2016-12-12 ENCOUNTER — Ambulatory Visit (HOSPITAL_COMMUNITY)
Admission: RE | Admit: 2016-12-12 | Discharge: 2016-12-12 | Disposition: A | Discharge status: Home / Self Care | Payer: Medicare Other | Source: Ambulatory Visit | Attending: Gastroenterology | Admitting: Gastroenterology

## 2016-12-12 DIAGNOSIS — Z7901 Long term (current) use of anticoagulants: Secondary | ICD-10-CM | POA: Diagnosis not present

## 2016-12-12 DIAGNOSIS — Z96652 Presence of left artificial knee joint: Secondary | ICD-10-CM | POA: Diagnosis not present

## 2016-12-12 DIAGNOSIS — I739 Peripheral vascular disease, unspecified: Secondary | ICD-10-CM | POA: Insufficient documentation

## 2016-12-12 DIAGNOSIS — Z8673 Personal history of transient ischemic attack (TIA), and cerebral infarction without residual deficits: Secondary | ICD-10-CM | POA: Insufficient documentation

## 2016-12-12 DIAGNOSIS — Z86010 Personal history of colonic polyps: Secondary | ICD-10-CM | POA: Diagnosis not present

## 2016-12-12 DIAGNOSIS — I10 Essential (primary) hypertension: Secondary | ICD-10-CM | POA: Diagnosis not present

## 2016-12-12 DIAGNOSIS — D125 Benign neoplasm of sigmoid colon: Secondary | ICD-10-CM | POA: Insufficient documentation

## 2016-12-12 DIAGNOSIS — Z79899 Other long term (current) drug therapy: Secondary | ICD-10-CM | POA: Diagnosis not present

## 2016-12-12 DIAGNOSIS — I482 Chronic atrial fibrillation: Secondary | ICD-10-CM | POA: Diagnosis not present

## 2016-12-12 DIAGNOSIS — Z1211 Encounter for screening for malignant neoplasm of colon: Secondary | ICD-10-CM | POA: Insufficient documentation

## 2016-12-12 HISTORY — PX: COLONOSCOPY WITH PROPOFOL: SHX5780

## 2016-12-12 SURGERY — COLONOSCOPY WITH PROPOFOL
Anesthesia: Monitor Anesthesia Care

## 2016-12-12 MED ORDER — PROPOFOL 500 MG/50ML IV EMUL
INTRAVENOUS | Status: DC | PRN
  Administered 2016-12-12: 50 mg via INTRAVENOUS

## 2016-12-12 MED ORDER — LACTATED RINGERS IV SOLN
INTRAVENOUS | Status: DC | PRN
  Administered 2016-12-12: 13:00:00 via INTRAVENOUS

## 2016-12-12 MED ORDER — PROPOFOL 500 MG/50ML IV EMUL
INTRAVENOUS | Status: DC | PRN
  Administered 2016-12-12: 100 ug/kg/min via INTRAVENOUS

## 2016-12-12 MED ORDER — PROPOFOL 10 MG/ML IV BOLUS
INTRAVENOUS | Status: AC
  Filled 2016-12-12: qty 40

## 2016-12-12 SURGICAL SUPPLY — 21 items

## 2016-12-12 NOTE — Transfer of Care (Signed)
Immediate Anesthesia Transfer of Care Note  Patient: Erin Holt  Procedure(s) Performed: Procedure(s): COLONOSCOPY WITH PROPOFOL (N/A)  Patient Location: PACU  Anesthesia Type:MAC  Level of Consciousness: awake, alert  and oriented  Airway & Oxygen Therapy: Patient Spontanous Breathing and Patient connected to face mask oxygen  Post-op Assessment: Report given to RN and Post -op Vital signs reviewed and stable  Post vital signs: Reviewed and stable  Last Vitals:  Vitals:   12/12/16 1231  BP: (!) 170/115  Pulse: (!) 101  Resp: 14  Temp: 36.6 C    Last Pain:  Vitals:   12/12/16 1231  TempSrc: Oral         Complications: No apparent anesthesia complications

## 2016-12-12 NOTE — Op Note (Signed)
[facility] Patient Name: Erin Holt Procedure Date: 12/12/2016 MRN: [id number] Attending MD: Garlan Fair , MD Date of Birth: 1950-11-08 CSN: [id number] Age: 66 Admit Type: Outpatient Procedure:                Colonoscopy Indications:              High risk colon cancer surveillance: 05/06/2008                            colonoscopy was performed with removal of a 7 mm                            tubulovillous adenomatous descending colon polyp Providers:                Garlan Fair, MD, Hilma Favors, RN, Alfonso Patten, Technician, Rosario Adie, CRNA Referring MD:              Medicines:                Propofol per Anesthesia Complications:            No immediate complications. Estimated Blood Loss:     Estimated blood loss was minimal. Procedure:                Pre-Anesthesia Assessment:                           - Prior to the procedure, a History and Physical                            was performed, and patient medications and                            allergies were reviewed. The patient's tolerance of                            previous anesthesia was also reviewed. The risks                            and benefits of the procedure and the sedation                            options and risks were discussed with the patient.                            All questions were answered, and informed consent                            was obtained. Prior Anticoagulants: The patient has                            taken Xarelto (rivaroxaban), last dose was 4 days  prior to procedure. ASA Grade Assessment: II - A                            patient with mild systemic disease. After reviewing                            the risks and benefits, the patient was deemed in                            satisfactory condition to undergo the procedure.                           After obtaining informed consent, the  colonoscope                            was passed under direct vision. Throughout the                            procedure, the patient's blood pressure, pulse, and                            oxygen saturations were monitored continuously. The                            [id number]) scope was introduced through                            the anus and advanced to the the cecum, identified                            by appendiceal orifice and ileocecal valve. The                            colonoscopy was performed without difficulty. The                            patient tolerated the procedure well. The quality                            of the bowel preparation was good. The ileocecal                            valve, the appendiceal orifice and the rectum were                            photographed. Scope In: 1:25:21 PM Scope Out: 1:41:58 PM Scope Withdrawal Time: 0 hours 10 minutes 25 seconds  Total Procedure Duration: 0 hours 16 minutes 37 seconds  Findings:      The perianal and digital rectal examinations were normal.      A 4 mm polyp was found in the proximal sigmoid colon. The polyp was       sessile. The polyp was removed with a cold snare. Resection and       retrieval were  complete.      The exam was otherwise without abnormality. Impression:               - One 4 mm polyp in the proximal sigmoid colon,                            removed with a cold snare. Resected and retrieved.                           - The examination was otherwise normal. Moderate Sedation:      N/A- Per Anesthesia Care Recommendation:           - Patient has a contact number available for                            emergencies. The signs and symptoms of potential                            delayed complications were discussed with the                            patient. Return to normal activities tomorrow.                            Written discharge instructions were provided to the                             patient.                           - Repeat colonoscopy in 5 years for surveillance.                           - Resume previous diet.                           - Continue present medications. Procedure Code(s):        --- Professional ---                           [phone], Colonoscopy, flexible; with removal of                            tumor(s), polyp(s), or other lesion(s) by snare                            technique Diagnosis Code(s):        --- Professional ---                           Z86.010, Personal history of colonic polyps                           D12.5, Benign neoplasm of sigmoid colon CPT copyright 2016 American Medical Association. All rights reserved. The codes documented in this report are preliminary and upon coder review may  be revised to meet  current compliance requirements. Earle Gell, MD Garlan Fair, MD 12/12/2016 1:50:56 PM This report has been signed electronically. Number of Addenda: 0

## 2016-12-12 NOTE — Anesthesia Preprocedure Evaluation (Addendum)
Anesthesia Evaluation  Patient identified by MRN, date of birth, ID band Patient awake    Reviewed: Allergy & Precautions, NPO status , Patient's Chart, lab work & pertinent test results  Airway Mallampati: II  TM Distance: >3 FB Neck ROM: Full    Dental no notable dental hx.    Pulmonary neg pulmonary ROS,    Pulmonary exam normal breath sounds clear to auscultation       Cardiovascular hypertension, + Peripheral Vascular Disease  Normal cardiovascular exam Rhythm:Regular Rate:Normal     Neuro/Psych CVA negative psych ROS   GI/Hepatic negative GI ROS, Neg liver ROS,   Endo/Other  negative endocrine ROS  Renal/GU negative Renal ROS  negative genitourinary   Musculoskeletal negative musculoskeletal ROS (+)   Abdominal   Peds negative pediatric ROS (+)  Hematology negative hematology ROS (+)   Anesthesia Other Findings   Reproductive/Obstetrics negative OB ROS                             Anesthesia Physical Anesthesia Plan  ASA: III  Anesthesia Plan: MAC   Post-op Pain Management:    Induction: Intravenous  Airway Management Planned: Nasal Cannula  Additional Equipment:   Intra-op Plan:   Post-operative Plan: Extubation in OR  Informed Consent: I have reviewed the patients History and Physical, chart, labs and discussed the procedure including the risks, benefits and alternatives for the proposed anesthesia with the patient or authorized representative who has indicated his/her understanding and acceptance.   Dental advisory given  Plan Discussed with: CRNA and Surgeon  Anesthesia Plan Comments:         Anesthesia Quick Evaluation

## 2016-12-12 NOTE — Anesthesia Postprocedure Evaluation (Signed)
Anesthesia Post Note  Patient: Erin Holt  Procedure(s) Performed: Procedure(s) (LRB): COLONOSCOPY WITH PROPOFOL (N/A)  Patient location during evaluation: PACU Anesthesia Type: MAC Level of consciousness: awake and alert Pain management: pain level controlled Vital Signs Assessment: post-procedure vital signs reviewed and stable Respiratory status: spontaneous breathing, nonlabored ventilation, respiratory function stable and patient connected to nasal cannula oxygen Cardiovascular status: stable and blood pressure returned to baseline Anesthetic complications: no       Last Vitals:  Vitals:   12/12/16 1231 12/12/16 1349  BP: (!) 170/115   Pulse: (!) 101 90  Resp: 14 16  Temp: 36.6 C     Last Pain:  Vitals:   12/12/16 1349  TempSrc: Oral                 Phillip Sandler S

## 2016-12-12 NOTE — H&P (Signed)
Procedure: Surveillance colonoscopy. 05/06/2008 colonoscopy was performed with removal of a 7 mm tubulovillous descending colon polyp. Chronic atrial fibrillation. Chronic xarelto anticoagulation  History: The patient is a 66 year old female born Dec 16, 1950. She is scheduled to undergo a surveillance colonoscopy today. She stopped taking xarelto four days ago.  Past medical history: Hypertension. Lumbar radiculopathy. Acute intracerebral stroke. Atrial fibrillation. Left knee arthroscopy.  Exam: The patient is alert and lying comfortably on the endoscopy stretcher. Abdomen is soft and nontender to palpation. Lungs are clear to auscultation. Cardiac exam reveals a regular rhythm. Patient has a history of age fibrillation.  Plan: Proceed with surveillance colonoscopy

## 2016-12-12 NOTE — Discharge Instructions (Signed)

## 2016-12-16 ENCOUNTER — Encounter (HOSPITAL_COMMUNITY): Payer: Self-pay | Admitting: Gastroenterology

## 2016-12-20 ENCOUNTER — Other Ambulatory Visit (HOSPITAL_COMMUNITY)
Admission: RE | Admit: 2016-12-20 | Discharge: 2016-12-20 | Disposition: A | Discharge status: Home / Self Care | Payer: Medicare Other | Source: Ambulatory Visit | Attending: Internal Medicine | Admitting: Internal Medicine

## 2016-12-20 ENCOUNTER — Other Ambulatory Visit: Payer: Self-pay | Admitting: Internal Medicine

## 2016-12-20 DIAGNOSIS — I482 Chronic atrial fibrillation: Secondary | ICD-10-CM | POA: Diagnosis not present

## 2016-12-20 DIAGNOSIS — I6529 Occlusion and stenosis of unspecified carotid artery: Secondary | ICD-10-CM | POA: Diagnosis not present

## 2016-12-20 DIAGNOSIS — Z86010 Personal history of colonic polyps: Secondary | ICD-10-CM | POA: Diagnosis not present

## 2016-12-20 DIAGNOSIS — Z01419 Encounter for gynecological examination (general) (routine) without abnormal findings: Secondary | ICD-10-CM | POA: Insufficient documentation

## 2016-12-20 DIAGNOSIS — I10 Essential (primary) hypertension: Secondary | ICD-10-CM | POA: Diagnosis not present

## 2016-12-20 DIAGNOSIS — I6990 Unspecified sequelae of unspecified cerebrovascular disease: Secondary | ICD-10-CM | POA: Diagnosis not present

## 2016-12-20 DIAGNOSIS — M109 Gout, unspecified: Secondary | ICD-10-CM | POA: Diagnosis not present

## 2016-12-20 DIAGNOSIS — Z1151 Encounter for screening for human papillomavirus (HPV): Secondary | ICD-10-CM | POA: Insufficient documentation

## 2016-12-20 DIAGNOSIS — Z1389 Encounter for screening for other disorder: Secondary | ICD-10-CM | POA: Diagnosis not present

## 2016-12-20 DIAGNOSIS — Z7189 Other specified counseling: Secondary | ICD-10-CM | POA: Diagnosis not present

## 2016-12-20 DIAGNOSIS — Z0000 Encounter for general adult medical examination without abnormal findings: Secondary | ICD-10-CM | POA: Diagnosis not present

## 2016-12-20 DIAGNOSIS — Z23 Encounter for immunization: Secondary | ICD-10-CM | POA: Diagnosis not present

## 2016-12-20 DIAGNOSIS — Z6841 Body Mass Index (BMI) 40.0 and over, adult: Secondary | ICD-10-CM | POA: Diagnosis not present

## 2016-12-20 DIAGNOSIS — G4733 Obstructive sleep apnea (adult) (pediatric): Secondary | ICD-10-CM | POA: Diagnosis not present

## 2016-12-21 ENCOUNTER — Other Ambulatory Visit: Payer: Self-pay | Admitting: *Deleted

## 2016-12-21 DIAGNOSIS — G4733 Obstructive sleep apnea (adult) (pediatric): Secondary | ICD-10-CM

## 2016-12-21 DIAGNOSIS — Z9989 Dependence on other enabling machines and devices: Principal | ICD-10-CM

## 2016-12-23 LAB — CYTOLOGY - PAP
DIAGNOSIS: NEGATIVE
HPV: NOT DETECTED

## 2017-01-11 DIAGNOSIS — M109 Gout, unspecified: Secondary | ICD-10-CM | POA: Diagnosis not present

## 2017-01-30 ENCOUNTER — Encounter: Payer: Self-pay | Admitting: Cardiology

## 2017-02-09 ENCOUNTER — Other Ambulatory Visit: Payer: Self-pay | Admitting: Cardiology

## 2017-02-10 ENCOUNTER — Telehealth: Payer: Self-pay | Admitting: Cardiology

## 2017-02-10 NOTE — Telephone Encounter (Signed)
Pt says she was checking to see if you have received her records from her Sleep Study? She is scheduled to see Dr Warren Lacy on 02-14-17 and wanted to be sure her results and everything was in place.

## 2017-02-10 NOTE — Telephone Encounter (Signed)
Returned the phone call to the patient to inform her that her sleep study results are available for Dr. Percival Spanish for her next appointment.

## 2017-02-13 NOTE — Progress Notes (Signed)
HPI The patient presents for follow up of  atrial fib.  She was going to have cardioversion but actually went back into sinus rhythm on her own.   Later on follow up she was in atrial fib but without symptoms.  Holter demonstrated atrial fib with controlled rate.  We opted for rate control and anticoagulation.  At the last visit she had not yet had treatment for a positive sleep study.  However, since then she has been treated.  She is feeling well.  The patient denies any new symptoms such as chest discomfort, neck or arm discomfort. There has been no new shortness of breath, PND or orthopnea. There have been no reported palpitations, presyncope or syncope.  She is walking at work but not exercising.  She does have much better sleep hygiene.  She does not get up to go to the bathroom frequently.    No Known Allergies  Current Outpatient Prescriptions  Medication Sig Dispense Refill  . allopurinol (ZYLOPRIM) 100 MG tablet Take 100 mg by mouth every evening.    Marland Kitchen atorvastatin (LIPITOR) 40 MG tablet Take 40 mg by mouth every evening.   0  . latanoprost (XALATAN) 0.005 % ophthalmic solution Place 1 drop into both eyes at bedtime.    Marland Kitchen losartan (COZAAR) 50 MG tablet take 1 tablet by mouth once daily 90 tablet 1  . XARELTO 20 MG TABS tablet take 1 tablet by mouth once daily WITH SUPPER 90 tablet 1  . diltiazem (CARTIA XT) 240 MG 24 hr capsule Take 240 mg by mouth daily.     No current facility-administered medications for this visit.     Past Medical History:  Diagnosis Date  . Arthritis    "left knee" (10/09/2012)  . Atrial fibrillation ([place])   . Carotid artery occlusion    a. Carotid US (12/10/13):  R 60-79%; L 1-39% - f/u 6 mos  . Glaucoma (increased eye pressure)    "both eyes" (10/09/2012)  . Gout   . Hx of echocardiogram    a. Echo (09/2012):  Mild LVH, EF 55-65%, Gr 1 DD, MAC, mild MR, mild to mod LAE, mild RVE, PASP 44 mmHg  . Hypertension   . Stroke [facility]) 10/09/2012    Past  Surgical History:  Procedure Laterality Date  . BREAST BIOPSY  1980's   "left; it wasn't anything" (10/09/2012)  . COLONOSCOPY WITH PROPOFOL N/A 12/12/2016   Procedure: COLONOSCOPY WITH PROPOFOL;  Surgeon: Garlan Fair, MD;  Location: [facility];  Service: Endoscopy;  Laterality: N/A;  . KNEE ARTHROSCOPY W/ DEBRIDEMENT  2011   "left; for arthritis" (10/09/2012)    ROS:  Knee pain.   Otherwise as stated in the HPI and negative for all other systems.   PHYSICAL EXAM BP (!) 126/94   Pulse 84   Ht 5' 4"  (1.626 m)   Wt 240 lb (108.9 kg)   BMI 41.20 kg/m  GENERAL:  Well appearing.  No distress NECK:  No jugular venous distention, waveform within normal limits, carotid upstroke brisk and symmetric, no bruits, no thyromegaly LUNGS:  Clear to auscultation bilaterally BACK:  No CVA tenderness CHEST:  Unremarkable HEART:  PMI not displaced or sustained,S1 and S2 within normal limits, no S3, no clicks, no rubs, no murmurs, irregular. Unchanged from previous exam.   ABD:  Flat, positive bowel sounds normal in frequency in pitch, no bruits, no rebound, no guarding, no midline pulsatile mass, no hepatomegaly, no splenomegaly EXT:  2 plus pulses throughout,  trace edema, no cyanosis no clubbing    ASSESSMENT AND PLAN  ATRIAL FIB:  At this point I don't think there is anything advantage to cardioversion or antiarrhythmic therapy. We have discussed this  Ms. [name] has a CHA2DS2 - VASc score of 4 with a risk of stroke of 4.  She does not feel that fibrillation. I will pursue rate control and anticoagulation.    No further testing or change in meds this visit.   HTN:  Her blood pressure is controlled.  No change in therapy is planned.   CAROTID STENOSIS:  She had 40 - 59% stenosis on the right and less on the left in Jan.  We will follow again in one year.   CARDIOMYOPATHY:   I did repeat her echocardiogram her EF was well-preserved that she has some moderate TR. This was on echo  last year.   I will follow this in the future.  This was improved over previous.   SLEEP APNEA    The patient has a positive sleep study.  She is compliant with her sleep apparatus.

## 2017-02-14 ENCOUNTER — Ambulatory Visit (INDEPENDENT_AMBULATORY_CARE_PROVIDER_SITE_OTHER): Payer: Medicare Other | Admitting: Cardiology

## 2017-02-14 ENCOUNTER — Encounter: Payer: Self-pay | Admitting: Cardiology

## 2017-02-14 VITALS — BP 126/94 | HR 84 | Ht 64.0 in | Wt 240.0 lb

## 2017-02-14 DIAGNOSIS — I480 Paroxysmal atrial fibrillation: Secondary | ICD-10-CM

## 2017-02-14 DIAGNOSIS — G4730 Sleep apnea, unspecified: Secondary | ICD-10-CM | POA: Insufficient documentation

## 2017-02-14 DIAGNOSIS — I6523 Occlusion and stenosis of bilateral carotid arteries: Secondary | ICD-10-CM | POA: Diagnosis not present

## 2017-02-14 NOTE — Patient Instructions (Addendum)

## 2017-04-03 NOTE — Anesthesia Postprocedure Evaluation (Signed)
Anesthesia Post Note  Patient: SUMIYE HIRTH  Procedure(s) Performed: Procedure(s) (LRB): COLONOSCOPY WITH PROPOFOL (N/A)     Anesthesia Post Evaluation  Last Vitals:  Vitals:   12/12/16 1349 12/12/16 1410  BP: 127/77 (!) 122/59  Pulse: 90   Resp: 16   Temp:      Last Pain:  Vitals:   12/12/16 1349  TempSrc: Oral                 Avishai Reihl S

## 2017-04-03 NOTE — Addendum Note (Signed)
Addendum  created 04/03/17 1108 by Myrtie Soman, MD   Sign clinical note

## 2017-04-12 ENCOUNTER — Other Ambulatory Visit: Payer: Self-pay | Admitting: Cardiology

## 2017-04-21 DIAGNOSIS — I6990 Unspecified sequelae of unspecified cerebrovascular disease: Secondary | ICD-10-CM | POA: Diagnosis not present

## 2017-04-21 DIAGNOSIS — I10 Essential (primary) hypertension: Secondary | ICD-10-CM | POA: Diagnosis not present

## 2017-04-21 DIAGNOSIS — M109 Gout, unspecified: Secondary | ICD-10-CM | POA: Diagnosis not present

## 2017-04-21 DIAGNOSIS — Z6841 Body Mass Index (BMI) 40.0 and over, adult: Secondary | ICD-10-CM | POA: Diagnosis not present

## 2017-04-21 DIAGNOSIS — G4733 Obstructive sleep apnea (adult) (pediatric): Secondary | ICD-10-CM | POA: Diagnosis not present

## 2017-04-21 DIAGNOSIS — I482 Chronic atrial fibrillation: Secondary | ICD-10-CM | POA: Diagnosis not present

## 2017-05-10 DIAGNOSIS — H40053 Ocular hypertension, bilateral: Secondary | ICD-10-CM | POA: Diagnosis not present

## 2017-05-10 DIAGNOSIS — H2513 Age-related nuclear cataract, bilateral: Secondary | ICD-10-CM | POA: Diagnosis not present

## 2017-05-10 DIAGNOSIS — H35033 Hypertensive retinopathy, bilateral: Secondary | ICD-10-CM | POA: Diagnosis not present

## 2017-05-10 DIAGNOSIS — H401131 Primary open-angle glaucoma, bilateral, mild stage: Secondary | ICD-10-CM | POA: Diagnosis not present

## 2017-06-05 DIAGNOSIS — H401111 Primary open-angle glaucoma, right eye, mild stage: Secondary | ICD-10-CM | POA: Diagnosis not present

## 2017-06-19 DIAGNOSIS — H401121 Primary open-angle glaucoma, left eye, mild stage: Secondary | ICD-10-CM | POA: Diagnosis not present

## 2017-07-19 DIAGNOSIS — G43109 Migraine with aura, not intractable, without status migrainosus: Secondary | ICD-10-CM | POA: Diagnosis not present

## 2017-07-19 DIAGNOSIS — H401131 Primary open-angle glaucoma, bilateral, mild stage: Secondary | ICD-10-CM | POA: Diagnosis not present

## 2017-07-19 DIAGNOSIS — H04123 Dry eye syndrome of bilateral lacrimal glands: Secondary | ICD-10-CM | POA: Diagnosis not present

## 2017-07-19 DIAGNOSIS — H40053 Ocular hypertension, bilateral: Secondary | ICD-10-CM | POA: Diagnosis not present

## 2017-08-16 DIAGNOSIS — H401131 Primary open-angle glaucoma, bilateral, mild stage: Secondary | ICD-10-CM | POA: Diagnosis not present

## 2017-08-16 DIAGNOSIS — H40053 Ocular hypertension, bilateral: Secondary | ICD-10-CM | POA: Diagnosis not present

## 2017-08-16 DIAGNOSIS — H04123 Dry eye syndrome of bilateral lacrimal glands: Secondary | ICD-10-CM | POA: Diagnosis not present

## 2017-08-16 DIAGNOSIS — G43109 Migraine with aura, not intractable, without status migrainosus: Secondary | ICD-10-CM | POA: Diagnosis not present

## 2017-10-10 ENCOUNTER — Other Ambulatory Visit: Payer: Self-pay | Admitting: Cardiology

## 2017-10-11 NOTE — Telephone Encounter (Signed)
Scr = 0.86 on 12/2016 per [facility]

## 2017-12-13 DIAGNOSIS — H40053 Ocular hypertension, bilateral: Secondary | ICD-10-CM | POA: Diagnosis not present

## 2017-12-13 DIAGNOSIS — H04123 Dry eye syndrome of bilateral lacrimal glands: Secondary | ICD-10-CM | POA: Diagnosis not present

## 2017-12-13 DIAGNOSIS — G43109 Migraine with aura, not intractable, without status migrainosus: Secondary | ICD-10-CM | POA: Diagnosis not present

## 2017-12-13 DIAGNOSIS — H401131 Primary open-angle glaucoma, bilateral, mild stage: Secondary | ICD-10-CM | POA: Diagnosis not present

## 2018-01-04 ENCOUNTER — Other Ambulatory Visit: Payer: Self-pay | Admitting: Cardiology

## 2018-01-04 NOTE — Telephone Encounter (Signed)
Scr done 12/2016 per [facility]

## 2018-01-10 DIAGNOSIS — G43109 Migraine with aura, not intractable, without status migrainosus: Secondary | ICD-10-CM | POA: Diagnosis not present

## 2018-01-10 DIAGNOSIS — H401131 Primary open-angle glaucoma, bilateral, mild stage: Secondary | ICD-10-CM | POA: Diagnosis not present

## 2018-01-10 DIAGNOSIS — H04123 Dry eye syndrome of bilateral lacrimal glands: Secondary | ICD-10-CM | POA: Diagnosis not present

## 2018-01-10 DIAGNOSIS — H40053 Ocular hypertension, bilateral: Secondary | ICD-10-CM | POA: Diagnosis not present

## 2018-01-11 ENCOUNTER — Telehealth: Payer: Self-pay | Admitting: *Deleted

## 2018-01-11 NOTE — Telephone Encounter (Signed)
Faxed CPAP supply  Order to [employer].

## 2018-01-12 ENCOUNTER — Ambulatory Visit (HOSPITAL_COMMUNITY)
Admission: RE | Admit: 2018-01-12 | Discharge: 2018-01-12 | Disposition: A | Discharge status: Home / Self Care | Payer: Medicare Other | Source: Ambulatory Visit | Attending: Internal Medicine | Admitting: Internal Medicine

## 2018-01-12 DIAGNOSIS — I6523 Occlusion and stenosis of bilateral carotid arteries: Secondary | ICD-10-CM | POA: Diagnosis not present

## 2018-01-16 ENCOUNTER — Telehealth: Payer: Self-pay | Admitting: Cardiology

## 2018-01-16 DIAGNOSIS — I6523 Occlusion and stenosis of bilateral carotid arteries: Secondary | ICD-10-CM

## 2018-01-16 NOTE — Telephone Encounter (Signed)
New Message ° ° °Pt returning call for nurse °

## 2018-01-16 NOTE — Telephone Encounter (Signed)
Patient notified of carotid doppler study Repeat order put in Epic

## 2018-01-19 ENCOUNTER — Telehealth: Payer: Self-pay | Admitting: *Deleted

## 2018-01-19 NOTE — Telephone Encounter (Signed)
Faxed CPAP supply order to [facility] for supplies received 01/05/18.

## 2018-01-24 DIAGNOSIS — Z0000 Encounter for general adult medical examination without abnormal findings: Secondary | ICD-10-CM | POA: Diagnosis not present

## 2018-01-24 DIAGNOSIS — I482 Chronic atrial fibrillation: Secondary | ICD-10-CM | POA: Diagnosis not present

## 2018-01-24 DIAGNOSIS — M109 Gout, unspecified: Secondary | ICD-10-CM | POA: Diagnosis not present

## 2018-01-24 DIAGNOSIS — I6529 Occlusion and stenosis of unspecified carotid artery: Secondary | ICD-10-CM | POA: Diagnosis not present

## 2018-01-24 DIAGNOSIS — I10 Essential (primary) hypertension: Secondary | ICD-10-CM | POA: Diagnosis not present

## 2018-01-24 DIAGNOSIS — Z6841 Body Mass Index (BMI) 40.0 and over, adult: Secondary | ICD-10-CM | POA: Diagnosis not present

## 2018-01-24 DIAGNOSIS — I6990 Unspecified sequelae of unspecified cerebrovascular disease: Secondary | ICD-10-CM | POA: Diagnosis not present

## 2018-01-24 DIAGNOSIS — Z23 Encounter for immunization: Secondary | ICD-10-CM | POA: Diagnosis not present

## 2018-01-24 DIAGNOSIS — Z780 Asymptomatic menopausal state: Secondary | ICD-10-CM | POA: Diagnosis not present

## 2018-01-24 DIAGNOSIS — Z1389 Encounter for screening for other disorder: Secondary | ICD-10-CM | POA: Diagnosis not present

## 2018-01-31 ENCOUNTER — Other Ambulatory Visit: Payer: Self-pay | Admitting: Cardiology

## 2018-02-12 ENCOUNTER — Telehealth: Payer: Self-pay | Admitting: *Deleted

## 2018-02-12 NOTE — Telephone Encounter (Signed)
Faxed CPAP supply order to [facility] 

## 2018-03-02 ENCOUNTER — Other Ambulatory Visit: Payer: Self-pay | Admitting: Cardiology

## 2018-03-02 NOTE — Telephone Encounter (Signed)
Rx sent to pharmacy   

## 2018-03-12 DIAGNOSIS — Z780 Asymptomatic menopausal state: Secondary | ICD-10-CM | POA: Diagnosis not present

## 2018-03-14 DIAGNOSIS — H40052 Ocular hypertension, left eye: Secondary | ICD-10-CM | POA: Diagnosis not present

## 2018-03-14 DIAGNOSIS — G43109 Migraine with aura, not intractable, without status migrainosus: Secondary | ICD-10-CM | POA: Diagnosis not present

## 2018-03-14 DIAGNOSIS — H401131 Primary open-angle glaucoma, bilateral, mild stage: Secondary | ICD-10-CM | POA: Diagnosis not present

## 2018-03-14 DIAGNOSIS — H04123 Dry eye syndrome of bilateral lacrimal glands: Secondary | ICD-10-CM | POA: Diagnosis not present

## 2018-03-24 ENCOUNTER — Other Ambulatory Visit: Payer: Self-pay | Admitting: Cardiology

## 2018-03-27 NOTE — Progress Notes (Signed)
HPI The patient presents for follow up of  atrial fib.   She was going to have cardioversion but actually went back into sinus rhythm on her own.   Later on follow up she was in atrial fib but without symptoms.  Holter demonstrated atrial fib with controlled rate.  We opted for rate control and anticoagulation.  She has also had a positive sleep study.  She is using CPAP.  The patient denies any new symptoms such as chest discomfort, neck or arm discomfort. There has been no new shortness of breath, PND or orthopnea. There have been no reported palpitations, presyncope or syncope.  She is under stress at work.    No Known Allergies  Current Outpatient Medications  Medication Sig Dispense Refill  . allopurinol (ZYLOPRIM) 100 MG tablet Take 100 mg by mouth every evening.    Marland Kitchen atorvastatin (LIPITOR) 40 MG tablet Take 40 mg by mouth every evening.   0  . diltiazem (CARTIA XT) 240 MG 24 hr capsule Take 240 mg by mouth daily.    Marland Kitchen latanoprost (XALATAN) 0.005 % ophthalmic solution Place 1 drop into both eyes at bedtime.    Marland Kitchen losartan (COZAAR) 50 MG tablet take 1 tablet by mouth once daily 90 tablet 1  . rivaroxaban (XARELTO) 20 MG TABS tablet Take 1 tablet (20 mg total) by mouth daily with supper. 90 tablet 0  . SIMBRINZA 1-0.2 % SUSP Take 1 tablet by mouth 2 (two) times daily.     No current facility-administered medications for this visit.     Past Medical History:  Diagnosis Date  . Arthritis    "left knee" (10/09/2012)  . Atrial fibrillation ([place])   . Carotid artery occlusion    a. Carotid US (12/10/13):  R 60-79%; L 1-39% - f/u 6 mos  . Glaucoma (increased eye pressure)    "both eyes" (10/09/2012)  . Gout   . Hx of echocardiogram    a. Echo (09/2012):  Mild LVH, EF 55-65%, Gr 1 DD, MAC, mild MR, mild to mod LAE, mild RVE, PASP 44 mmHg  . Hypertension   . Stroke [facility]) 10/09/2012    Past Surgical History:  Procedure Laterality Date  . BREAST BIOPSY  1980's   "left; it wasn't  anything" (10/09/2012)  . COLONOSCOPY WITH PROPOFOL N/A 12/12/2016   Procedure: COLONOSCOPY WITH PROPOFOL;  Surgeon: Garlan Fair, MD;  Location: [facility];  Service: Endoscopy;  Laterality: N/A;  . KNEE ARTHROSCOPY W/ DEBRIDEMENT  2011   "left; for arthritis" (10/09/2012)    ROS:  As stated in the HPI and negative for all other systems.   PHYSICAL EXAM BP (!) 168/120   Pulse 89   Ht _0  (1.626 m)   Wt 254 lb (115.2 kg)   BMI 43.60 kg/m   GENERAL:  Well appearing NECK:  No jugular venous distention, waveform within normal limits, carotid upstroke brisk and symmetric, no bruits, no thyromegaly LUNGS:  Clear to auscultation bilaterally CHEST:  Unremarkable HEART:  PMI not displaced or sustained,S1 and S2 within normal limits, no S3, no S4, no clicks, no rubs, 2 out of 6 apical systolic murmur heard best at the left border rate, no diastolic1 murmurs ABD:  Flat, positive bowel sounds normal in frequency in pitch, no bruits, no rebound, no guarding, no midline pulsatile mass, no hepatomegaly, no splenomegaly EXT:  2 plus pulses throughout, mild left greater than right edema, no cyanosis no clubbing Atrial fibrillation, rate  EKG: 89, low  voltage, axis within normal limits, intervals within normal limits, nonspecific T wave changes.  03/29/2018   ASSESSMENT AND PLAN  ATRIAL FIB:  [name] has a CHA2DS2 - VASc score of 4 with a risk of stroke of 4.  She will continue meds as listed.  HTN:  Her blood pressure is not controlled.  She is going to keep a BP diary.  I have encouraged weight loss.  Is unusual for her systolics and diastolics to be elevated.   CAROTID STENOSIS:  She had 40 - 59% stenosis on the right and less on the left in March..  No change in therapy.   CARDIOMYOPATHY:   I did repeat her echocardiogram in 2017 andher EF was well-preserved and she has some moderate TR.  No further imaging is indicated.   SLEEP APNEA    The patient has a positive sleep  study.  I conferred with our sleep nurse.  She needs some adjustment to her settings and we will arrange this.   She is compliant with apparatus use.

## 2018-03-28 ENCOUNTER — Encounter: Payer: Self-pay | Admitting: Cardiology

## 2018-03-29 ENCOUNTER — Ambulatory Visit (INDEPENDENT_AMBULATORY_CARE_PROVIDER_SITE_OTHER): Payer: Medicare Other | Admitting: Cardiology

## 2018-03-29 ENCOUNTER — Encounter: Payer: Self-pay | Admitting: Cardiology

## 2018-03-29 VITALS — BP 168/120 | HR 89 | Ht 64.0 in | Wt 254.0 lb

## 2018-03-29 DIAGNOSIS — G4730 Sleep apnea, unspecified: Secondary | ICD-10-CM | POA: Diagnosis not present

## 2018-03-29 DIAGNOSIS — I482 Chronic atrial fibrillation: Secondary | ICD-10-CM

## 2018-03-29 DIAGNOSIS — I6523 Occlusion and stenosis of bilateral carotid arteries: Secondary | ICD-10-CM

## 2018-03-29 DIAGNOSIS — I4821 Permanent atrial fibrillation: Secondary | ICD-10-CM

## 2018-03-29 DIAGNOSIS — I10 Essential (primary) hypertension: Secondary | ICD-10-CM | POA: Diagnosis not present

## 2018-03-29 NOTE — Patient Instructions (Signed)
Medication Instructions:  Continue current medications  If you need a refill on your cardiac medications before your next appointment, please call your pharmacy.  Labwork: None Ordered   Testing/Procedures: None Ordered  Follow-Up: Your physician wants you to follow-up in: 3 Months.     Thank you for choosing [facility]!!       

## 2018-04-12 ENCOUNTER — Telehealth: Payer: Self-pay | Admitting: *Deleted

## 2018-04-12 NOTE — Telephone Encounter (Signed)
Left message on cell VM per Dr. Claiborne Billings based on download she needs to increase her sleep duration. Also her pressure has been increased from 12 to 13 CmH2o. We will download it again in about 1 month. If further changes need to be made she will be contacted. She can call me back if questions or concerns. Direct phone line information left patient to call if needed.

## 2018-04-17 NOTE — Addendum Note (Signed)
Addended by: Therisa Doyne on: 04/17/2018 01:40 PM   Modules accepted: Orders

## 2018-05-16 DIAGNOSIS — I708 Atherosclerosis of other arteries: Secondary | ICD-10-CM | POA: Diagnosis not present

## 2018-05-16 DIAGNOSIS — H25013 Cortical age-related cataract, bilateral: Secondary | ICD-10-CM | POA: Diagnosis not present

## 2018-05-16 DIAGNOSIS — H401131 Primary open-angle glaucoma, bilateral, mild stage: Secondary | ICD-10-CM | POA: Diagnosis not present

## 2018-05-16 DIAGNOSIS — H35033 Hypertensive retinopathy, bilateral: Secondary | ICD-10-CM | POA: Diagnosis not present

## 2018-05-16 DIAGNOSIS — H2513 Age-related nuclear cataract, bilateral: Secondary | ICD-10-CM | POA: Diagnosis not present

## 2018-06-22 ENCOUNTER — Other Ambulatory Visit: Payer: Self-pay | Admitting: Cardiology

## 2018-06-25 NOTE — Telephone Encounter (Signed)
Recent BMET - KPN

## 2018-06-28 ENCOUNTER — Ambulatory Visit: Payer: Medicare Other | Admitting: Cardiology

## 2018-08-14 NOTE — Progress Notes (Addendum)
HPI The patient presents for follow up of  atrial fib.   She was going to have cardioversion but actually went back into sinus rhythm on her own.   Later on follow up she was in atrial fib but without symptoms.  Holter demonstrated atrial fib with controlled rate.  We opted for rate control and anticoagulation.  She has also had a positive sleep study.  She is using CPAP.  She feels better with this and sleeps through the night.    The patient denies any new symptoms such as chest discomfort, neck or arm discomfort. There has been no new shortness of breath, PND or orthopnea. There have been no reported palpitations, presyncope or syncope.  Her blood pressure was elevated recently and she had her Cozaar increased to 100 mg just a couple of days ago.  No Known Allergies  Current Outpatient Medications  Medication Sig Dispense Refill  . allopurinol (ZYLOPRIM) 100 MG tablet Take 100 mg by mouth every evening.    Marland Kitchen atorvastatin (LIPITOR) 40 MG tablet Take 40 mg by mouth every evening.   0  . diltiazem (CARTIA XT) 240 MG 24 hr capsule Take 240 mg by mouth daily.    Marland Kitchen latanoprost (XALATAN) 0.005 % ophthalmic solution Place 1 drop into both eyes at bedtime.    Marland Kitchen losartan (COZAAR) 100 MG tablet Take 1 tablet by mouth daily.    Marland Kitchen SIMBRINZA 1-0.2 % SUSP Take 1 tablet by mouth 2 (two) times daily.    Alveda Reasons 20 MG TABS tablet TAKE 1 TABLET (20 MG TOTAL) BY MOUTH DAILY WITH SUPPER. 90 tablet 1   No current facility-administered medications for this visit.     Past Medical History:  Diagnosis Date  . Arthritis    "left knee" (10/09/2012)  . Atrial fibrillation ([place])   . Carotid artery occlusion    a. Carotid US (12/10/13):  R 60-79%; L 1-39% - f/u 6 mos  . Glaucoma (increased eye pressure)    "both eyes" (10/09/2012)  . Gout   . Hx of echocardiogram    a. Echo (09/2012):  Mild LVH, EF 55-65%, Gr 1 DD, MAC, mild MR, mild to mod LAE, mild RVE, PASP 44 mmHg  . Hypertension   . Stroke [facility])  10/09/2012    Past Surgical History:  Procedure Laterality Date  . BREAST BIOPSY  1980's   "left; it wasn't anything" (10/09/2012)  . COLONOSCOPY WITH PROPOFOL N/A 12/12/2016   Procedure: COLONOSCOPY WITH PROPOFOL;  Surgeon: Garlan Fair, MD;  Location: [facility];  Service: Endoscopy;  Laterality: N/A;  . KNEE ARTHROSCOPY W/ DEBRIDEMENT  2011   "left; for arthritis" (10/09/2012)    ROS:  As stated in the HPI and negative for all other systems.   PHYSICAL EXAM BP (!) 172/114 (BP Location: Right Arm)   Pulse 99   Ht 5' 4.5" (1.638 m)   Wt 239 lb (108.4 kg)   SpO2 100%   BMI 40.39 kg/m   GENERAL:  Well appearing NECK:  No jugular venous distention, waveform within normal limits, carotid upstroke brisk and symmetric, no bruits, no thyromegaly LUNGS:  Clear to auscultation bilaterally CHEST:  Unremarkable HEART:  PMI not displaced or sustained,S1 and S2 within normal limits, no S3, , no clicks, no rubs, no murmurs, irregular ABD:  Flat, positive bowel sounds normal in frequency in pitch, no bruits, no rebound, no guarding, no midline pulsatile mass, no hepatomegaly, no splenomegaly EXT:  2 plus pulses throughout,  no edema, no cyanosis no clubbing   EKG:  Is not done today.   ASSESSMENT AND PLAN  ATRIAL FIB:  Ms. JUDIETH MCKOWN has a CHA2DS2 - VASc score of 4 with a risk of stroke of 4.  She will continue meds as listed.   HTN:  Her blood pressure is not controlled.  She just had her medications changed yesterday.  Her diastolic was 741 every he did it.  I will not change her meds now but would run up to bring her back with both of her blood pressure cuffs (1 for work and one for home) to make sure they work accurately and then she is going to keep a blood pressure diary.  The next step if her blood pressure is not well controlled would be to increase her Cardizem.  We have again talked about salt restriction weight loss.  She has done a good job losing weight.  N  CAROTID  STENOSIS:  She had 40 - 59% stenosis on the right and less on the left in March.  I reviewed this with her.  She will follow-up again in March.   CARDIOMYOPATHY:   I did repeat her echocardiogram in 2017 andher EF was well-preserved and she has some moderate TR. I will follow this clinically.  No change in therapy.   SLEEP APNEA     She is being managed actively for this.   Today she had a download and she had a leak in the mask and an adjustment.  She was seen by our sleep nurse.   She is 100% compliant with CPAP and she benefits from therapy.  DYSLIPIDEMIA:  LDL was 36.  Continue current therapy.

## 2018-08-15 ENCOUNTER — Encounter: Payer: Self-pay | Admitting: Cardiovascular Disease

## 2018-08-15 ENCOUNTER — Other Ambulatory Visit: Payer: Self-pay | Admitting: Internal Medicine

## 2018-08-15 DIAGNOSIS — R1906 Epigastric swelling, mass or lump: Secondary | ICD-10-CM

## 2018-08-15 DIAGNOSIS — I10 Essential (primary) hypertension: Secondary | ICD-10-CM | POA: Diagnosis not present

## 2018-08-15 DIAGNOSIS — Z23 Encounter for immunization: Secondary | ICD-10-CM | POA: Diagnosis not present

## 2018-08-16 ENCOUNTER — Encounter: Payer: Self-pay | Admitting: Cardiology

## 2018-08-16 ENCOUNTER — Ambulatory Visit (INDEPENDENT_AMBULATORY_CARE_PROVIDER_SITE_OTHER): Payer: Medicare Other | Admitting: Cardiology

## 2018-08-16 VITALS — BP 172/114 | HR 99 | Ht 64.5 in | Wt 239.0 lb

## 2018-08-16 DIAGNOSIS — G4733 Obstructive sleep apnea (adult) (pediatric): Secondary | ICD-10-CM | POA: Diagnosis not present

## 2018-08-16 DIAGNOSIS — I6523 Occlusion and stenosis of bilateral carotid arteries: Secondary | ICD-10-CM

## 2018-08-16 DIAGNOSIS — I4821 Permanent atrial fibrillation: Secondary | ICD-10-CM | POA: Diagnosis not present

## 2018-08-16 DIAGNOSIS — E785 Hyperlipidemia, unspecified: Secondary | ICD-10-CM | POA: Diagnosis not present

## 2018-08-16 DIAGNOSIS — I10 Essential (primary) hypertension: Secondary | ICD-10-CM | POA: Diagnosis not present

## 2018-08-16 NOTE — Patient Instructions (Signed)
Medication Instructions:  Continue current medications  If you need a refill on your cardiac medications before your next appointment, please call your pharmacy.  Labwork: None Ordered   If you have labs (blood work) drawn today and your tests are completely normal, you will receive your results only by: Marland Kitchen MyChart Message (if you have MyChart) OR . A paper copy in the mail If you have any lab test that is abnormal or we need to change your treatment, we will call you to review the results.  Testing/Procedures: None Ordered  Special Instructions: Keep a diary of your Blood Pressure for the next 2 weeks  Follow-Up: You will need a follow up appointment in 3 Months.  Please call our office 2 months in [phone]) to schedule the appointment.  You may see  DR Percival Spanish or one of the following Advanced Practice Providers on your designated Care Team:   . Jory Sims, DNP, ANP . Rhonda Barrett, PA-C .  Marland Kitchen Kerin Ransom, PA-C . Daleen Snook Kroeger, PA-C . Sande Rives, PA-C .  Marland Kitchen Almyra Deforest, PA-C . Fabian Sharp, PA-C  At [facility], you and your health needs are our priority.  As part of our continuing mission to provide you with exceptional heart care, we have created designated Provider Care Teams.  These Care Teams include your primary Cardiologist (physician) and Advanced Practice Providers (APPs -  Physician Assistants and Nurse Practitioners) who all work together to provide you with the care you need, when you need it.   Thank you for choosing [facility] at [facility]!!

## 2018-08-21 ENCOUNTER — Ambulatory Visit
Admission: RE | Admit: 2018-08-21 | Discharge: 2018-08-21 | Disposition: A | Discharge status: Home / Self Care | Payer: Medicare Other | Source: Ambulatory Visit | Attending: Internal Medicine | Admitting: Internal Medicine

## 2018-08-21 DIAGNOSIS — N281 Cyst of kidney, acquired: Secondary | ICD-10-CM | POA: Diagnosis not present

## 2018-08-21 DIAGNOSIS — R1906 Epigastric swelling, mass or lump: Secondary | ICD-10-CM

## 2018-08-29 DIAGNOSIS — H401131 Primary open-angle glaucoma, bilateral, mild stage: Secondary | ICD-10-CM | POA: Diagnosis not present

## 2018-08-29 DIAGNOSIS — H04123 Dry eye syndrome of bilateral lacrimal glands: Secondary | ICD-10-CM | POA: Diagnosis not present

## 2018-08-29 DIAGNOSIS — G43109 Migraine with aura, not intractable, without status migrainosus: Secondary | ICD-10-CM | POA: Diagnosis not present

## 2018-08-29 DIAGNOSIS — H02403 Unspecified ptosis of bilateral eyelids: Secondary | ICD-10-CM | POA: Diagnosis not present

## 2018-09-07 DIAGNOSIS — Z803 Family history of malignant neoplasm of breast: Secondary | ICD-10-CM | POA: Diagnosis not present

## 2018-09-07 DIAGNOSIS — Z1231 Encounter for screening mammogram for malignant neoplasm of breast: Secondary | ICD-10-CM | POA: Diagnosis not present

## 2018-09-10 DIAGNOSIS — I10 Essential (primary) hypertension: Secondary | ICD-10-CM | POA: Diagnosis not present

## 2018-09-10 DIAGNOSIS — R188 Other ascites: Secondary | ICD-10-CM | POA: Diagnosis not present

## 2018-09-10 DIAGNOSIS — K7689 Other specified diseases of liver: Secondary | ICD-10-CM | POA: Diagnosis not present

## 2018-09-10 DIAGNOSIS — K7460 Unspecified cirrhosis of liver: Secondary | ICD-10-CM | POA: Diagnosis not present

## 2018-09-11 ENCOUNTER — Other Ambulatory Visit (HOSPITAL_COMMUNITY): Payer: Self-pay | Admitting: Internal Medicine

## 2018-09-11 DIAGNOSIS — R188 Other ascites: Secondary | ICD-10-CM

## 2018-09-12 ENCOUNTER — Other Ambulatory Visit (HOSPITAL_COMMUNITY): Payer: Self-pay | Admitting: Internal Medicine

## 2018-09-12 DIAGNOSIS — R188 Other ascites: Secondary | ICD-10-CM

## 2018-09-13 ENCOUNTER — Other Ambulatory Visit (HOSPITAL_COMMUNITY): Payer: Self-pay | Admitting: Internal Medicine

## 2018-09-13 DIAGNOSIS — R921 Mammographic calcification found on diagnostic imaging of breast: Secondary | ICD-10-CM | POA: Diagnosis not present

## 2018-09-13 DIAGNOSIS — K7031 Alcoholic cirrhosis of liver with ascites: Secondary | ICD-10-CM

## 2018-09-13 DIAGNOSIS — R188 Other ascites: Secondary | ICD-10-CM

## 2018-09-18 ENCOUNTER — Ambulatory Visit (HOSPITAL_COMMUNITY): Admission: RE | Admit: 2018-09-18 | Payer: Medicare Other | Source: Ambulatory Visit

## 2018-09-18 ENCOUNTER — Encounter (HOSPITAL_COMMUNITY): Payer: Self-pay

## 2018-09-20 ENCOUNTER — Telehealth: Payer: Self-pay | Admitting: Cardiology

## 2018-09-20 ENCOUNTER — Other Ambulatory Visit (HOSPITAL_COMMUNITY): Payer: Self-pay | Admitting: Gastroenterology

## 2018-09-20 DIAGNOSIS — R188 Other ascites: Secondary | ICD-10-CM | POA: Diagnosis not present

## 2018-09-20 DIAGNOSIS — K7469 Other cirrhosis of liver: Secondary | ICD-10-CM | POA: Diagnosis not present

## 2018-09-20 NOTE — Telephone Encounter (Signed)
New Message      [facility] Pre-operative Risk Assessment    Request for surgical clearance:  1. What type of surgery is being performed? Paracentesis  2. When is this surgery scheduled? 09/25/18  3. What type of clearance is required (medical clearance vs. Pharmacy clearance to hold med vs. Both)? Both  4. Are there any medications that need to be held prior to surgery and how long? Xarelto 3 days prior  5. Practice name and name of physician performing surgery? [facility]/ Dr. Therisa Doyne  6. What is your office phone number [phone]   7.   What is your office fax number [phone]  8.   Anesthesia type (None, local, MAC, general) ? None   Erin Holt 09/20/2018, 4:33 PM  _________________________________________________________________   (provider comments below)

## 2018-09-21 NOTE — Telephone Encounter (Signed)
LM for pt to call back.

## 2018-09-21 NOTE — Telephone Encounter (Signed)
New Message   Dia from [facility] calling to check on the preop clearance. Please call

## 2018-09-21 NOTE — Telephone Encounter (Signed)
Pt takes Xarelto for afib with CHADS2VASc score of 5 (age, sex, HTN, CAD, CVA). SCr 0.8 in KPN from last month, normal renal function. Recommend only holding Xarelto for 24 hours prior to procedure due to history of CVA. If longer duration off anticoagulation is required, clearance will need to come from Dr Percival Spanish.

## 2018-09-21 NOTE — Telephone Encounter (Signed)
   Primary Cardiologist: Minus Breeding, MD  Chart reviewed as part of pre-operative protocol coverage. Patient was contacted 09/21/2018 in reference to pre-operative risk assessment for pending surgery as outlined below.  Erin Holt was last seen on 08/16/18 by Dr. Percival Spanish.  Since that day, Erin Holt has done well with no new complaints.  Therefore, based on ACC/AHA guidelines, the patient would be at acceptable risk for the planned procedure without further cardiovascular testing.   Anticoagulation:  Pt takes Xarelto for afib with CHADS2VASc score of 5 (age, sex, HTN, CAD, CVA). SCr 0.8 in KPN from last month, normal renal function. Recommend only holding Xarelto for 24 hours prior to procedure due to history of CVA. If longer duration off anticoagulation is required, clearance will need to come from Dr Percival Spanish.   I will route this recommendation to the requesting party via Epic fax function and remove from pre-op pool.  Please call with questions.  Daune Perch, NP 09/21/2018, 12:16 PM

## 2018-09-23 ENCOUNTER — Other Ambulatory Visit: Payer: Self-pay | Admitting: Radiology

## 2018-09-23 DIAGNOSIS — N6032 Fibrosclerosis of left breast: Secondary | ICD-10-CM | POA: Diagnosis not present

## 2018-09-24 DIAGNOSIS — N6032 Fibrosclerosis of left breast: Secondary | ICD-10-CM | POA: Diagnosis not present

## 2018-09-24 DIAGNOSIS — R921 Mammographic calcification found on diagnostic imaging of breast: Secondary | ICD-10-CM | POA: Diagnosis not present

## 2018-09-25 ENCOUNTER — Encounter (HOSPITAL_COMMUNITY): Payer: Self-pay | Admitting: Physician Assistant

## 2018-09-25 ENCOUNTER — Ambulatory Visit (HOSPITAL_COMMUNITY)
Admission: RE | Admit: 2018-09-25 | Discharge: 2018-09-25 | Disposition: A | Discharge status: Home / Self Care | Payer: Medicare Other | Source: Ambulatory Visit | Attending: Gastroenterology | Admitting: Gastroenterology

## 2018-09-25 DIAGNOSIS — R188 Other ascites: Secondary | ICD-10-CM | POA: Insufficient documentation

## 2018-09-25 DIAGNOSIS — R8589 Other abnormal findings in specimens from digestive organs and abdominal cavity: Secondary | ICD-10-CM | POA: Diagnosis not present

## 2018-09-25 DIAGNOSIS — Z7901 Long term (current) use of anticoagulants: Secondary | ICD-10-CM | POA: Diagnosis not present

## 2018-09-25 HISTORY — PX: IR PARACENTESIS: IMG2679

## 2018-09-25 LAB — PROTEIN, PLEURAL OR PERITONEAL FLUID: Total protein, fluid: 4.7 g/dL

## 2018-09-25 LAB — BODY FLUID CELL COUNT WITH DIFFERENTIAL
EOS FL: 0 %
Lymphs, Fluid: 21 %
MONOCYTE-MACROPHAGE-SEROUS FLUID: 73 % (ref 50–90)
Neutrophil Count, Fluid: 6 % (ref 0–25)
Total Nucleated Cell Count, Fluid: 700 cu mm (ref 0–1000)

## 2018-09-25 MED ORDER — LIDOCAINE HCL 1 % IJ SOLN
INTRAMUSCULAR | Status: AC
  Filled 2018-09-25: qty 20

## 2018-09-25 MED ORDER — LIDOCAINE HCL (PF) 1 % IJ SOLN
INTRAMUSCULAR | Status: DC | PRN
  Administered 2018-09-25: 10 mL

## 2018-09-25 NOTE — Procedures (Signed)
PROCEDURE SUMMARY:  Successful image-guided paracentesis from the left lower abdomen.  Yielded 5.0 liters of serosanguineous fluid which was requested maximum. No immediate complications.  Patient tolerated well.   Specimen was sent for labs.  Please see imaging section of Epic for full dictation.  Joaquim Nam PA-C 09/25/2018 10:18 AM

## 2018-10-08 DIAGNOSIS — K5900 Constipation, unspecified: Secondary | ICD-10-CM | POA: Diagnosis not present

## 2018-10-08 DIAGNOSIS — R188 Other ascites: Secondary | ICD-10-CM | POA: Diagnosis not present

## 2018-10-08 DIAGNOSIS — K7460 Unspecified cirrhosis of liver: Secondary | ICD-10-CM | POA: Diagnosis not present

## 2018-10-09 ENCOUNTER — Other Ambulatory Visit: Payer: Self-pay | Admitting: Gastroenterology

## 2018-10-09 DIAGNOSIS — K7460 Unspecified cirrhosis of liver: Secondary | ICD-10-CM

## 2018-10-10 ENCOUNTER — Telehealth: Payer: Self-pay | Admitting: *Deleted

## 2018-10-10 NOTE — Telephone Encounter (Signed)
   [facility] Pre-operative Risk Assessment    Request for surgical clearance:  1. What type of surgery is being performed? paracentesis   2. When is this surgery scheduled? 10/16/18   3. What type of clearance is required (medical clearance vs. Pharmacy clearance to hold med vs. Both)? both  4. Are there any medications that need to be held prior to surgery and how long? Xarelto x 3 days   5. Practice name and name of physician performing surgery? [facility] Dr. Therisa Doyne   6. What is your office phone number [phone]    7.   What is your office fax number [phone]  8.   Anesthesia type (None, local, MAC, general) ?    Erin Holt 10/10/2018, 8:21 AM  _________________________________________________________________   (provider comments below)

## 2018-10-12 NOTE — Telephone Encounter (Signed)
   Primary Cardiologist: Minus Breeding, MD  Chart reviewed as part of pre-operative protocol coverage. Patient was contacted 10/12/2018 in reference to pre-operative risk assessment for pending surgery as outlined below.  Erin Holt was last seen on 08/16/18  by Dr. Percival Spanish.  Since that day, Erin Holt has done well without chest pain or sob.   Therefore, based on ACC/AHA guidelines, the patient would be at acceptable risk for the planned procedure without further cardiovascular testing.   Anticoagulation:  Pt takes Xarelto for afib with CHADS2VASc score of 5 (age, sex, HTN, CAD, CVA). SCr 0.8 in KPN from last month, normal renal function. Recommend only holding Xarelto for 24 hours prior to procedure due to history of CVA. If longer duration off anticoagulation is required, clearance will need to come from Dr Percival Spanish.   I will route this recommendation to the requesting party via Epic fax function and remove from pre-op pool.  Please call with questions.  Cecilie Kicks, NP 10/12/2018, 10:38 AM

## 2018-10-15 ENCOUNTER — Ambulatory Visit (HOSPITAL_COMMUNITY): Payer: Medicare Other

## 2018-10-15 DIAGNOSIS — R634 Abnormal weight loss: Secondary | ICD-10-CM | POA: Diagnosis not present

## 2018-10-15 DIAGNOSIS — K7460 Unspecified cirrhosis of liver: Secondary | ICD-10-CM | POA: Diagnosis not present

## 2018-10-15 DIAGNOSIS — I10 Essential (primary) hypertension: Secondary | ICD-10-CM | POA: Diagnosis not present

## 2018-10-15 DIAGNOSIS — R188 Other ascites: Secondary | ICD-10-CM | POA: Diagnosis not present

## 2018-10-15 DIAGNOSIS — R928 Other abnormal and inconclusive findings on diagnostic imaging of breast: Secondary | ICD-10-CM | POA: Diagnosis not present

## 2018-10-16 ENCOUNTER — Ambulatory Visit (HOSPITAL_COMMUNITY)
Admission: RE | Admit: 2018-10-16 | Discharge: 2018-10-16 | Disposition: A | Discharge status: Home / Self Care | Payer: Medicare Other | Source: Ambulatory Visit | Attending: Internal Medicine | Admitting: Internal Medicine

## 2018-10-16 ENCOUNTER — Encounter (HOSPITAL_COMMUNITY): Payer: Self-pay | Admitting: Physician Assistant

## 2018-10-16 DIAGNOSIS — R188 Other ascites: Secondary | ICD-10-CM | POA: Diagnosis not present

## 2018-10-16 HISTORY — PX: IR PARACENTESIS: IMG2679

## 2018-10-16 LAB — ALBUMIN, PLEURAL OR PERITONEAL FLUID: Albumin, Fluid: 2.5 g/dL

## 2018-10-16 MED ORDER — LIDOCAINE HCL (PF) 1 % IJ SOLN
INTRAMUSCULAR | Status: AC | PRN
  Administered 2018-10-16: 10 mL

## 2018-10-16 MED ORDER — LIDOCAINE HCL 1 % IJ SOLN
INTRAMUSCULAR | Status: AC
  Filled 2018-10-16: qty 20

## 2018-10-16 NOTE — Procedures (Signed)
PROCEDURE SUMMARY:  Successful US guided paracentesis from left lateral abdomen.  Yielded 3.2 L of clear red tinged fluid.  No immediate complications.  Patient tolerated well.  EBL = trace  Specimen was sent for labs.  Chera Slivka S Gavan Nordby PA-C 10/16/2018 11:13 AM

## 2018-10-17 ENCOUNTER — Ambulatory Visit
Admission: RE | Admit: 2018-10-17 | Discharge: 2018-10-17 | Disposition: A | Discharge status: Home / Self Care | Payer: Medicare Other | Source: Ambulatory Visit | Attending: Gastroenterology | Admitting: Gastroenterology

## 2018-10-17 DIAGNOSIS — K7460 Unspecified cirrhosis of liver: Secondary | ICD-10-CM

## 2018-10-17 DIAGNOSIS — R188 Other ascites: Secondary | ICD-10-CM | POA: Diagnosis not present

## 2018-10-17 MED ORDER — IOPAMIDOL (ISOVUE-300) INJECTION 61%
125.0000 mL | Freq: Once | INTRAVENOUS | Status: AC | PRN
  Administered 2018-10-17: 125 mL via INTRAVENOUS

## 2018-10-25 DIAGNOSIS — R188 Other ascites: Secondary | ICD-10-CM | POA: Diagnosis not present

## 2018-10-25 DIAGNOSIS — K7460 Unspecified cirrhosis of liver: Secondary | ICD-10-CM | POA: Diagnosis not present

## 2018-11-12 ENCOUNTER — Telehealth: Payer: Self-pay | Admitting: Cardiology

## 2018-11-12 NOTE — Telephone Encounter (Signed)
Returned call to patient of Dr. Percival Spanish.   She has been seeing a GI MD Dr. Therisa Doyne. This doctor wants Dr. Percival Spanish to review her records b/c Dr. Therisa Doyne told her that her heart is not working "at 100%". She has had 2 paracentesis and a CT scan (in Epic). Dr. Therisa Doyne is with [facility]. Asked that patient have this provider fax over records in advance on 11/14/18 office visit  Routed to MD/CMA as Juluis Rainier

## 2018-11-12 NOTE — Telephone Encounter (Signed)
New Message:   Patient would like for some one to call her concerning some information that she needs to tell the nurse or doctor.please call patient

## 2018-11-12 NOTE — Progress Notes (Signed)
HPI The patient presents for follow up of  atrial fib.   She was going to have cardioversion but actually went back into sinus rhythm on her own.   Later on follow up she was in atrial fib but without symptoms.  Holter demonstrated atrial fib with controlled rate.  We opted for rate control and anticoagulation.  She has also had a positive sleep study.  She is using CPAP.  She feels better with this and sleeps through the night.  Since I last saw her she had to have a paracentesis.   She was told by a doctor recently that her heart was not working at 100%.  Her EF on echo in 2017 was 55%.   However, she has had known TR and moderate pulmonary HTN.   She did have evidence of increased RA pressure of 8 but preserved RV systolic function.    I reviewed her October notes that she had been doing well and states she was doing well at that point.  Her weights have been stable for years.  However, she says she started getting a bandlike discomfort around her waist.  It had very significant.  She is now been treated with diuretics as below and she has lost 55 pounds.  She actually feels very well.  No shortness of breath, PND or orthopnea although she said these were not significant complaints before.  She did have some lower extremity swelling which improved.  Is not describing any chest pressure, neck or arm discomfort.  She does not notice her atrial fibrillation and has no presyncope or syncope.  No Known Allergies  Current Outpatient Medications  Medication Sig Dispense Refill  . allopurinol (ZYLOPRIM) 100 MG tablet Take 100 mg by mouth every evening.    Marland Kitchen atorvastatin (LIPITOR) 40 MG tablet Take 40 mg by mouth every evening.   0  . diltiazem (CARTIA XT) 240 MG 24 hr capsule Take 240 mg by mouth daily.    Marland Kitchen latanoprost (XALATAN) 0.005 % ophthalmic solution Place 1 drop into both eyes at bedtime.    Marland Kitchen losartan (COZAAR) 100 MG tablet Take 1 tablet by mouth daily.    Marland Kitchen SIMBRINZA 1-0.2 % SUSP Take 1  tablet by mouth 2 (two) times daily.    Marland Kitchen spironolactone (ALDACTONE) 100 MG tablet Take 100 mg by mouth daily.    Alveda Reasons 20 MG TABS tablet TAKE 1 TABLET (20 MG TOTAL) BY MOUTH DAILY WITH SUPPER. 90 tablet 1   No current facility-administered medications for this visit.     Past Medical History:  Diagnosis Date  . Arthritis    "left knee" (10/09/2012)  . Atrial fibrillation ([place])   . Carotid artery occlusion    a. Carotid US (12/10/13):  R 60-79%; L 1-39% - f/u 6 mos  . Glaucoma (increased eye pressure)    "both eyes" (10/09/2012)  . Gout   . Hx of echocardiogram    a. Echo (09/2012):  Mild LVH, EF 55-65%, Gr 1 DD, MAC, mild MR, mild to mod LAE, mild RVE, PASP 44 mmHg  . Hypertension   . Stroke [facility]) 10/09/2012    Past Surgical History:  Procedure Laterality Date  . BREAST BIOPSY  1980's   "left; it wasn't anything" (10/09/2012)  . COLONOSCOPY WITH PROPOFOL N/A 12/12/2016   Procedure: COLONOSCOPY WITH PROPOFOL;  Surgeon: Garlan Fair, MD;  Location: [facility];  Service: Endoscopy;  Laterality: N/A;  . IR PARACENTESIS  09/25/2018  . IR  PARACENTESIS  10/16/2018  . KNEE ARTHROSCOPY W/ DEBRIDEMENT  2011   "left; for arthritis" (10/09/2012)    ROS:  As stated in the HPI and negative for all other systems.   PHYSICAL EXAM BP 118/70   Pulse 74   Ht 5' 4.5" (1.638 m)   Wt 186 lb 9.6 oz (84.6 kg)   SpO2 99%   BMI 31.54 kg/m   GENERAL:  Well appearing NECK:  No jugular venous distention, waveform within normal limits, carotid upstroke brisk and symmetric, no bruits, no thyromegaly LUNGS:  Clear to auscultation bilaterally CHEST:  Unremarkable HEART:  PMI not displaced or sustained,S1 and S2 within normal limits, no S3, no S4, no clicks, no rubs, soft systolic murmur heard anteriorly mid left sternal border, no diastolic murmurs ABD:  Flat, positive bowel sounds normal in frequency in pitch, no bruits, no rebound, no guarding, no midline pulsatile mass, no hepatomegaly,  no splenomegaly EXT:  2 plus pulses throughout, no edema, no cyanosis no clubbing   EKG:  NA   ASSESSMENT AND PLAN  ATRIAL FIB:  Erin Holt has a CHA2DS2 - VASc score of 4 with a risk of stroke of 4.  She tolerates anticoagulation will continue.  No change in therapy.   ASCITES:  This could be related to increased pulmonary pressure.  This will need to be assessed with repeat echo and possible right heart cath.     HTN:  Her blood pressure is well controlled.  She will continue the meds as listed.  CAROTID STENOSIS:  She had 40 - 59% stenosis on the right and less on the left in March 2019.  She will need follow up of this.  I will repeat a carotid Doppler next year.  CARDIOMYOPATHY:   I did repeat her echocardiogram in 2017 and her EF was well-preserved and she has some moderate TR. this will be assessed as above with an echo.  SLEEP APNEA   SHe is having this actively managed.   DYSLIPIDEMIA:  LDL was 36.  She will continue current therapy.

## 2018-11-14 ENCOUNTER — Encounter: Payer: Self-pay | Admitting: Cardiology

## 2018-11-14 ENCOUNTER — Ambulatory Visit (INDEPENDENT_AMBULATORY_CARE_PROVIDER_SITE_OTHER): Payer: Medicare Other | Admitting: Cardiology

## 2018-11-14 VITALS — BP 118/70 | HR 74 | Ht 64.5 in | Wt 186.6 lb

## 2018-11-14 DIAGNOSIS — I10 Essential (primary) hypertension: Secondary | ICD-10-CM

## 2018-11-14 DIAGNOSIS — R188 Other ascites: Secondary | ICD-10-CM

## 2018-11-14 DIAGNOSIS — I272 Pulmonary hypertension, unspecified: Secondary | ICD-10-CM | POA: Diagnosis not present

## 2018-11-14 DIAGNOSIS — I6529 Occlusion and stenosis of unspecified carotid artery: Secondary | ICD-10-CM | POA: Diagnosis not present

## 2018-11-14 DIAGNOSIS — I482 Chronic atrial fibrillation, unspecified: Secondary | ICD-10-CM | POA: Diagnosis not present

## 2018-11-14 NOTE — Patient Instructions (Signed)
Medication Instructions:  Continue current medications  If you need a refill on your cardiac medications before your next appointment, please call your pharmacy.  Labwork: None ordered  Take the provided lab slips with you to the lab for your blood draw.  When you have your labs (blood work) drawn today and your tests are completely normal, you will receive your results only by MyChart Message (if you have MyChart) -OR-  A paper copy in the mail.  If you have any lab test that is abnormal or we need to change your treatment, we will call you to review these results.  Testing/Procedures: Your physician has requested that you have an echocardiogram. Echocardiography is a painless test that uses sound waves to create images of your heart. It provides your doctor with information about the size and shape of your heart and how well your heart's chambers and valves are working. This procedure takes approximately one hour. There are no restrictions for this procedure.   Follow-Up: You will need a follow up appointment in 3 months.  Please call our office 2 months in advance to schedule this appointment.  You may see Minus Breeding, MD or one of the following Advanced Practice Providers on your designated Care Team:   Rosaria Ferries, PA-C . Jory Sims, DNP, ANP   At [facility], you and your health needs are our priority.  As part of our continuing mission to provide you with exceptional heart care, we have created designated Provider Care Teams.  These Care Teams include your primary Cardiologist (physician) and Advanced Practice Providers (APPs -  Physician Assistants and Nurse Practitioners) who all work together to provide you with the care you need, when you need it.  Thank you for choosing [facility] at [facility]!!

## 2018-11-22 ENCOUNTER — Ambulatory Visit (HOSPITAL_COMMUNITY): Payer: Medicare Other | Attending: Cardiovascular Disease

## 2018-11-22 ENCOUNTER — Other Ambulatory Visit: Payer: Self-pay

## 2018-11-22 DIAGNOSIS — I272 Pulmonary hypertension, unspecified: Secondary | ICD-10-CM | POA: Diagnosis not present

## 2018-11-29 ENCOUNTER — Inpatient Hospital Stay (HOSPITAL_COMMUNITY)
Admission: EM | Admit: 2018-11-29 | Discharge: 2018-12-03 | DRG: 683 | Disposition: A | Discharge status: Home / Self Care | Payer: Medicare Other | Attending: Internal Medicine | Admitting: Internal Medicine

## 2018-11-29 ENCOUNTER — Encounter (HOSPITAL_COMMUNITY): Payer: Self-pay | Admitting: Emergency Medicine

## 2018-11-29 ENCOUNTER — Observation Stay (HOSPITAL_COMMUNITY): Payer: Medicare Other

## 2018-11-29 DIAGNOSIS — Z833 Family history of diabetes mellitus: Secondary | ICD-10-CM

## 2018-11-29 DIAGNOSIS — I10 Essential (primary) hypertension: Secondary | ICD-10-CM | POA: Diagnosis present

## 2018-11-29 DIAGNOSIS — E875 Hyperkalemia: Secondary | ICD-10-CM

## 2018-11-29 DIAGNOSIS — N179 Acute kidney failure, unspecified: Secondary | ICD-10-CM | POA: Diagnosis not present

## 2018-11-29 DIAGNOSIS — Z8249 Family history of ischemic heart disease and other diseases of the circulatory system: Secondary | ICD-10-CM

## 2018-11-29 DIAGNOSIS — R402362 Coma scale, best motor response, obeys commands, at arrival to emergency department: Secondary | ICD-10-CM | POA: Diagnosis present

## 2018-11-29 DIAGNOSIS — I272 Pulmonary hypertension, unspecified: Secondary | ICD-10-CM | POA: Diagnosis not present

## 2018-11-29 DIAGNOSIS — G4733 Obstructive sleep apnea (adult) (pediatric): Secondary | ICD-10-CM | POA: Diagnosis present

## 2018-11-29 DIAGNOSIS — T500X5A Adverse effect of mineralocorticoids and their antagonists, initial encounter: Secondary | ICD-10-CM | POA: Diagnosis not present

## 2018-11-29 DIAGNOSIS — M1712 Unilateral primary osteoarthritis, left knee: Secondary | ICD-10-CM | POA: Diagnosis present

## 2018-11-29 DIAGNOSIS — H409 Unspecified glaucoma: Secondary | ICD-10-CM | POA: Diagnosis present

## 2018-11-29 DIAGNOSIS — I4821 Permanent atrial fibrillation: Secondary | ICD-10-CM | POA: Diagnosis not present

## 2018-11-29 DIAGNOSIS — R402142 Coma scale, eyes open, spontaneous, at arrival to emergency department: Secondary | ICD-10-CM | POA: Diagnosis present

## 2018-11-29 DIAGNOSIS — R7989 Other specified abnormal findings of blood chemistry: Secondary | ICD-10-CM | POA: Diagnosis not present

## 2018-11-29 DIAGNOSIS — M109 Gout, unspecified: Secondary | ICD-10-CM | POA: Diagnosis present

## 2018-11-29 DIAGNOSIS — E785 Hyperlipidemia, unspecified: Secondary | ICD-10-CM | POA: Diagnosis present

## 2018-11-29 DIAGNOSIS — I4891 Unspecified atrial fibrillation: Secondary | ICD-10-CM | POA: Diagnosis not present

## 2018-11-29 DIAGNOSIS — K7460 Unspecified cirrhosis of liver: Secondary | ICD-10-CM | POA: Diagnosis not present

## 2018-11-29 DIAGNOSIS — Z8673 Personal history of transient ischemic attack (TIA), and cerebral infarction without residual deficits: Secondary | ICD-10-CM

## 2018-11-29 DIAGNOSIS — Z79899 Other long term (current) drug therapy: Secondary | ICD-10-CM

## 2018-11-29 DIAGNOSIS — R402252 Coma scale, best verbal response, oriented, at arrival to emergency department: Secondary | ICD-10-CM | POA: Diagnosis present

## 2018-11-29 DIAGNOSIS — T464X5A Adverse effect of angiotensin-converting-enzyme inhibitors, initial encounter: Secondary | ICD-10-CM | POA: Diagnosis present

## 2018-11-29 DIAGNOSIS — Z7901 Long term (current) use of anticoagulants: Secondary | ICD-10-CM

## 2018-11-29 LAB — COMPREHENSIVE METABOLIC PANEL
ALT: 22 U/L (ref 0–44)
AST: 25 U/L (ref 15–41)
Albumin: 4.2 g/dL (ref 3.5–5.0)
Alkaline Phosphatase: 143 U/L — ABNORMAL HIGH (ref 38–126)
Anion gap: 13 (ref 5–15)
BILIRUBIN TOTAL: 0.9 mg/dL (ref 0.3–1.2)
BUN: 108 mg/dL — ABNORMAL HIGH (ref 8–23)
CALCIUM: 10.1 mg/dL (ref 8.9–10.3)
CO2: 21 mmol/L — ABNORMAL LOW (ref 22–32)
Chloride: 100 mmol/L (ref 98–111)
Creatinine, Ser: 2.87 mg/dL — ABNORMAL HIGH (ref 0.44–1.00)
GFR calc Af Amer: 19 mL/min — ABNORMAL LOW (ref >60)
GFR, EST NON AFRICAN AMERICAN: 16 mL/min — AB (ref >60)
Glucose, Bld: 122 mg/dL — ABNORMAL HIGH (ref 70–99)
Potassium: 5.9 mmol/L — ABNORMAL HIGH (ref 3.5–5.1)
Sodium: 134 mmol/L — ABNORMAL LOW (ref 135–145)
TOTAL PROTEIN: 8.8 g/dL — AB (ref 6.5–8.1)

## 2018-11-29 LAB — CBC WITH DIFFERENTIAL/PLATELET
Abs Immature Granulocytes: 0.02 10*3/uL (ref 0.00–0.07)
Basophils Absolute: 0.1 10*3/uL (ref 0.0–0.1)
Basophils Relative: 1 %
Eosinophils Absolute: 0.1 10*3/uL (ref 0.0–0.5)
Eosinophils Relative: 1 %
HEMATOCRIT: 46.4 % — AB (ref 36.0–46.0)
Hemoglobin: 14.4 g/dL (ref 12.0–15.0)
Immature Granulocytes: 0 %
Lymphocytes Relative: 36 %
Lymphs Abs: 3.2 10*3/uL (ref 0.7–4.0)
MCH: 27.1 pg (ref 26.0–34.0)
MCHC: 31 g/dL (ref 30.0–36.0)
MCV: 87.4 fL (ref 80.0–100.0)
MONO ABS: 0.6 10*3/uL (ref 0.1–1.0)
Monocytes Relative: 7 %
Neutro Abs: 5 10*3/uL (ref 1.7–7.7)
Neutrophils Relative %: 55 %
Platelets: 222 10*3/uL (ref 150–400)
RBC: 5.31 MIL/uL — ABNORMAL HIGH (ref 3.87–5.11)
RDW: 16.1 % — ABNORMAL HIGH (ref 11.5–15.5)
WBC: 8.9 10*3/uL (ref 4.0–10.5)
nRBC: 0 % (ref 0.0–0.2)

## 2018-11-29 LAB — MAGNESIUM: Magnesium: 2 mg/dL (ref 1.7–2.4)

## 2018-11-29 LAB — URINALYSIS, ROUTINE W REFLEX MICROSCOPIC
Bilirubin Urine: NEGATIVE
Glucose, UA: NEGATIVE mg/dL
Hgb urine dipstick: NEGATIVE
Ketones, ur: NEGATIVE mg/dL
Leukocytes, UA: NEGATIVE
Nitrite: NEGATIVE
Protein, ur: NEGATIVE mg/dL
Specific Gravity, Urine: 1.012 (ref 1.005–1.030)
pH: 5 (ref 5.0–8.0)

## 2018-11-29 LAB — PHOSPHORUS: Phosphorus: 4.8 mg/dL — ABNORMAL HIGH (ref 2.5–4.6)

## 2018-11-29 MED ORDER — LATANOPROST 0.005 % OP SOLN
1.0000 [drp] | Freq: Every day | OPHTHALMIC | Status: DC
  Administered 2018-11-30 – 2018-12-02 (×4): 1 [drp] via OPHTHALMIC
  Filled 2018-11-29: qty 2.5

## 2018-11-29 MED ORDER — SODIUM CHLORIDE 0.9 % IV SOLN: 1.0000 g | Freq: Once | INTRAVENOUS | Status: DC

## 2018-11-29 MED ORDER — ALLOPURINOL 100 MG PO TABS
100.0000 mg | ORAL_TABLET | Freq: Every evening | ORAL | Status: DC
  Administered 2018-11-30 – 2018-12-02 (×3): 100 mg via ORAL
  Filled 2018-11-29 (×3): qty 1

## 2018-11-29 MED ORDER — INSULIN ASPART 100 UNIT/ML ~~LOC~~ SOLN
10.0000 [IU] | Freq: Once | SUBCUTANEOUS | Status: AC
  Administered 2018-11-30: 10 [IU] via SUBCUTANEOUS

## 2018-11-29 MED ORDER — CALCIUM GLUCONATE-NACL 1-0.675 GM/50ML-% IV SOLN
1.0000 g | Freq: Once | INTRAVENOUS | Status: AC
  Administered 2018-11-29: 1000 mg via INTRAVENOUS
  Filled 2018-11-29: qty 50

## 2018-11-29 MED ORDER — ATORVASTATIN CALCIUM 40 MG PO TABS
40.0000 mg | ORAL_TABLET | Freq: Every evening | ORAL | Status: DC
  Administered 2018-11-30 – 2018-12-02 (×3): 40 mg via ORAL
  Filled 2018-11-29 (×3): qty 1

## 2018-11-29 MED ORDER — DEXTROSE 50 % IV SOLN
1.0000 | Freq: Once | INTRAVENOUS | Status: AC
  Administered 2018-11-30: 50 mL via INTRAVENOUS
  Filled 2018-11-29: qty 50

## 2018-11-29 MED ORDER — DILTIAZEM HCL ER COATED BEADS 120 MG PO CP24
240.0000 mg | ORAL_CAPSULE | Freq: Every day | ORAL | Status: DC
  Administered 2018-11-30 – 2018-12-02 (×3): 240 mg via ORAL
  Filled 2018-11-29 (×4): qty 2

## 2018-11-29 MED ORDER — SODIUM CHLORIDE 0.9 % IV SOLN
INTRAVENOUS | Status: DC
  Administered 2018-11-30: 03:00:00 via INTRAVENOUS

## 2018-11-29 NOTE — ED Notes (Signed)
ED TO INPATIENT HANDOFF REPORT  Name/Age/Gender Erin Holt 68 y.o. female  Code Status   Home/SNF/Other Home  Chief Complaint potassium levels high  Level of Care/Admitting Diagnosis ED Disposition    ED Disposition Condition Comment   Admit  The patient appears reasonably stabilized for admission considering the current resources, flow, and capabilities available in the ED at this time, and I doubt any other [facility] requiring further screening and/or treatment in the ED prior to admission is  present.       Medical History Past Medical History:  Diagnosis Date  . Arthritis    "left knee" (10/09/2012)  . Atrial fibrillation ([place])   . Carotid artery occlusion    a. Carotid US (12/10/13):  R 60-79%; L 1-39% - f/u 6 mos  . Glaucoma (increased eye pressure)    "both eyes" (10/09/2012)  . Gout   . Hx of echocardiogram    a. Echo (09/2012):  Mild LVH, EF 55-65%, Gr 1 DD, MAC, mild MR, mild to mod LAE, mild RVE, PASP 44 mmHg  . Hypertension   . Stroke ([place]) 10/09/2012    Allergies No Known Allergies  IV Location/Drains/Wounds Patient Lines/Drains/Airways Status   Active Line/Drains/Airways    None          Labs/Imaging Results for orders placed or performed during the hospital encounter of 11/29/18 (from the past 48 hour(s))  CBC with Differential     Status: Abnormal   Collection Time: 11/29/18  7:52 PM  Result Value Ref Range   WBC 8.9 4.0 - 10.5 K/uL   RBC 5.31 (H) 3.87 - 5.11 MIL/uL   Hemoglobin 14.4 12.0 - 15.0 g/dL   HCT 46.4 (H) 36.0 - 46.0 %   MCV 87.4 80.0 - 100.0 fL   MCH 27.1 26.0 - 34.0 pg   MCHC 31.0 30.0 - 36.0 g/dL   RDW 16.1 (H) 11.5 - 15.5 %   Platelets 222 150 - 400 K/uL   nRBC 0.0 0.0 - 0.2 %   Neutrophils Relative % 55 %   Neutro Abs 5.0 1.7 - 7.7 K/uL   Lymphocytes Relative 36 %   Lymphs Abs 3.2 0.7 - 4.0 K/uL   Monocytes Relative 7 %   Monocytes Absolute 0.6 0.1 - 1.0 K/uL   Eosinophils Relative 1 %   Eosinophils Absolute 0.1  0.0 - 0.5 K/uL   Basophils Relative 1 %   Basophils Absolute 0.1 0.0 - 0.1 K/uL   Immature Granulocytes 0 %   Abs Immature Granulocytes 0.02 0.00 - 0.07 K/uL    Comment: Performed at [facility], [address], [place], [place] [address]  Comprehensive metabolic panel     Status: Abnormal   Collection Time: 11/29/18  7:52 PM  Result Value Ref Range   Sodium 134 (L) 135 - 145 mmol/L   Potassium 5.9 (H) 3.5 - 5.1 mmol/L   Chloride 100 98 - 111 mmol/L   CO2 21 (L) 22 - 32 mmol/L   Glucose, Bld 122 (H) 70 - 99 mg/dL   BUN 108 (H) 8 - 23 mg/dL   Creatinine, Ser 2.87 (H) 0.44 - 1.00 mg/dL   Calcium 10.1 8.9 - 10.3 mg/dL   Total Protein 8.8 (H) 6.5 - 8.1 g/dL   Albumin 4.2 3.5 - 5.0 g/dL   AST 25 15 - 41 U/L   ALT 22 0 - 44 U/L   Alkaline Phosphatase 143 (H) 38 - 126 U/L   Total Bilirubin 0.9 0.3 -  1.2 mg/dL   GFR calc non Af Amer 16 (L) >60 mL/min   GFR calc Af Amer 19 (L) >60 mL/min   Anion gap 13 5 - 15    Comment: Performed at [facility] [address] [place], [place] [address]  Magnesium     Status: None   Collection Time: 11/29/18 10:55 PM  Result Value Ref Range   Magnesium 2.0 1.7 - 2.4 mg/dL    Comment: Performed at [facility], [place] [address], [place], [place] [address]  Phosphorus     Status: Abnormal   Collection Time: 11/29/18 10:55 PM  Result Value Ref Range   Phosphorus 4.8 (H) 2.5 - 4.6 mg/dL    Comment: Performed at [facility] [address] [place], [place] [address]   No results found. EKG Interpretation  Date/Time:  Thursday November 29 2018 20:09:51 EST Ventricular Rate:  76 PR Interval:    QRS Duration: 84 QT Interval:  374 QTC Calculation: 420 R Axis:   63 Text Interpretation:  Atrial fibrillation Low voltage QRS Cannot rule out Anterior infarct , age undetermined Abnormal ECG No acute changes afib is not new Confirmed by Varney Biles [phone]) on 11/29/2018 10:59:12 PM   Pending Labs Unresulted Labs (From  admission, onward)    Start     Ordered   11/29/18 1954  Urinalysis, Routine w reflex microscopic  Once,   R     11/29/18 1954          Vitals/Pain Today's Vitals   11/29/18 1950 11/29/18 2005 11/29/18 2230  BP:  114/71 116/65  Pulse:  84 68  Resp:  18 20  Temp:  97.9 F (36.6 C)   TempSrc:  Oral   SpO2:  100% 100%  PainSc: 0-No pain      Isolation Precautions No active isolations  Medications Medications  calcium gluconate 1 g/ 50 mL sodium chloride IVPB (has no administration in time range)    Mobility walks

## 2018-11-29 NOTE — ED Notes (Signed)
Patient transported to us 

## 2018-11-29 NOTE — H&P (Signed)
History and Physical    Erin Holt TGP:[id number] DOB: 10-May-1951 DOA: 11/29/2018  PCP: Leeroy Cha, MD  Chief Complaint: Abnormal labs  HPI: Erin Holt is a 68 y.o. female with medical history significant of A. fib, parotid stenosis, hypertension, CVA being sent to the hospital by GI for evaluation of hyperkalemia and AKI.  Patient states she went to her GI doctor today and was told her potassium was above 6 and that her kidney function was abnormal.  States she was diagnosed with liver cirrhosis in December 2019 and has had 2 paracentesis procedures done.  She was started on spironolactone a month ago.  Reports having decreased urination for the past 3 days.  Her appetite is okay and she has been consuming plenty of fluids.  Denies having any vomiting or diarrhea.  Denies having any chest pain or palpitations.  No other complaints.  Review of Systems: As per HPI otherwise 10 point review of systems negative.  Past Medical History:  Diagnosis Date  . Arthritis    "left knee" (10/09/2012)  . Atrial fibrillation ([place])   . Carotid artery occlusion    a. Carotid US (12/10/13):  R 60-79%; L 1-39% - f/u 6 mos  . Glaucoma (increased eye pressure)    "both eyes" (10/09/2012)  . Gout   . Hx of echocardiogram    a. Echo (09/2012):  Mild LVH, EF 55-65%, Gr 1 DD, MAC, mild MR, mild to mod LAE, mild RVE, PASP 44 mmHg  . Hypertension   . Stroke [facility]) 10/09/2012    Past Surgical History:  Procedure Laterality Date  . BREAST BIOPSY  1980's   "left; it wasn't anything" (10/09/2012)  . COLONOSCOPY WITH PROPOFOL N/A 12/12/2016   Procedure: COLONOSCOPY WITH PROPOFOL;  Surgeon: Garlan Fair, MD;  Location: [facility] ENDOSCOPY;  Service: Endoscopy;  Laterality: N/A;  . IR PARACENTESIS  09/25/2018  . IR PARACENTESIS  10/16/2018  . KNEE ARTHROSCOPY W/ DEBRIDEMENT  2011   "left; for arthritis" (10/09/2012)     reports that she has never smoked. She has never used smokeless tobacco.  She reports that she does not drink alcohol or use drugs.  No Known Allergies  Family History  Problem Relation Age of Onset  . Heart failure Mother 77       Died  . Diabetes Mother   . Diabetes Father   . CAD Father 64    Prior to Admission medications   Medication Sig Start Date End Date Taking? Authorizing Provider  allopurinol (ZYLOPRIM) 100 MG tablet Take 100 mg by mouth every evening.   Yes [provider]  atorvastatin (LIPITOR) 40 MG tablet Take 40 mg by mouth every evening.  05/06/15  Yes [provider]  diltiazem (CARTIA XT) 240 MG 24 hr capsule Take 240 mg by mouth daily.   Yes [provider]  furosemide (LASIX) 40 MG tablet Take 40 mg by mouth daily. 10/25/18  Yes [provider]  lactulose (CHRONULAC) 10 GM/15ML solution Take 50 g by mouth daily as needed for mild constipation.  11/06/18  Yes [provider]  latanoprost (XALATAN) 0.005 % ophthalmic solution Place 1 drop into both eyes at bedtime.   Yes [provider]  losartan (COZAAR) 100 MG tablet Take 100 mg by mouth daily.  08/15/18  Yes [provider]  PRESCRIPTION MEDICATION CPAP: At bedtime   Yes [provider]  SIMBRINZA 1-0.2 % SUSP Place 1 drop into both eyes 2 (two) times daily.  03/28/18  Yes [provider]  spironolactone (ALDACTONE) 100 MG tablet Take 100 mg by mouth daily.   Yes [provider]  XARELTO 20 MG TABS tablet TAKE 1 TABLET (20 MG TOTAL) BY MOUTH DAILY WITH SUPPER. Patient taking differently: Take 20 mg by mouth daily with supper.  06/25/18  Yes Minus Breeding, MD    Physical Exam: Vitals:   11/29/18 2005 11/29/18 2230  BP: 114/71 116/65  Pulse: 84 68  Resp: 18 20  Temp: 97.9 F (36.6 C)   TempSrc: Oral   SpO2: 100% 100%    Physical Exam  Constitutional: She is oriented to person, place, and time. She appears well-developed and well-nourished. No distress.  HENT:  Head: Normocephalic.    Mouth/Throat: Oropharynx is clear and moist.  Eyes: Right eye exhibits no discharge. Left eye exhibits no discharge.  Neck: Neck supple.  Cardiovascular: Normal rate, regular rhythm and intact distal pulses.  Pulmonary/Chest: Effort normal and breath sounds normal. No respiratory distress. She has no wheezes. She has no rales.  Abdominal: Soft. Bowel sounds are normal. She exhibits no distension. There is no abdominal tenderness. There is no guarding.  Musculoskeletal:        General: No edema.  Neurological: She is alert and oriented to person, place, and time.  Skin: Skin is warm and dry. She is not diaphoretic.  Psychiatric: She has a normal mood and affect. Her behavior is normal.     Labs on Admission: I have personally reviewed following labs and imaging studies  CBC: Recent Labs  Lab 11/29/18 1952  WBC 8.9  NEUTROABS 5.0  HGB 14.4  HCT 46.4*  MCV 87.4  PLT 427   Basic Metabolic Panel: Recent Labs  Lab 11/29/18 1952  NA 134*  K 5.9*  CL 100  CO2 21*  GLUCOSE 122*  BUN 108*  CREATININE 2.87*  CALCIUM 10.1   GFR: CrCl cannot be calculated (Unknown ideal weight.). Liver Function Tests: Recent Labs  Lab 11/29/18 1952  AST 25  ALT 22  ALKPHOS 143*  BILITOT 0.9  PROT 8.8*  ALBUMIN 4.2   No results for input(s): LIPASE, AMYLASE in the last 168 hours. No results for input(s): AMMONIA in the last 168 hours. Coagulation Profile: No results for input(s): INR, PROTIME in the last 168 hours. Cardiac Enzymes: No results for input(s): CKTOTAL, CKMB, CKMBINDEX, TROPONINI in the last 168 hours. BNP (last 3 results) No results for input(s): PROBNP in the last 8760 hours. HbA1C: No results for input(s): HGBA1C in the last 72 hours. CBG: No results for input(s): GLUCAP in the last 168 hours. Lipid Profile: No results for input(s): CHOL, HDL, LDLCALC, TRIG, CHOLHDL, LDLDIRECT in the last 72 hours. Thyroid Function Tests: No results for input(s): TSH, T4TOTAL,  FREET4, T3FREE, THYROIDAB in the last 72 hours. Anemia Panel: No results for input(s): VITAMINB12, FOLATE, FERRITIN, TIBC, IRON, RETICCTPCT in the last 72 hours. Urine analysis: No results found for: COLORURINE, APPEARANCEUR, LABSPEC, PHURINE, GLUCOSEU, HGBUR, BILIRUBINUR, KETONESUR, PROTEINUR, UROBILINOGEN, NITRITE, LEUKOCYTESUR  Radiological Exams on Admission: No results found.  EKG: Independently reviewed.  Atrial fibrillation (heart rate 76).  Assessment/Plan Principal Problem:   AKI (acute kidney injury) ([place]) Active Problems:   Hypertension, essential   Permanent atrial fibrillation   Hyperkalemia   Liver cirrhosis (H[facility])   AKI BUN 108.  Creatinine 2.87, baseline 0.9 two years ago.  Likely related to home diuretic (Lasix and spironolactone) and concomitant ARB use.  Patient was started on spironolactone a month  ago. -IV fluid hydration -Renal ultrasound -Avoid nephrotoxic agents/contrast -Continue to monitor renal function -Check UA  Hyperkalemia In the setting of home spironolactone and ARB use.  Patient was started on spironolactone a month ago.  Potassium 5.9.  EKG without acute changes. -Calcium gluconate -Insulin 10 units with D50 -Repeat BMP tonight and in the morning -Cardiac monitoring  Mildly elevated alkaline phosphatase Alkaline phosphatase 143, remainder of LFTs normal. -Check GGT level to differentiate between hepatic versus extrahepatic etiology  Paroxysmal A. Fib -Currently rate controlled.  Continue home diltiazem for rate control and Xarelto for anticoagulation.  Hypertension -Continue home diltiazem -Hold home Lasix, spironolactone, and ARB at this time in the setting of AKI and hyperkalemia  History of liver cirrhosis -Stable.  No signs of volume overload.  No encephalopathy. -Hold Lasix and spironolactone at this time -Hold lactulose at this time  Hyperlipidemia -Continue home Lipitor  Gout -Continue home allopurinol  OSA -CPAP at  night  DVT prophylaxis: Xarelto (renally dosed) Code Status: Patient wishes to be full code. Family Communication: No family available. Disposition Plan: Anticipate discharge after clinical improvement. Consults called: None Admission status: Observation, telemetry   Shela Leff MD [facility] Pager [phone]  If 7PM-7AM, please contact night-coverage [email or web address] Password [facility]  11/29/2018, 10:58 PM

## 2018-11-29 NOTE — ED Provider Notes (Signed)
[facility] EMERGENCY DEPARTMENT Provider Note   CSN: [id number] Arrival date & time: 11/29/18  1919     History   Chief Complaint Chief Complaint  Patient presents with  . Abnormal Lab    HPI Erin Holt is a 68 y.o. female.  HPI   Erin Holt is a 68 y.o. female with PMH of arthritis, paroxysmal A. fib on, carotid stenosis, glaucoma, gout, history of diastolic dysfunction without CHF, hypertension, CVA, cirrhosis who presents with reported abnormal lab collected today at her follow-up visit after starting spironolactone about 1 month ago.  She reports she was diagnosed with cirrhosis recently and underwent two paracentesis procedures.  Reports her gastroenterology started her on Spironolactone about a month ago to decrease her ascites.  Feels that she has lost a significant amount of fluid since that time and had felt well until yesterday evening when she felt slightly off balance in the evening.  It resolved and she was able to go on about her business.  Had no numbness or weakness or tingling or paresthesias.  No headache or vision changes at that time.  Felt slightly off balance only for short period of time.  Today she felt largely normal and had a regularly scheduled follow-up lab check.  Call back with a potassium of 6 and counseled to go to the ED.  She states she is in atrial fibrillation nearly all the time and has been compliant with all of her medications.  Took her Lasix and spironolactone for today already.  Took her Xarelto today as well.  No recent falls or trauma.  Has otherwise felt normal.  Past Medical History:  Diagnosis Date  . Arthritis    "left knee" (10/09/2012)  . Atrial fibrillation ([place])   . Carotid artery occlusion    a. Carotid US (12/10/13):  R 60-79%; L 1-39% - f/u 6 mos  . Glaucoma (increased eye pressure)    "both eyes" (10/09/2012)  . Gout   . Hx of echocardiogram    a. Echo (09/2012):  Mild LVH, EF 55-65%, Gr 1 DD,  MAC, mild MR, mild to mod LAE, mild RVE, PASP 44 mmHg  . Hypertension   . Stroke [facility]) 10/09/2012    Patient Active Problem List   Diagnosis Date Noted  . AKI (acute kidney injury) ([place]) 11/29/2018  . Hyperkalemia 11/29/2018  . Liver cirrhosis ([place]) 11/29/2018  . Chronic atrial fibrillation 11/14/2018  . Pulmonary HTN ([place]) 11/14/2018  . Other ascites 11/14/2018  . Permanent atrial fibrillation 08/16/2018  . Dyslipidemia 08/16/2018  . Bilateral carotid artery stenosis 02/14/2017  . Sleep apnea 02/14/2017  . Excessive daytime sleepiness 06/01/2015  . Carotid stenosis 02/10/2014  . HLD (hyperlipidemia) 02/10/2014  . Paroxysmal atrial fibrillation ([place]) 12/09/2013  . Hypertension, essential 10/11/2012  . Altered sensation due to recent cerebral infarction 10/11/2012  . CVA (cerebral infarction) 10/09/2012  . Elevated troponin 10/09/2012  . Gout 10/09/2012  . Glaucoma 10/09/2012  . Obesity 10/09/2012  . Disturbance of skin sensation 10/09/2012    Past Surgical History:  Procedure Laterality Date  . BREAST BIOPSY  1980's   "left; it wasn't anything" (10/09/2012)  . COLONOSCOPY WITH PROPOFOL N/A 12/12/2016   Procedure: COLONOSCOPY WITH PROPOFOL;  Surgeon: Garlan Fair, MD;  Location: [facility] END[facility];  Service: Endoscopy;  Laterality: N/A;  . IR PARACENTESIS  09/25/2018  . IR PARACENTESIS  10/16/2018  . KNEE ARTHROSCOPY W/ DEBRIDEMENT  2011   "left; for arthritis" (10/09/2012)  OB History   No obstetric history on file.      Home Medications    Prior to Admission medications   Medication Sig Start Date End Date Taking? Authorizing Provider  allopurinol (ZYLOPRIM) 100 MG tablet Take 100 mg by mouth every evening.   Yes [provider]  atorvastatin (LIPITOR) 40 MG tablet Take 40 mg by mouth every evening.  05/06/15  Yes [provider]  diltiazem (CARTIA XT) 240 MG 24 hr capsule Take 240 mg by mouth daily.   Yes [provider]  furosemide  (LASIX) 40 MG tablet Take 40 mg by mouth daily. 10/25/18  Yes [provider]  lactulose (CHRONULAC) 10 GM/15ML solution Take 50 g by mouth daily as needed for mild constipation.  11/06/18  Yes [provider]  latanoprost (XALATAN) 0.005 % ophthalmic solution Place 1 drop into both eyes at bedtime.   Yes [provider]  losartan (COZAAR) 100 MG tablet Take 100 mg by mouth daily.  08/15/18  Yes [provider]  PRESCRIPTION MEDICATION CPAP: At bedtime   Yes [provider]  SIMBRINZA 1-0.2 % SUSP Place 1 drop into both eyes 2 (two) times daily.  03/28/18  Yes [provider]  spironolactone (ALDACTONE) 100 MG tablet Take 100 mg by mouth daily.   Yes [provider]  XARELTO 20 MG TABS tablet TAKE 1 TABLET (20 MG TOTAL) BY MOUTH DAILY WITH SUPPER. Patient taking differently: Take 20 mg by mouth daily with supper.  06/25/18  Yes Minus Breeding, MD    Family History Family History  Problem Relation Age of Onset  . Heart failure Mother 76       Died  . Diabetes Mother   . Diabetes Father   . CAD Father 30    Social History Social History   Tobacco Use  . Smoking status: Never Smoker  . Smokeless tobacco: Never Used  Substance Use Topics  . Alcohol use: No    Alcohol/week: 0.0 standard drinks  . Drug use: No     Allergies   Patient has no known allergies.   Review of Systems Review of Systems  Constitutional: Negative for chills and fever.  HENT: Negative for ear pain and sore throat.   Eyes: Negative for pain and visual disturbance.  Respiratory: Negative for cough and shortness of breath.   Cardiovascular: Negative for chest pain and palpitations.  Gastrointestinal: Negative for abdominal pain and vomiting.  Genitourinary: Negative for dysuria and hematuria.  Musculoskeletal: Negative for arthralgias and back pain.  Skin: Negative for color change and rash.  Neurological: Positive for dizziness. Negative for  seizures, syncope, facial asymmetry, weakness, light-headedness and headaches.       Balance issue yesterday now resolved  All other systems reviewed and are negative.    Physical Exam Updated Vital Signs BP (!) 124/59   Pulse 66   Temp 97.9 F (36.6 C) (Oral)   Resp 16   SpO2 98%   Physical Exam Vitals signs and nursing note reviewed.  Constitutional:      General: She is not in acute distress.    Appearance: She is well-developed.  HENT:     Head: Normocephalic and atraumatic.  Eyes:     Conjunctiva/sclera: Conjunctivae normal.  Neck:     Musculoskeletal: Neck supple.  Cardiovascular:     Rate and Rhythm: Normal rate and regular rhythm.     Heart sounds: No murmur.  Pulmonary:     Effort: Pulmonary effort is  normal. No respiratory distress.     Breath sounds: Normal breath sounds.  Abdominal:     General: Abdomen is flat. There is no distension.     Palpations: Abdomen is soft.     Tenderness: There is no abdominal tenderness. There is no guarding or rebound. Negative signs include Murphy's sign, Rovsing's sign, McBurney's sign and obturator sign.     Comments: No appreciable ascites at this time.  Skin:    General: Skin is warm and dry.  Neurological:     General: No focal deficit present.     Mental Status: She is alert and oriented to person, place, and time.     GCS: GCS eye subscore is 4. GCS verbal subscore is 5. GCS motor subscore is 6.     Cranial Nerves: Cranial nerves are intact.     Sensory: Sensation is intact.     Motor: Motor function is intact.     Coordination: Coordination is intact.      ED Treatments / Results  Labs (all labs ordered are listed, but only abnormal results are displayed) Labs Reviewed  CBC WITH DIFFERENTIAL/PLATELET - Abnormal; Notable for the following components:      Result Value   RBC 5.31 (*)    HCT 46.4 (*)    RDW 16.1 (*)    All other components within normal limits  COMPREHENSIVE METABOLIC PANEL - Abnormal;  Notable for the following components:   Sodium 134 (*)    Potassium 5.9 (*)    CO2 21 (*)    Glucose, Bld 122 (*)    BUN 108 (*)    Creatinine, Ser 2.87 (*)    Total Protein 8.8 (*)    Alkaline Phosphatase 143 (*)    GFR calc non Af Amer 16 (*)    GFR calc Af Amer 19 (*)    All other components within normal limits  PHOSPHORUS - Abnormal; Notable for the following components:   Phosphorus 4.8 (*)    All other components within normal limits  URINALYSIS, ROUTINE W REFLEX MICROSCOPIC  MAGNESIUM  HIV ANTIBODY (ROUTINE TESTING W REFLEX)  BASIC METABOLIC PANEL  BASIC METABOLIC PANEL    EKG EKG Interpretation  Date/Time:  Thursday November 29 2018 20:09:51 EST Ventricular Rate:  76 PR Interval:    QRS Duration: 84 QT Interval:  374 QTC Calculation: 420 R Axis:   63 Text Interpretation:  Atrial fibrillation Low voltage QRS Cannot rule out Anterior infarct , age undetermined Abnormal ECG No acute changes afib is not new Confirmed by Varney Biles [phone]) on 11/29/2018 10:59:12 PM   Radiology No results found.  Procedures Procedures (including critical care time)  Medications Ordered in ED Medications  calcium gluconate 1 g/ 50 mL sodium chloride IVPB (1,000 mg Intravenous New Bag/Given 11/29/18 2336)  allopurinol (ZYLOPRIM) tablet 100 mg (has no administration in time range)  atorvastatin (LIPITOR) tablet 40 mg (has no administration in time range)  diltiazem (CARDIZEM CD) 24 hr capsule 240 mg (has no administration in time range)  latanoprost (XALATAN) 0.005 % ophthalmic solution 1 drop (has no administration in time range)  0.9 %  sodium chloride infusion (has no administration in time range)  insulin aspart (novoLOG) injection 10 Units (has no administration in time range)  dextrose 50 % solution 50 mL (has no administration in time range)     Initial Impression / Assessment and Plan / ED Course  I have reviewed the triage vital signs and the nursing  notes.  Pertinent labs & imaging results that were available during my care of the patient were reviewed by me and considered in my medical decision making (see chart for details).     MDM:  Imaging: None indicated at this time.    ED Provider Interpretation of EKG: A. fib with a rate of 76 bpm, normal axis, no ST segment elevation or depression, pathologic T wave changes including peaked T waves or hyperacute T waves.  No significant interval irregularity (other than irregular rhythm).  No bundle branch block.  Labs: CBC unremarkable, CMP with potassium of 5.9 and creatinine 2.87 (baseline around 1 dating back to December 2017), CO2 21, BUN 108 with a GFR of 19  On initial evaluation, patient appears comfortable. Afebrile and hemodynamically stable. Alert and oriented x4, pleasant, and cooperative.  Patient presents with abnormal lab as detailed above.  On exam, patient has no evidence for volume overload.  Lungs are clear bilaterally heart sounds are normal.  No increased work of breathing.  Satting well on room air.  No ascites and no tenderness to palpation throughout the abdomen in all 4 quadrants.  No lower extremity edema.  Appears slightly volume down with slightly dry mucous membranes otherwise reassuring exam.  Potassium reportedly 6.0 earlier today but not visible in the chart.  Down to 5.9 now.  Creatinine 2.87 at this time with baseline of around 1.  CO2 21 and BUN elevated at 108 although she has no other signs or symptoms of uremia.  No headache, chest pain or evidence for uremic pericarditis.  She reports that she has been urinating a significant amount after starting spironolactone but it tapered off over the last 1 to 2 days.  However, her urine output was normal today she states.  Already took her Lasix and spironolactone for the day.  Given 500 mL IV LR.  EKG without evidence for hyperkalemia at this time (intervals normal and no T wave changes).  However, given her increased  potassium that was minimally changed from earlier today she was given 1 g of IV calcium gluconate.  Labs otherwise unremarkable.  Low suspicion of infectious etiology.  Suspect her kidney injury and her elevated potassium are secondary to addition of spironolactone to her Lasix and etiology of AKI is most likely prerenal.  No indication for emergent dialysis.  Patient admitted to hospitalist service for further resuscitation and management.  The plan for this patient was discussed with Dr. Kathrynn Humble who voiced agreement and who oversaw evaluation and treatment of this patient.   The patient was fully informed and involved with the history taking, evaluation, workup including labs/images, and plan. The patient's concerns and questions were addressed to the patient's satisfaction and she expressed agreement with the plan to admit.    Final Clinical Impressions(s) / ED Diagnoses   Final diagnoses:  AKI (acute kidney injury) ([place])  Hyperkalemia    ED Discharge Orders    None       Treazure Nery, Rodena Goldmann, MD 11/29/18 1517    Varney Biles, MD 11/30/18 [phone]

## 2018-11-29 NOTE — ED Triage Notes (Signed)
Patient here due to abnormal labs.  Patient went to her GI MD and had labs drawn.  Her potassium was above 6 and her kidney functions were elevated.  Denies any chest pain or shortness of breath.  She states that she is on some meds for her cirrhosis, spironolactone and another that she was told to stop but she is unable to remember what it was.

## 2018-11-30 ENCOUNTER — Encounter (HOSPITAL_COMMUNITY): Payer: Self-pay | Admitting: *Deleted

## 2018-11-30 ENCOUNTER — Other Ambulatory Visit: Payer: Self-pay

## 2018-11-30 DIAGNOSIS — I10 Essential (primary) hypertension: Secondary | ICD-10-CM

## 2018-11-30 DIAGNOSIS — I4821 Permanent atrial fibrillation: Secondary | ICD-10-CM | POA: Diagnosis not present

## 2018-11-30 DIAGNOSIS — N179 Acute kidney failure, unspecified: Secondary | ICD-10-CM | POA: Diagnosis not present

## 2018-11-30 DIAGNOSIS — R402362 Coma scale, best motor response, obeys commands, at arrival to emergency department: Secondary | ICD-10-CM | POA: Diagnosis not present

## 2018-11-30 DIAGNOSIS — E785 Hyperlipidemia, unspecified: Secondary | ICD-10-CM | POA: Diagnosis present

## 2018-11-30 DIAGNOSIS — M109 Gout, unspecified: Secondary | ICD-10-CM | POA: Diagnosis present

## 2018-11-30 DIAGNOSIS — Z8673 Personal history of transient ischemic attack (TIA), and cerebral infarction without residual deficits: Secondary | ICD-10-CM | POA: Diagnosis not present

## 2018-11-30 DIAGNOSIS — T464X5A Adverse effect of angiotensin-converting-enzyme inhibitors, initial encounter: Secondary | ICD-10-CM | POA: Diagnosis not present

## 2018-11-30 DIAGNOSIS — Z8249 Family history of ischemic heart disease and other diseases of the circulatory system: Secondary | ICD-10-CM | POA: Diagnosis not present

## 2018-11-30 DIAGNOSIS — K7460 Unspecified cirrhosis of liver: Secondary | ICD-10-CM

## 2018-11-30 DIAGNOSIS — G4733 Obstructive sleep apnea (adult) (pediatric): Secondary | ICD-10-CM | POA: Diagnosis not present

## 2018-11-30 DIAGNOSIS — T500X5A Adverse effect of mineralocorticoids and their antagonists, initial encounter: Secondary | ICD-10-CM | POA: Diagnosis not present

## 2018-11-30 DIAGNOSIS — R402252 Coma scale, best verbal response, oriented, at arrival to emergency department: Secondary | ICD-10-CM | POA: Diagnosis not present

## 2018-11-30 DIAGNOSIS — Z79899 Other long term (current) drug therapy: Secondary | ICD-10-CM | POA: Diagnosis not present

## 2018-11-30 DIAGNOSIS — Z7901 Long term (current) use of anticoagulants: Secondary | ICD-10-CM | POA: Diagnosis not present

## 2018-11-30 DIAGNOSIS — R402142 Coma scale, eyes open, spontaneous, at arrival to emergency department: Secondary | ICD-10-CM | POA: Diagnosis not present

## 2018-11-30 DIAGNOSIS — M1712 Unilateral primary osteoarthritis, left knee: Secondary | ICD-10-CM | POA: Diagnosis present

## 2018-11-30 DIAGNOSIS — R7989 Other specified abnormal findings of blood chemistry: Secondary | ICD-10-CM | POA: Diagnosis not present

## 2018-11-30 DIAGNOSIS — E875 Hyperkalemia: Secondary | ICD-10-CM

## 2018-11-30 DIAGNOSIS — H409 Unspecified glaucoma: Secondary | ICD-10-CM | POA: Diagnosis present

## 2018-11-30 DIAGNOSIS — I272 Pulmonary hypertension, unspecified: Secondary | ICD-10-CM | POA: Diagnosis not present

## 2018-11-30 DIAGNOSIS — Z833 Family history of diabetes mellitus: Secondary | ICD-10-CM | POA: Diagnosis not present

## 2018-11-30 LAB — BASIC METABOLIC PANEL
Anion gap: 11 (ref 5–15)
BUN: 102 mg/dL — ABNORMAL HIGH (ref 8–23)
CHLORIDE: 109 mmol/L (ref 98–111)
CO2: 19 mmol/L — AB (ref 22–32)
Calcium: 9.4 mg/dL (ref 8.9–10.3)
Creatinine, Ser: 2.69 mg/dL — ABNORMAL HIGH (ref 0.44–1.00)
GFR calc non Af Amer: 17 mL/min — ABNORMAL LOW (ref >60)
GFR, EST AFRICAN AMERICAN: 20 mL/min — AB (ref >60)
Glucose, Bld: 129 mg/dL — ABNORMAL HIGH (ref 70–99)
Potassium: 5.2 mmol/L — ABNORMAL HIGH (ref 3.5–5.1)
Sodium: 139 mmol/L (ref 135–145)

## 2018-11-30 LAB — HIV ANTIBODY (ROUTINE TESTING W REFLEX): HIV Screen 4th Generation wRfx: NONREACTIVE

## 2018-11-30 MED ORDER — RIVAROXABAN 15 MG PO TABS
15.0000 mg | ORAL_TABLET | Freq: Every day | ORAL | Status: DC
  Administered 2018-11-30 – 2018-12-01 (×2): 15 mg via ORAL
  Filled 2018-11-30 (×2): qty 1

## 2018-11-30 MED ORDER — SODIUM CHLORIDE 0.9 % IV SOLN
INTRAVENOUS | Status: AC
  Administered 2018-11-30: 11:00:00 via INTRAVENOUS

## 2018-11-30 NOTE — Progress Notes (Signed)
[facility] PROGRESS NOTE    Progress Note  [name]  HCW:[id number] DOB: 1951/04/07 DOA: 11/29/2018 PCP: Leeroy Cha, MD     Brief Narrative:   Erin Holt is an 68 y.o. female past medical history significant for chronic atrial fibrillation, carotid stenosis CVA, recently diagnosed with liver cirrhosis from December 2019, had 2 paracentesis done started on Aldactone sent to the hospital by GI for evaluation of her hyperkalemia and acute kidney injury.  Her potassium was above 6  Assessment/Plan:   AKI (acute kidney injury) ([place]) With a baseline creatinine of less than 1 back in 2017. Likely prerenal in the setting of recent diuretic use, and ACE inhibitor. Her creatinine on admission is 2.8 after IV IV fluid hydration  and holding of diuretics her creatinine today is 2.6. We will continue gentle IV fluid hydration. Recheck a basic metabolic panel in the morning. Decrease IV fluids and continue for another 12 hours.  hyPerkalemia: In the setting of acute kidney injury, ARB and Aldactone use. She was given calcium gluconate IV insulin and IV fluids her potassium is improving slowly.  Mildly elevated alkaline phosphatase: Other LFTs are within normal, in the setting of cirrhosis.  Paroxysmal atrial fibrillation: Currently rate controlled continue home diltiazem and Xarelto for anticoagulation.  Essential hypertension: Continue diltiazem hold hold other antihypertensive medication as stated above.   DVT prophylaxis: Xarelto Family Communication:none Disposition Plan/Barrier to D/C: home once Cr improved Code Status:     Code Status Orders  (From admission, onward)         Start     Ordered   11/29/18 2316  Full code  Continuous     11/29/18 2316        Code Status History    This patient has a current code status but no historical code status.    Advance Directive Documentation     Most Recent Value  Type of Advance Directive   Living will  Pre-existing out of facility DNR order (yellow form or pink MOST form)  -  "MOST" Form in Place?  -        IV Access:    Peripheral IV   Procedures and diagnostic studies:   US Renal  Result Date: 11/30/2018 CLINICAL DATA:  Acute renal injury EXAM: RENAL / URINARY TRACT ULTRASOUND COMPLETE COMPARISON:  None. FINDINGS: Right Kidney: Renal measurements: 11.1 x 5.9 x 4.9 cm = volume: 174 mL . Echogenicity within normal limits. No solid mass or hydronephrosis visualized. Cysts arising from the upper and lower pole are identified of the right kidney measuring 2.7 x 2.6 x 2.7 cm in the upper pole and 1.7 x 1.2 x 1.4 cm in the lower pole. Left Kidney: Renal measurements: 8.8 x 4.1 x 4.2 cm = volume: 82.1 mL. Echogenicity within normal limits. No solid mass or hydronephrosis visualized. A simple appearing cyst in the upper pole the right kidney is identified measuring approximately 1 cm in diameter. Bladder: Appears normal for degree of bladder distention. IMPRESSION: Bilateral renal simple appearing cysts. No obstructive uropathy or solid appearing masses. Maintained cortical-medullary distinction within both kidneys. Electronically Signed   By: Ashley Royalty M.D.   On: 11/30/2018 00:18     Medical Consultants:    None.  Anti-Infectives:   None  Subjective:    [name] relates no new complaints.  Objective:    Vitals:   11/29/18 2330 11/30/18 0130 11/30/18 0552 11/30/18 0845  BP: (!) 124/59 136/75 (!) 109/59 124/68  Pulse: 66 65 80 70  Resp: 16 14 16 18   Temp:  97.8 F (36.6 C) 97.9 F (36.6 C) 98.2 F (36.8 C)  TempSrc:  Oral Oral Oral  SpO2: 98% 100% 100% 100%  Weight:  82.9 kg      Intake/Output Summary (Last 24 hours) at 11/30/2018 0847 Last data filed at 11/30/2018 0600 Gross per 24 hour  Intake 0 ml  Output 200 ml  Net -200 ml   Filed Weights   11/30/18 0130  Weight: 82.9 kg    Exam: General exam: In no acute distress. Respiratory  system: Good air movement and clear to auscultation. Cardiovascular system: S1 & S2 heard, RRR.   Gastrointestinal system: Abdomen is nondistended, soft and nontender.  Central nervous system: Alert and oriented. No focal neurological deficits. Extremities: No pedal edema. Skin: No rashes, lesions or ulcers Psychiatry: Judgement and insight appear normal. Mood & affect appropriate.    Data Reviewed:    Labs: Basic Metabolic Panel: Recent Labs  Lab 11/29/18 1952 11/29/18 2255 11/30/18 0458  NA 134*  --  139  K 5.9*  --  5.2*  CL 100  --  109  CO2 21*  --  19*  GLUCOSE 122*  --  129*  BUN 108*  --  102*  CREATININE 2.87*  --  2.69*  CALCIUM 10.1  --  9.4  MG  --  2.0  --   PHOS  --  4.8*  --    GFR Estimated Creatinine Clearance: 21.1 mL/min (A) (by C-G formula based on SCr of 2.69 mg/dL (H)). Liver Function Tests: Recent Labs  Lab 11/29/18 1952  AST 25  ALT 22  ALKPHOS 143*  BILITOT 0.9  PROT 8.8*  ALBUMIN 4.2   No results for input(s): LIPASE, AMYLASE in the last 168 hours. No results for input(s): AMMONIA in the last 168 hours. Coagulation profile No results for input(s): INR, PROTIME in the last 168 hours.  CBC: Recent Labs  Lab 11/29/18 1952  WBC 8.9  NEUTROABS 5.0  HGB 14.4  HCT 46.4*  MCV 87.4  PLT 222   Cardiac Enzymes: No results for input(s): CKTOTAL, CKMB, CKMBINDEX, TROPONINI in the last 168 hours. BNP (last 3 results) No results for input(s): PROBNP in the last 8760 hours. CBG: No results for input(s): GLUCAP in the last 168 hours. D-Dimer: No results for input(s): DDIMER in the last 72 hours. Hgb A1c: No results for input(s): HGBA1C in the last 72 hours. Lipid Profile: No results for input(s): CHOL, HDL, LDLCALC, TRIG, CHOLHDL, LDLDIRECT in the last 72 hours. Thyroid function studies: No results for input(s): TSH, T4TOTAL, T3FREE, THYROIDAB in the last 72 hours.  Invalid input(s): FREET3 Anemia work up: No results for input(s):  VITAMINB12, FOLATE, FERRITIN, TIBC, IRON, RETICCTPCT in the last 72 hours. Sepsis Labs: Recent Labs  Lab 11/29/18 1952  WBC 8.9   Microbiology No results found for this or any previous visit (from the past 240 hour(s)).   Medications:   . allopurinol  100 mg Oral QPM  . atorvastatin  40 mg Oral QPM  . diltiazem  240 mg Oral Daily  . latanoprost  1 drop Both Eyes QHS   Continuous Infusions: . sodium chloride 125 mL/hr at 11/30/18 0231      LOS: 0 days   Charlynne Cousins  [facility]  11/30/2018, 8:47 AM

## 2018-11-30 NOTE — ED Notes (Signed)
Pt room assignment has been removed. Bed placement re assign new room

## 2018-11-30 NOTE — Progress Notes (Signed)
ANTICOAGULATION CONSULT NOTE - F/u Consult  Pharmacy Consult for Xarelto Indication: atrial fibrillation  No Known Allergies  Patient Measurements: Weight: 182 lb 11.2 oz (82.9 kg)   Vital Signs: Temp: 98.2 F (36.8 C) (01/31 0845) Temp Source: Oral (01/31 0845) BP: 124/68 (01/31 0845) Pulse Rate: 70 (01/31 0845)  Labs: Recent Labs    11/29/18 1952 11/30/18 0458  HGB 14.4  --   HCT 46.4*  --   PLT 222  --   CREATININE 2.87* 2.69*    Estimated Creatinine Clearance: 21.1 mL/min (A) (by C-G formula based on SCr of 2.69 mg/dL (H)).   Medical History: Past Medical History:  Diagnosis Date  . Arthritis    "left knee" (10/09/2012)  . Atrial fibrillation ([place])   . Carotid artery occlusion    a. Carotid US (12/10/13):  R 60-79%; L 1-39% - f/u 6 mos  . Glaucoma (increased eye pressure)    "both eyes" (10/09/2012)  . Gout   . Hx of echocardiogram    a. Echo (09/2012):  Mild LVH, EF 55-65%, Gr 1 DD, MAC, mild MR, mild to mod LAE, mild RVE, PASP 44 mmHg  . Hypertension   . Stroke ([place]) 10/09/2012    Medications:  Medications Prior to Admission  Medication Sig Dispense Refill Last Dose  . allopurinol (ZYLOPRIM) 100 MG tablet Take 100 mg by mouth every evening.   11/29/2018 at pm  . atorvastatin (LIPITOR) 40 MG tablet Take 40 mg by mouth every evening.   0 11/29/2018 at pm  . diltiazem (CARTIA XT) 240 MG 24 hr capsule Take 240 mg by mouth daily.   11/29/2018 at Unknown time  . furosemide (LASIX) 40 MG tablet Take 40 mg by mouth daily.   11/29/2018 at am  . lactulose (CHRONULAC) 10 GM/15ML solution Take 50 g by mouth daily as needed for mild constipation.    11/29/2018 at Unknown time  . latanoprost (XALATAN) 0.005 % ophthalmic solution Place 1 drop into both eyes at bedtime.   11/28/2018 at pm  . losartan (COZAAR) 100 MG tablet Take 100 mg by mouth daily.    11/29/2018 at Unknown time  . PRESCRIPTION MEDICATION CPAP: At bedtime   11/28/2018 at pm  . SIMBRINZA 1-0.2 % SUSP Place 1  drop into both eyes 2 (two) times daily.    11/28/2018 at Unknown time  . spironolactone (ALDACTONE) 100 MG tablet Take 100 mg by mouth daily.   11/29/2018 at Unknown time  . XARELTO 20 MG TABS tablet TAKE 1 TABLET (20 MG TOTAL) BY MOUTH DAILY WITH SUPPER. (Patient taking differently: Take 20 mg by mouth daily with supper. ) 90 tablet 1 11/29/2018 at 1700    Assessment: 68 y.o. female admitted with ARF and hyperkalemia, h/o Afib on Xarelto. Pharmacy consulted to dose Xarelto. Due to AKI pt will require a reduced dose of Xarelto until resolved/improved.  Plan:  Xarelto 15 mg PO daily with supper Monitor renal function changes, signs/symptoms of bleeding.   Thank you for allowing [place] to participate in this patients care.   Jens Som, PharmD Please utilize [facility] (under [place]) for appropriate number for your unit pharmacist. 11/30/2018 10:52 AM

## 2018-11-30 NOTE — Care Management Note (Signed)
Case Management Note Manya Silvas, RN MSN CCM Transitions of Care 4M [place] [phone]  Patient Details  Name: Erin Holt MRN: [id number] Date of Birth: [birth year]  Subjective/Objective:             AKI       Action/Plan: PTA home alone. Independent. Still working. Drives self for transportation. States she will call a cab when ready to transition home. No HH or DME needs. No issues with obtaining or self-administering medications. Pt asked about creatinine monitoring-significance of creatinine discussed briefly. Patient is hopeful to transition home tomorrow stating she has church duties pending on Sunday. No transition of care needs identified at this time.   Expected Discharge Date:                  Expected Discharge Plan:  Home/Self Care  In-House Referral:  NA  Discharge planning Services  CM Consult  Post Acute Care Choice:  NA Choice offered to:  NA  DME Arranged:  N/A DME Agency:  NA  HH Arranged:  NA HH Agency:  NA  Status of Service:  Completed, signed off  If discussed at [place] of Stay Meetings, dates discussed:    Additional Comments:  Bartholomew Crews, RN 11/30/2018, 1:25 PM

## 2018-11-30 NOTE — Progress Notes (Signed)
ANTICOAGULATION CONSULT NOTE - Initial Consult  Pharmacy Consult for Xarelto Indication: atrial fibrillation  No Known Allergies  Patient Measurements:     Vital Signs: Temp: 97.8 F (36.6 C) (01/31 0130) Temp Source: Oral (01/31 0130) BP: 136/75 (01/31 0130) Pulse Rate: 65 (01/31 0130)  Labs: Recent Labs    11/29/18 1952  HGB 14.4  HCT 46.4*  PLT 222  CREATININE 2.87*    CrCl cannot be calculated (Unknown ideal weight.).   Medical History: Past Medical History:  Diagnosis Date  . Arthritis    "left knee" (10/09/2012)  . Atrial fibrillation ([place])   . Carotid artery occlusion    a. Carotid US (12/10/13):  R 60-79%; L 1-39% - f/u 6 mos  . Glaucoma (increased eye pressure)    "both eyes" (10/09/2012)  . Gout   . Hx of echocardiogram    a. Echo (09/2012):  Mild LVH, EF 55-65%, Gr 1 DD, MAC, mild MR, mild to mod LAE, mild RVE, PASP 44 mmHg  . Hypertension   . Stroke ([place]) 10/09/2012    Medications:  Medications Prior to Admission  Medication Sig Dispense Refill Last Dose  . allopurinol (ZYLOPRIM) 100 MG tablet Take 100 mg by mouth every evening.   11/29/2018 at pm  . atorvastatin (LIPITOR) 40 MG tablet Take 40 mg by mouth every evening.   0 11/29/2018 at pm  . diltiazem (CARTIA XT) 240 MG 24 hr capsule Take 240 mg by mouth daily.   11/29/2018 at Unknown time  . furosemide (LASIX) 40 MG tablet Take 40 mg by mouth daily.   11/29/2018 at am  . lactulose (CHRONULAC) 10 GM/15ML solution Take 50 g by mouth daily as needed for mild constipation.    11/29/2018 at Unknown time  . latanoprost (XALATAN) 0.005 % ophthalmic solution Place 1 drop into both eyes at bedtime.   11/28/2018 at pm  . losartan (COZAAR) 100 MG tablet Take 100 mg by mouth daily.    11/29/2018 at Unknown time  . PRESCRIPTION MEDICATION CPAP: At bedtime   11/28/2018 at pm  . SIMBRINZA 1-0.2 % SUSP Place 1 drop into both eyes 2 (two) times daily.    11/28/2018 at Unknown time  . spironolactone (ALDACTONE) 100 MG  tablet Take 100 mg by mouth daily.   11/29/2018 at Unknown time  . XARELTO 20 MG TABS tablet TAKE 1 TABLET (20 MG TOTAL) BY MOUTH DAILY WITH SUPPER. (Patient taking differently: Take 20 mg by mouth daily with supper. ) 90 tablet 1 11/29/2018 at 1700    Assessment: 68 y.o. female admitted with ARF and hyperkalemia, h/o Afib, for Xarelto.    Plan:  F/U [facility] in am as may need Xarelto dose adjustment if renal function does not improve.  Offie Waide, [place] 11/30/2018,1:38 AM

## 2018-11-30 NOTE — ED Notes (Signed)
Pt is in ultrasound. Report has been called to the floor, she will be transported when she returns back.

## 2018-12-01 LAB — BASIC METABOLIC PANEL
Anion gap: 9 (ref 5–15)
Anion gap: 9 (ref 5–15)
Anion gap: 8 (ref 5–15)
BUN: 72 mg/dL — ABNORMAL HIGH (ref 8–23)
BUN: 64 mg/dL — ABNORMAL HIGH (ref 8–23)
BUN: 58 mg/dL — ABNORMAL HIGH (ref 8–23)
CO2: 17 mmol/L — ABNORMAL LOW (ref 22–32)
CO2: 19 mmol/L — ABNORMAL LOW (ref 22–32)
CO2: 20 mmol/L — ABNORMAL LOW (ref 22–32)
CREATININE: 1.83 mg/dL — AB (ref 0.44–1.00)
CREATININE: 1.85 mg/dL — AB (ref 0.44–1.00)
CREATININE: 1.72 mg/dL — AB (ref 0.44–1.00)
Calcium: 9.8 mg/dL (ref 8.9–10.3)
Calcium: 10 mg/dL (ref 8.9–10.3)
Calcium: 9.5 mg/dL (ref 8.9–10.3)
Chloride: 111 mmol/L (ref 98–111)
Chloride: 112 mmol/L — ABNORMAL HIGH (ref 98–111)
Chloride: 107 mmol/L (ref 98–111)
GFR calc Af Amer: 32 mL/min — ABNORMAL LOW (ref >60)
GFR calc Af Amer: 32 mL/min — ABNORMAL LOW (ref >60)
GFR calc non Af Amer: 28 mL/min — ABNORMAL LOW (ref >60)
GFR calc non Af Amer: 27 mL/min — ABNORMAL LOW (ref >60)
GFR calc non Af Amer: 30 mL/min — ABNORMAL LOW (ref >60)
GFR, EST AFRICAN AMERICAN: 35 mL/min — AB (ref >60)
Glucose, Bld: 113 mg/dL — ABNORMAL HIGH (ref 70–99)
Glucose, Bld: 136 mg/dL — ABNORMAL HIGH (ref 70–99)
Glucose, Bld: 97 mg/dL (ref 70–99)
Potassium: 6.3 mmol/L (ref 3.5–5.1)
Potassium: 5.1 mmol/L (ref 3.5–5.1)
Potassium: 5.7 mmol/L — ABNORMAL HIGH (ref 3.5–5.1)
Sodium: 137 mmol/L (ref 135–145)
Sodium: 140 mmol/L (ref 135–145)
Sodium: 135 mmol/L (ref 135–145)

## 2018-12-01 MED ORDER — SODIUM CHLORIDE 0.9 % IV SOLN
INTRAVENOUS | Status: DC
  Administered 2018-12-01 – 2018-12-02 (×3): via INTRAVENOUS

## 2018-12-01 MED ORDER — SODIUM POLYSTYRENE SULFONATE 15 GM/60ML PO SUSP
30.0000 g | Freq: Once | ORAL | Status: AC
  Administered 2018-12-01: 30 g via ORAL
  Filled 2018-12-01: qty 120

## 2018-12-01 MED ORDER — SODIUM CHLORIDE 0.9 % IV BOLUS
500.0000 mL | Freq: Once | INTRAVENOUS | Status: AC
  Administered 2018-12-01: 500 mL via INTRAVENOUS

## 2018-12-01 NOTE — Progress Notes (Addendum)
[facility] PROGRESS NOTE    Progress Note  Erin Holt  YQI:[id number] DOB: 02-Jul-1951 DOA: 11/29/2018 PCP: Leeroy Cha, MD     Brief Narrative:   Erin Holt is an 68 y.o. female past medical history significant for chronic atrial fibrillation, carotid stenosis CVA, recently diagnosed with liver cirrhosis from December 2019, had 2 paracentesis done started on Aldactone sent to the hospital by GI for evaluation of her hyperkalemia and acute kidney injury.  Her potassium was above 6  Assessment/Plan:   AKI (acute kidney injury) ([place]) With a baseline creatinine of less than 1 back in 2017. Likely prerenal in the setting of recent diuretic use, and ACE inhibitor. Creatinine is improving slowly with IV fluids will continue normal saline. Recheck a basic metabolic panel in the morning.  HyPerkalemia: In the setting of acute kidney injury, ARB and Aldactone use. Worsening hyperkalemia,on  no potassium supplements likely residual effect from ACE inhibitor and Aldactone. We will give her oral Kayexalate recheck a basic metabolic panel this afternoon, resume IV fluids. Check a basic metabolic panel in am.  Mildly elevated alkaline phosphatase: Other LFTs are within normal, in the setting of cirrhosis.  Paroxysmal atrial fibrillation: Currently rate controlled continue home diltiazem and Xarelto for anticoagulation.  Essential hypertension: Continue diltiazem , hold other antihypertensive medication as stated above. Blood Pressure has remained stable.   DVT prophylaxis: Xarelto Family Communication:none Disposition Plan/Barrier to D/C: home once Cr improved Code Status:     Code Status Orders  (From admission, onward)         Start     Ordered   11/29/18 2316  Full code  Continuous     11/29/18 2316        Code Status History    This patient has a current code status but no historical code status.    Advance Directive Documentation    Most Recent Value  Type of Advance Directive  Living will  Pre-existing out of facility DNR order (yellow form or pink MOST form)  -  "MOST" Form in Place?  -        IV Access:    Peripheral IV   Procedures and diagnostic studies:   US Renal  Result Date: 11/30/2018 CLINICAL DATA:  Acute renal injury EXAM: RENAL / URINARY TRACT ULTRASOUND COMPLETE COMPARISON:  None. FINDINGS: Right Kidney: Renal measurements: 11.1 x 5.9 x 4.9 cm = volume: 174 mL . Echogenicity within normal limits. No solid mass or hydronephrosis visualized. Cysts arising from the upper and lower pole are identified of the right kidney measuring 2.7 x 2.6 x 2.7 cm in the upper pole and 1.7 x 1.2 x 1.4 cm in the lower pole. Left Kidney: Renal measurements: 8.8 x 4.1 x 4.2 cm = volume: 82.1 mL. Echogenicity within normal limits. No solid mass or hydronephrosis visualized. A simple appearing cyst in the upper pole the right kidney is identified measuring approximately 1 cm in diameter. Bladder: Appears normal for degree of bladder distention. IMPRESSION: Bilateral renal simple appearing cysts. No obstructive uropathy or solid appearing masses. Maintained cortical-medullary distinction within both kidneys. Electronically Signed   By: Ashley Royalty M.D.   On: 11/30/2018 00:18     Medical Consultants:    None.  Anti-Infectives:   None  Subjective:    [name] relates no new complaints.  Objective:    Vitals:   11/30/18 1626 11/30/18 2000 11/30/18 2213 12/01/18 0554  BP: 118/66  122/76 132/69  Pulse: 72  85 76  Resp: 18  18 14   Temp: 98.5 F (36.9 C)  98.7 F (37.1 C) 98.1 F (36.7 C)  TempSrc: Oral  Oral Oral  SpO2: 100%  99% 100%  Weight:   85.5 kg   Height:  5' 4.5" (1.638 m)      Intake/Output Summary (Last 24 hours) at 12/01/2018 0854 Last data filed at 12/01/2018 0601 Gross per 24 hour  Intake 1792.7 ml  Output 1180 ml  Net 612.7 ml   Filed Weights   11/30/18 0130 11/30/18 2213    Weight: 82.9 kg 85.5 kg    Exam: General exam: In no acute distress. Respiratory system: Good air movement and clear to auscultation. Cardiovascular system: S1 & S2 heard, RRR.   Gastrointestinal system: Abdomen is nondistended, soft and nontender.  Central nervous system: Alert and oriented. No focal neurological deficits. Extremities: No pedal edema. Skin: No rashes, lesions or ulcers Psychiatry: Judgement and insight appear normal. Mood & affect appropriate.    Data Reviewed:    Labs: Basic Metabolic Panel: Recent Labs  Lab 11/29/18 1952 11/29/18 2255 11/30/18 0458 12/01/18 0439  NA 134*  --  139 137  K 5.9*  --  5.2* 6.3*  CL 100  --  109 111  CO2 21*  --  19* 17*  GLUCOSE 122*  --  129* 113*  BUN 108*  --  102* 72*  CREATININE 2.87*  --  2.69* 1.83*  CALCIUM 10.1  --  9.4 9.8  MG  --  2.0  --   --   PHOS  --  4.8*  --   --    GFR Estimated Creatinine Clearance: 31.4 mL/min (A) (by C-G formula based on SCr of 1.83 mg/dL (H)). Liver Function Tests: Recent Labs  Lab 11/29/18 1952  AST 25  ALT 22  ALKPHOS 143*  BILITOT 0.9  PROT 8.8*  ALBUMIN 4.2   No results for input(s): LIPASE, AMYLASE in the last 168 hours. No results for input(s): AMMONIA in the last 168 hours. Coagulation profile No results for input(s): INR, PROTIME in the last 168 hours.  CBC: Recent Labs  Lab 11/29/18 1952  WBC 8.9  NEUTROABS 5.0  HGB 14.4  HCT 46.4*  MCV 87.4  PLT 222   Cardiac Enzymes: No results for input(s): CKTOTAL, CKMB, CKMBINDEX, TROPONINI in the last 168 hours. BNP (last 3 results) No results for input(s): PROBNP in the last 8760 hours. CBG: No results for input(s): GLUCAP in the last 168 hours. D-Dimer: No results for input(s): DDIMER in the last 72 hours. Hgb A1c: No results for input(s): HGBA1C in the last 72 hours. Lipid Profile: No results for input(s): CHOL, HDL, LDLCALC, TRIG, CHOLHDL, LDLDIRECT in the last 72 hours. Thyroid function studies: No  results for input(s): TSH, T4TOTAL, T3FREE, THYROIDAB in the last 72 hours.  Invalid input(s): FREET3 Anemia work up: No results for input(s): VITAMINB12, FOLATE, FERRITIN, TIBC, IRON, RETICCTPCT in the last 72 hours. Sepsis Labs: Recent Labs  Lab 11/29/18 1952  WBC 8.9   Microbiology No results found for this or any previous visit (from the past 240 hour(s)).   Medications:   . allopurinol  100 mg Oral QPM  . atorvastatin  40 mg Oral QPM  . diltiazem  240 mg Oral Daily  . latanoprost  1 drop Both Eyes QHS  . rivaroxaban  15 mg Oral Q supper   Continuous Infusions:     LOS: 1 day   Charlynne Cousins  [facility]  12/01/2018, 8:54 AM

## 2018-12-01 NOTE — Progress Notes (Signed)
   12/01/18 1031  Provider Notification  Provider Name/Title [facility] NP  Date Provider Notified 12/01/18  Time Provider Notified [phone]  Notification Type Page  Notification Reason Change in status (K+ is 6.3)  Response See new orders  Date of Provider Response 12/01/18  Time of Provider Response 0630   New orders received and implemented.  Will continue to monitor patient.  Earleen Reaper RN

## 2018-12-02 LAB — BASIC METABOLIC PANEL
Anion gap: 8 (ref 5–15)
BUN: 44 mg/dL — AB (ref 8–23)
CALCIUM: 9.4 mg/dL (ref 8.9–10.3)
CO2: 19 mmol/L — ABNORMAL LOW (ref 22–32)
CREATININE: 1.3 mg/dL — AB (ref 0.44–1.00)
Chloride: 112 mmol/L — ABNORMAL HIGH (ref 98–111)
GFR calc Af Amer: 49 mL/min — ABNORMAL LOW (ref >60)
GFR calc non Af Amer: 42 mL/min — ABNORMAL LOW (ref >60)
Glucose, Bld: 108 mg/dL — ABNORMAL HIGH (ref 70–99)
Potassium: 5.1 mmol/L (ref 3.5–5.1)
Sodium: 139 mmol/L (ref 135–145)

## 2018-12-02 MED ORDER — SODIUM POLYSTYRENE SULFONATE 15 GM/60ML PO SUSP: 30.0000 g | Freq: Once | ORAL | Status: DC

## 2018-12-02 MED ORDER — PATIROMER SORBITEX CALCIUM 8.4 G PO PACK
8.4000 g | PACK | Freq: Every day | ORAL | Status: DC
  Administered 2018-12-02: 8.4 g via ORAL
  Filled 2018-12-02 (×2): qty 1

## 2018-12-02 MED ORDER — RIVAROXABAN 20 MG PO TABS
20.0000 mg | ORAL_TABLET | Freq: Every day | ORAL | Status: DC
  Administered 2018-12-02: 20 mg via ORAL
  Filled 2018-12-02: qty 1

## 2018-12-02 NOTE — Progress Notes (Signed)
RT offered pt CPAP dream station for the night and pt declined. RT will continue to monitor.

## 2018-12-02 NOTE — Progress Notes (Signed)
Pt refusing CPAP at this time , stating she would rather not wear one here.

## 2018-12-02 NOTE — Progress Notes (Signed)
[facility] PROGRESS NOTE    Progress Note  Erin Holt  PTW:[id number] DOB: [birth year] DOA: 11/29/2018 PCP: Leeroy Cha, MD     Brief Narrative:   Erin Holt is an 68 y.o. female past medical history significant for chronic atrial fibrillation, carotid stenosis CVA, recently diagnosed with liver cirrhosis from December 2019, had 2 paracentesis done started on Aldactone sent to the hospital by GI for evaluation of her hyperkalemia and acute kidney injury.  Her potassium was above 6  Assessment/Plan:   AKI (acute kidney injury) ([place]) With a baseline creatinine of less than 1 back in 2017. Likely prerenal in the setting of recent diuretic use, and ACE inhibitor. Creatinine continues to improve with IV fluid hydration. Continue normal saline recheck basic metabolic panel in the morning.  Hyperkalemia: In the setting of acute kidney injury, ARB and Aldactone use. To control hyperkalemia, today's 5.1. Her potassium is hard to control, her spironolactone and ACE inhibitor should start wearing off her system, in the meantime we will continue check basic metabolic panel regularly. We will change her to [facility], recheck a basic metabolic panel in the morning.  Mildly elevated alkaline phosphatase: Other LFT's are within normal, in the setting of cirrhosis.  Paroxysmal atrial fibrillation: Currently rate controlled continue home diltiazem and Xarelto for anticoagulation.  Essential hypertension: Continue diltiazem , hold other antihypertensive medication as stated above. Blood Pressure has remained stable.   DVT prophylaxis: Xarelto Family Communication:none Disposition Plan/Barrier to D/C: home once Cr improved Code Status:     Code Status Orders  (From admission, onward)         Start     Ordered   11/29/18 2316  Full code  Continuous     11/29/18 2316        Code Status History    This patient has a current code status but no historical  code status.    Advance Directive Documentation     Most Recent Value  Type of Advance Directive  Living will  Pre-existing out of facility DNR order (yellow form or pink MOST form)  -  "MOST" Form in Place?  -        IV Access:    Peripheral IV   Procedures and diagnostic studies:   No results found.   Medical Consultants:    None.  Anti-Infectives:   None  Subjective:    Erin Holt relates no new complaints.  Objective:    Vitals:   12/01/18 0902 12/01/18 1623 12/01/18 2230 12/02/18 0455  BP: 121/81 134/90 (!) 153/102 (!) 145/91  Pulse: 84 83 87 78  Resp: 18 18 20 20   Temp: 98.1 F (36.7 C) 98 F (36.7 C) 98.4 F (36.9 C) 98.5 F (36.9 C)  TempSrc: Oral Oral Oral Oral  SpO2: 100% 100% 100% 100%  Weight:      Height:        Intake/Output Summary (Last 24 hours) at 12/02/2018 0926 Last data filed at 12/02/2018 0600 Gross per 24 hour  Intake 1407.6 ml  Output 1525 ml  Net -117.4 ml   Filed Weights   11/30/18 0130 11/30/18 2213  Weight: 82.9 kg 85.5 kg    Exam: General exam: In no acute distress. Respiratory system: Good air movement and clear to auscultation. Cardiovascular system: S1 & S2 heard, RRR.   Gastrointestinal system: Abdomen is nondistended, soft and nontender.  Central nervous system: Alert and oriented. No focal neurological deficits. Extremities: No pedal edema. Skin: No rashes,  lesions or ulcers Psychiatry: Judgement and insight appear normal. Mood & affect appropriate.    Data Reviewed:    Labs: Basic Metabolic Panel: Recent Labs  Lab 11/29/18 2255 11/30/18 0458 12/01/18 0439 12/01/18 0915 12/01/18 1455 12/02/18 0728  NA  --  139 137 140 135 139  K  --  5.2* 6.3* 5.1 5.7* 5.1  CL  --  109 111 112* 107 112*  CO2  --  19* 17* 19* 20* 19*  GLUCOSE  --  129* 113* 136* 97 108*  BUN  --  102* 72* 64* 58* 44*  CREATININE  --  2.69* 1.83* 1.85* 1.72* 1.30*  CALCIUM  --  9.4 9.8 10.0 9.5 9.4  MG 2.0  --   --    --   --   --   PHOS 4.8*  --   --   --   --   --    GFR Estimated Creatinine Clearance: 44.3 mL/min (A) (by C-G formula based on SCr of 1.3 mg/dL (H)). Liver Function Tests: Recent Labs  Lab 11/29/18 1952  AST 25  ALT 22  ALKPHOS 143*  BILITOT 0.9  PROT 8.8*  ALBUMIN 4.2   No results for input(s): LIPASE, AMYLASE in the last 168 hours. No results for input(s): AMMONIA in the last 168 hours. Coagulation profile No results for input(s): INR, PROTIME in the last 168 hours.  CBC: Recent Labs  Lab 11/29/18 1952  WBC 8.9  NEUTROABS 5.0  HGB 14.4  HCT 46.4*  MCV 87.4  PLT 222   Cardiac Enzymes: No results for input(s): CKTOTAL, CKMB, CKMBINDEX, TROPONINI in the last 168 hours. BNP (last 3 results) No results for input(s): PROBNP in the last 8760 hours. CBG: No results for input(s): GLUCAP in the last 168 hours. D-Dimer: No results for input(s): DDIMER in the last 72 hours. Hgb A1c: No results for input(s): HGBA1C in the last 72 hours. Lipid Profile: No results for input(s): CHOL, HDL, LDLCALC, TRIG, CHOLHDL, LDLDIRECT in the last 72 hours. Thyroid function studies: No results for input(s): TSH, T4TOTAL, T3FREE, THYROIDAB in the last 72 hours.  Invalid input(s): FREET3 Anemia work up: No results for input(s): VITAMINB12, FOLATE, FERRITIN, TIBC, IRON, RETICCTPCT in the last 72 hours. Sepsis Labs: Recent Labs  Lab 11/29/18 1952  WBC 8.9   Microbiology No results found for this or any previous visit (from the past 240 hour(s)).   Medications:   . allopurinol  100 mg Oral QPM  . atorvastatin  40 mg Oral QPM  . diltiazem  240 mg Oral Daily  . latanoprost  1 drop Both Eyes QHS  . rivaroxaban  15 mg Oral Q supper  . sodium polystyrene  30 g Oral Once   Continuous Infusions: . sodium chloride 75 mL/hr at 12/02/18 0033      LOS: 2 days   Charlynne Cousins  [facility]  12/02/2018, 9:26 AM

## 2018-12-03 LAB — BASIC METABOLIC PANEL
Anion gap: 7 (ref 5–15)
BUN: 39 mg/dL — ABNORMAL HIGH (ref 8–23)
CHLORIDE: 114 mmol/L — AB (ref 98–111)
CO2: 18 mmol/L — ABNORMAL LOW (ref 22–32)
Calcium: 8.8 mg/dL — ABNORMAL LOW (ref 8.9–10.3)
Creatinine, Ser: 1.34 mg/dL — ABNORMAL HIGH (ref 0.44–1.00)
GFR calc Af Amer: 47 mL/min — ABNORMAL LOW (ref >60)
GFR calc non Af Amer: 41 mL/min — ABNORMAL LOW (ref >60)
Glucose, Bld: 104 mg/dL — ABNORMAL HIGH (ref 70–99)
Potassium: 5.2 mmol/L — ABNORMAL HIGH (ref 3.5–5.1)
Sodium: 139 mmol/L (ref 135–145)

## 2018-12-03 MED ORDER — PATIROMER SORBITEX CALCIUM 8.4 G PO PACK: 25.2000 g | PACK | Freq: Every day | ORAL | 0 refills | Status: DC

## 2018-12-03 MED ORDER — DEXTROSE 5 % IV SOLN: INTRAVENOUS | Status: DC

## 2018-12-03 MED ORDER — PATIROMER SORBITEX CALCIUM 8.4 G PO PACK
25.2000 g | PACK | Freq: Every day | ORAL | Status: DC
  Administered 2018-12-03: 25.2 g via ORAL
  Filled 2018-12-03: qty 3

## 2018-12-03 NOTE — Discharge Summary (Addendum)
Physician Discharge Summary  Erin Holt BJY:[id number] DOB: 1950-12-29 DOA: 11/29/2018  PCP: Leeroy Cha, MD  Admit date: 11/29/2018 Discharge date: 12/03/2018  Admitted From: home Disposition:  Home  Recommendations for Outpatient Follow-up:  1. Follow up with GI on 12/04/2018 2. Please obtain BMP/CBC 3. She will need a lower dose of Aldactone as an outpatient. 4. If her potassium is better we can stop the [facility] as an outpatient.  Home Health:No Equipment/Devices:None  Discharge Condition:stable CODE STATUS:full Diet recommendation: Heart Healthy  Brief/Interim Summary: 68 y.o. female past medical history significant for chronic atrial fibrillation, carotid stenosis CVA, recently diagnosed with liver cirrhosis from December 2019, had 2 paracentesis done started on Aldactone sent to the hospital by GI for evaluation of her hyperkalemia and acute kidney injury.  Her potassium was above 6  Discharge Diagnoses:  Principal Problem:   AKI (acute kidney injury) ([place]) Active Problems:   Hypertension, essential   Permanent atrial fibrillation   Hyperkalemia   Liver cirrhosis ([place]) Acute kidney injury Her last creatinine back in 2017 was less than 1. On admission it was 2.7, likely prerenal in the setting of diuretic ACE inhibitor and Aldactone use. She started on IV fluids her creatinine returned to 1.3. Have her labs check a basic metabolic panel on 0/05/6577 at the gastroenterologist office.  Hyperkalemia: In the setting of acute kidney injury, ARB use and Aldactone use. Her potassium was greater than 6 on admission she was started on oral lactulose and her potassium did trend down. Her potassium was hard to keep down with several doses of Kayexalate she was put on Veltassa which she will take at home. On discharge her potassium is 5.2 she will follow-up with Dr. Therisa Doyne on 12/04/2018 she has been given a prescription of Veltassa which she will continue to take as an  outpatient.  Mildly elevated alkaline phosphatase: In the setting of cirrhosis she is remained asymptomatic.  Paroxysmal atrial fibrillation: Currently rate controlled on diltiazem and Xarelto.  Essential hypertension: Her blood pressure remained stable, her diltiazem and her Lasix will be continued as an outpatient. Her Aldactone and her ARB have been on held this will be resumed as an outpatient.  Discharge Instructions  Discharge Instructions    Diet - low sodium heart healthy   Complete by:  As directed    Increase activity slowly   Complete by:  As directed      Allergies as of 12/03/2018   No Known Allergies     Medication List    STOP taking these medications   losartan 100 MG tablet Commonly known as:  COZAAR   PRESCRIPTION MEDICATION   spironolactone 100 MG tablet Commonly known as:  ALDACTONE     TAKE these medications   allopurinol 100 MG tablet Commonly known as:  ZYLOPRIM Take 100 mg by mouth every evening.   atorvastatin 40 MG tablet Commonly known as:  LIPITOR Take 40 mg by mouth every evening.   CARTIA XT 240 MG 24 hr capsule Generic drug:  diltiazem Take 240 mg by mouth daily.   furosemide 40 MG tablet Commonly known as:  LASIX Take 40 mg by mouth daily.   lactulose 10 GM/15ML solution Commonly known as:  CHRONULAC Take 50 g by mouth daily as needed for mild constipation.   latanoprost 0.005 % ophthalmic solution Commonly known as:  XALATAN Place 1 drop into both eyes at bedtime.   patiromer 8.4 g packet Commonly known as:  VELTASSA Take 3 packets (25.2 g  total) by mouth daily.   SIMBRINZA 1-0.2 % Susp Generic drug:  Brinzolamide-Brimonidine Place 1 drop into both eyes 2 (two) times daily.   XARELTO 20 MG Tabs tablet Generic drug:  rivaroxaban TAKE 1 TABLET (20 MG TOTAL) BY MOUTH DAILY WITH SUPPER. What changed:  See the new instructions.       No Known Allergies  Consultations:  none   Procedures/Studies: US  Renal  Result Date: 12-27-2018 CLINICAL DATA:  Acute renal injury EXAM: RENAL / URINARY TRACT ULTRASOUND COMPLETE COMPARISON:  None. FINDINGS: Right Kidney: Renal measurements: 11.1 x 5.9 x 4.9 cm = volume: 174 mL . Echogenicity within normal limits. No solid mass or hydronephrosis visualized. Cysts arising from the upper and lower pole are identified of the right kidney measuring 2.7 x 2.6 x 2.7 cm in the upper pole and 1.7 x 1.2 x 1.4 cm in the lower pole. Left Kidney: Renal measurements: 8.8 x 4.1 x 4.2 cm = volume: 82.1 mL. Echogenicity within normal limits. No solid mass or hydronephrosis visualized. A simple appearing cyst in the upper pole the right kidney is identified measuring approximately 1 cm in diameter. Bladder: Appears normal for degree of bladder distention. IMPRESSION: Bilateral renal simple appearing cysts. No obstructive uropathy or solid appearing masses. Maintained cortical-medullary distinction within both kidneys. Electronically Signed   By: Ashley Royalty M.D.   On: 12/27/18 00:18    Subjective: No complaints feels great.  Discharge Exam: Vitals:   12/02/18 2103 12/03/18 0437  BP: 123/86 128/75  Pulse: 74 81  Resp: 19 20  Temp: 99.1 F (37.3 C) 98.4 F (36.9 C)  SpO2: 100% 100%   Vitals:   12/02/18 0455 12/02/18 0936 12/02/18 2103 12/03/18 0437  BP: (!) 145/91 131/80 123/86 128/75  Pulse: 78 93 74 81  Resp: 20 18 19 20   Temp: 98.5 F (36.9 C) 98.1 F (36.7 C) 99.1 F (37.3 C) 98.4 F (36.9 C)  TempSrc: Oral Oral Oral Oral  SpO2: 100% 100% 100% 100%  Weight:   84.8 kg   Height:        General: Pt is alert, awake, not in acute distress Cardiovascular: RRR, S1/S2 +, no rubs, no gallops Respiratory: CTA bilaterally, no wheezing, no rhonchi Abdominal: Soft, NT, ND, bowel sounds + Extremities: no edema, no cyanosis    The results of significant diagnostics from this hospitalization (including imaging, microbiology, ancillary and laboratory) are listed  below for reference.     Microbiology: No results found for this or any previous visit (from the past 240 hour(s)).   Labs: BNP (last 3 results) No results for input(s): BNP in the last 8760 hours. Basic Metabolic Panel: Recent Labs  Lab 11/29/18 2255  12/01/18 0439 12/01/18 0915 12/01/18 1455 12/02/18 0728 12/03/18 0459  NA  --    < > 137 140 135 139 139  K  --    < > 6.3* 5.1 5.7* 5.1 5.2*  CL  --    < > 111 112* 107 112* 114*  CO2  --    < > 17* 19* 20* 19* 18*  GLUCOSE  --    < > 113* 136* 97 108* 104*  BUN  --    < > 72* 64* 58* 44* 39*  CREATININE  --    < > 1.83* 1.85* 1.72* 1.30* 1.34*  CALCIUM  --    < > 9.8 10.0 9.5 9.4 8.8*  MG 2.0  --   --   --   --   --   --  PHOS 4.8*  --   --   --   --   --   --    < > = values in this interval not displayed.   Liver Function Tests: Recent Labs  Lab 11/29/18 1952  AST 25  ALT 22  ALKPHOS 143*  BILITOT 0.9  PROT 8.8*  ALBUMIN 4.2   No results for input(s): LIPASE, AMYLASE in the last 168 hours. No results for input(s): AMMONIA in the last 168 hours. CBC: Recent Labs  Lab 11/29/18 1952  WBC 8.9  NEUTROABS 5.0  HGB 14.4  HCT 46.4*  MCV 87.4  PLT 222   Cardiac Enzymes: No results for input(s): CKTOTAL, CKMB, CKMBINDEX, TROPONINI in the last 168 hours. BNP: Invalid input(s): POCBNP CBG: No results for input(s): GLUCAP in the last 168 hours. D-Dimer No results for input(s): DDIMER in the last 72 hours. Hgb A1c No results for input(s): HGBA1C in the last 72 hours. Lipid Profile No results for input(s): CHOL, HDL, LDLCALC, TRIG, CHOLHDL, LDLDIRECT in the last 72 hours. Thyroid function studies No results for input(s): TSH, T4TOTAL, T3FREE, THYROIDAB in the last 72 hours.  Invalid input(s): FREET3 Anemia work up No results for input(s): VITAMINB12, FOLATE, FERRITIN, TIBC, IRON, RETICCTPCT in the last 72 hours. Urinalysis    Component Value Date/Time   COLORURINE YELLOW 11/29/2018 2315   APPEARANCEUR  CLEAR 11/29/2018 2315   LABSPEC 1.012 11/29/2018 2315   PHURINE 5.0 11/29/2018 2315   GLUCOSEU NEGATIVE 11/29/2018 2315   HGBUR NEGATIVE 11/29/2018 2315   BILIRUBINUR NEGATIVE 11/29/2018 2315   KETONESUR NEGATIVE 11/29/2018 2315   PROTEINUR NEGATIVE 11/29/2018 2315   NITRITE NEGATIVE 11/29/2018 2315   LEUKOCYTESUR NEGATIVE 11/29/2018 2315   Sepsis Labs Invalid input(s): PROCALCITONIN,  WBC,  LACTICIDVEN Microbiology No results found for this or any previous visit (from the past 240 hour(s)).   Time coordinating discharge: Over 30 minutes  SIGNED:   Charlynne Cousins, MD  [facility] 12/03/2018, 8:50 AM Pager   If 7PM-7AM, please contact night-coverage [email or web address] Password [email or web address]

## 2018-12-03 NOTE — Care Management Important Message (Signed)
Important Message  Patient Details  Name: Erin Holt MRN: [id number] Date of Birth: 21-Feb-1951   Medicare Important Message Given:  Yes    Orbie Pyo 12/03/2018, 11:57 AM

## 2018-12-03 NOTE — Care Management Note (Signed)
Case Management Note Manya Silvas, RN MSN CCM Transitions of Care [address] [place] [phone]  Patient Details  Name: Erin Holt MRN: [id number] Date of Birth: November 09, 1950  Subjective/Objective:             AKI       Action/Plan: PTA home alone. Independent. Still working. Drives self for transportation. States she will call a cab when ready to transition home. No HH or DME needs. No issues with obtaining or self-administering medications. Pt asked about creatinine monitoring-significance of creatinine discussed briefly. Patient is hopeful to transition home tomorrow stating she has church duties pending on Sunday. No transition of care needs identified at this time.   Expected Discharge Date:  12/03/18               Expected Discharge Plan:  Home/Self Care  In-House Referral:  NA  Discharge planning Services  CM Consult  Post Acute Care Choice:  NA Choice offered to:  NA  DME Arranged:  N/A DME Agency:  NA  HH Arranged:  NA HH Agency:  NA  Status of Service:  Completed, signed off  If discussed at [place] of Stay Meetings, dates discussed:    Additional Comments: 12/03/2018-Pt to transition home today. Spoke with pt at bedside. She stated that she has follow up appointments with her GI and cardiologists. Encouraged to call PCP to schedule f/u. Pt to call cab for discharge. No transition of care needs identified.   Bartholomew Crews, RN 12/03/2018, 9:58 AM

## 2018-12-03 NOTE — Progress Notes (Signed)
Nutrition Education Note  RD consulted for nutrition education regarding low sodium diet.  RD provided "Low Sodium Nutrition Therapy" handout from the Academy of [employer]. Reviewed patient's dietary recall. Provided examples on ways to decrease sodium intake in diet. Discouraged intake of processed foods and use of salt shaker. Encouraged fresh fruits and vegetables as well as whole grain sources of carbohydrates to maximize fiber intake.   RD discussed why it is important for patient to adhere to diet recommendations, and emphasized the role of fluids, foods to avoid, and importance of weighing self daily. Teach back method used.  Expect good compliance.  Body mass index is 31.59 kg/m. Pt meets criteria for obese based on current BMI.  Current diet order is Heart, patient is consuming approximately 100% of meals at this time. Labs and medications reviewed. No further nutrition interventions warranted at this time. RD contact information provided. If additional nutrition issues arise, please re-consult RD.   Althea Grimmer, MS, RDN, LDN Pager: [phone] Available Mondays and Fridays, 9am-2pm

## 2018-12-04 DIAGNOSIS — E875 Hyperkalemia: Secondary | ICD-10-CM | POA: Diagnosis not present

## 2018-12-04 DIAGNOSIS — N289 Disorder of kidney and ureter, unspecified: Secondary | ICD-10-CM | POA: Diagnosis not present

## 2018-12-04 DIAGNOSIS — R188 Other ascites: Secondary | ICD-10-CM | POA: Diagnosis not present

## 2018-12-04 DIAGNOSIS — K7469 Other cirrhosis of liver: Secondary | ICD-10-CM | POA: Diagnosis not present

## 2018-12-07 NOTE — Consult Note (Signed)
            [facility] CM Primary Care Navigator  12/07/2018  Erin Holt 1951-09-05 [id number]   Attempt to seepatient at the bedside to identify possible discharge needs butshe wasalreadydischargedhome.  Per MD note,patient was sent to the hospital by GI for evaluation of her hyperkalemia and acute kidney injury with potassium above 6. Inpatient dietitian had also followed patient during this admission  Patientwas noted with instruction to follow-up withprimary care providerpost hospitalization; follow-up with GI on 12/04/18.  Primary care provider's office is listed as providing transition of care (TOC) follow-up.    For additional questions please contact:  Edwena Felty A. Willer [place], BSN, RN-BC [facility] PRIMARY CARE Navigator Cell: [phone]

## 2018-12-10 DIAGNOSIS — N289 Disorder of kidney and ureter, unspecified: Secondary | ICD-10-CM | POA: Diagnosis not present

## 2018-12-10 DIAGNOSIS — K7469 Other cirrhosis of liver: Secondary | ICD-10-CM | POA: Diagnosis not present

## 2018-12-10 DIAGNOSIS — E875 Hyperkalemia: Secondary | ICD-10-CM | POA: Diagnosis not present

## 2018-12-10 DIAGNOSIS — R188 Other ascites: Secondary | ICD-10-CM | POA: Diagnosis not present

## 2018-12-11 DIAGNOSIS — Z8619 Personal history of other infectious and parasitic diseases: Secondary | ICD-10-CM | POA: Diagnosis not present

## 2018-12-11 DIAGNOSIS — I10 Essential (primary) hypertension: Secondary | ICD-10-CM | POA: Diagnosis not present

## 2018-12-11 DIAGNOSIS — E875 Hyperkalemia: Secondary | ICD-10-CM | POA: Diagnosis not present

## 2018-12-11 DIAGNOSIS — K7460 Unspecified cirrhosis of liver: Secondary | ICD-10-CM | POA: Diagnosis not present

## 2018-12-11 DIAGNOSIS — M109 Gout, unspecified: Secondary | ICD-10-CM | POA: Diagnosis not present

## 2018-12-11 DIAGNOSIS — R634 Abnormal weight loss: Secondary | ICD-10-CM | POA: Diagnosis not present

## 2018-12-11 DIAGNOSIS — N179 Acute kidney failure, unspecified: Secondary | ICD-10-CM | POA: Diagnosis not present

## 2018-12-11 DIAGNOSIS — I6529 Occlusion and stenosis of unspecified carotid artery: Secondary | ICD-10-CM | POA: Diagnosis not present

## 2018-12-11 DIAGNOSIS — R188 Other ascites: Secondary | ICD-10-CM | POA: Diagnosis not present

## 2018-12-13 ENCOUNTER — Telehealth: Payer: Self-pay | Admitting: Cardiology

## 2018-12-13 NOTE — Telephone Encounter (Signed)
Received records from [place] on 12/13/18, Appt 02/14/19@ 4:00PM.[place]

## 2018-12-20 DIAGNOSIS — N289 Disorder of kidney and ureter, unspecified: Secondary | ICD-10-CM | POA: Diagnosis not present

## 2018-12-20 DIAGNOSIS — R188 Other ascites: Secondary | ICD-10-CM | POA: Diagnosis not present

## 2018-12-20 DIAGNOSIS — K7469 Other cirrhosis of liver: Secondary | ICD-10-CM | POA: Diagnosis not present

## 2018-12-25 DIAGNOSIS — R040 Epistaxis: Secondary | ICD-10-CM | POA: Diagnosis not present

## 2018-12-31 ENCOUNTER — Other Ambulatory Visit: Payer: Self-pay

## 2018-12-31 MED ORDER — RIVAROXABAN 20 MG PO TABS: 20.0000 mg | ORAL_TABLET | Freq: Every day | ORAL | 1 refills | Status: DC

## 2019-01-15 DIAGNOSIS — H04123 Dry eye syndrome of bilateral lacrimal glands: Secondary | ICD-10-CM | POA: Diagnosis not present

## 2019-01-15 DIAGNOSIS — H401131 Primary open-angle glaucoma, bilateral, mild stage: Secondary | ICD-10-CM | POA: Diagnosis not present

## 2019-01-16 ENCOUNTER — Ambulatory Visit (HOSPITAL_COMMUNITY)
Admission: RE | Admit: 2019-01-16 | Discharge: 2019-01-16 | Disposition: A | Discharge status: Home / Self Care | Payer: Medicare Other | Source: Ambulatory Visit | Attending: Internal Medicine | Admitting: Internal Medicine

## 2019-01-16 ENCOUNTER — Other Ambulatory Visit: Payer: Self-pay

## 2019-01-16 DIAGNOSIS — I6523 Occlusion and stenosis of bilateral carotid arteries: Secondary | ICD-10-CM | POA: Insufficient documentation

## 2019-01-24 DIAGNOSIS — N289 Disorder of kidney and ureter, unspecified: Secondary | ICD-10-CM | POA: Diagnosis not present

## 2019-01-29 ENCOUNTER — Telehealth: Payer: Self-pay

## 2019-01-29 NOTE — Telephone Encounter (Signed)
Virtual Visit Pre-Appointment Phone Call  Steps For Call:  1. Confirm consent - "In the setting of the current Covid19 crisis, you are scheduled for a (phone or video) visit with your provider on (date) at (time).  Just as we do with many in-office visits, in order for you to participate in this visit, we must obtain consent.  If you'd like, I can send this to your mychart (if signed up) or email for you to review.  Otherwise, I can obtain your verbal consent now.  All virtual visits are billed to your insurance company just like a normal visit would be.  By agreeing to a virtual visit, we'd like you to understand that the technology does not allow for your provider to perform an examination, and thus may limit your provider's ability to fully assess your condition.  Finally, though the technology is pretty good, we cannot assure that it will always work on either your or our end, and in the setting of a video visit, we may have to convert it to a phone-only visit.  In either situation, we cannot ensure that we have a secure connection.  Are you willing to proceed?"  2. Give patient instructions for WebEx download to smartphone as below if video visit  3. Advise patient to be prepared with any vital sign or heart rhythm information, their current medicines, and a piece of paper and pen handy for any instructions they may receive the day of their visit  4. Inform patient they will receive a phone call 15 minutes prior to their appointment time (may be from unknown caller ID) so they should be prepared to answer  5. Confirm that appointment type is correct in Epic appointment notes (video vs telephone)    TELEPHONE CALL NOTE  Erin Holt has been deemed a candidate for a follow-up tele-health visit to limit community exposure during the Covid-19 pandemic. I spoke with the patient via phone to ensure availability of phone/video source, confirm preferred email & phone number, and discuss  instructions and expectations.  I reminded Erin Holt to be prepared with any vital sign and/or heart rhythm information that could potentially be obtained via home monitoring, at the time of her visit. I reminded Erin Holt to expect a phone call at the time of her visit if her visit.  Did the patient verbally acknowledge consent to treatment? Patient provided verbal consent.  Erin Holt, [place] 01/29/2019 3:24 PM   DOWNLOADING THE [place]  - If Apple, go to [employer] and type in WebEx in the search bar. [place] [employer], the blue/green circle. The app is free but as with any other app downloads, their phone may require them to verify saved payment information or Apple password. The patient does NOT have to create an account.  - If Android, ask patient to go to [employer] and type in WebEx in the search bar. [place] [employer], the blue/green circle. The app is free but as with any other app downloads, their phone may require them to verify saved payment information or Android password. The patient does NOT have to create an account.   CONSENT FOR TELE-HEALTH VISIT - PLEASE REVIEW  I hereby voluntarily request, consent and authorize [facility] and its employed or contracted physicians, physician assistants, nurse practitioners or other licensed health care professionals (the Practitioner), to provide me with telemedicine health care services (the "Services") as deemed necessary by  the treating Practitioner. I acknowledge and consent to receive the Services by the Practitioner via telemedicine. I understand that the telemedicine visit will involve communicating with the Practitioner through live audiovisual communication technology and the disclosure of certain medical information by electronic transmission. I acknowledge that I have been given the opportunity to request an in-person assessment or other available alternative  prior to the telemedicine visit and am voluntarily participating in the telemedicine visit.  I understand that I have the right to withhold or withdraw my consent to the use of telemedicine in the course of my care at any time, without affecting my right to future care or treatment, and that the Practitioner or I may terminate the telemedicine visit at any time. I understand that I have the right to inspect all information obtained and/or recorded in the course of the telemedicine visit and may receive copies of available information for a reasonable fee.  I understand that some of the potential risks of receiving the Services via telemedicine include:  Marland Kitchen Delay or interruption in medical evaluation due to technological equipment failure or disruption; . Information transmitted may not be sufficient (e.g. poor resolution of images) to allow for appropriate medical decision making by the Practitioner; and/or  . In rare instances, security protocols could fail, causing a breach of personal health information.  Furthermore, I acknowledge that it is my responsibility to provide information about my medical history, conditions and care that is complete and accurate to the best of my ability. I acknowledge that Practitioner's advice, recommendations, and/or decision may be based on factors not within their control, such as incomplete or inaccurate data provided by me or distortions of diagnostic images or specimens that may result from electronic transmissions. I understand that the practice of medicine is not an exact science and that Practitioner makes no warranties or guarantees regarding treatment outcomes. I acknowledge that I will receive a copy of this consent concurrently upon execution via email to the email address I last provided but may also request a printed copy by calling the office of [facility].    I understand that my insurance will be billed for this visit.   I have read or had this  consent read to me. . I understand the contents of this consent, which adequately explains the benefits and risks of the Services being provided via telemedicine.  . I have been provided ample opportunity to ask questions regarding this consent and the Services and have had my questions answered to my satisfaction. . I give my informed consent for the services to be provided through the use of telemedicine in my medical care  By participating in this telemedicine visit I agree to the above.

## 2019-01-30 ENCOUNTER — Other Ambulatory Visit: Payer: Self-pay

## 2019-01-30 DIAGNOSIS — I10 Essential (primary) hypertension: Secondary | ICD-10-CM | POA: Diagnosis not present

## 2019-01-30 DIAGNOSIS — R188 Other ascites: Secondary | ICD-10-CM | POA: Diagnosis not present

## 2019-01-30 DIAGNOSIS — Z8619 Personal history of other infectious and parasitic diseases: Secondary | ICD-10-CM | POA: Diagnosis not present

## 2019-01-30 DIAGNOSIS — I6529 Occlusion and stenosis of unspecified carotid artery: Secondary | ICD-10-CM | POA: Diagnosis not present

## 2019-01-30 DIAGNOSIS — Z86010 Personal history of colonic polyps: Secondary | ICD-10-CM | POA: Diagnosis not present

## 2019-01-30 DIAGNOSIS — K7460 Unspecified cirrhosis of liver: Secondary | ICD-10-CM | POA: Diagnosis not present

## 2019-01-30 DIAGNOSIS — Z0000 Encounter for general adult medical examination without abnormal findings: Secondary | ICD-10-CM | POA: Diagnosis not present

## 2019-01-30 DIAGNOSIS — E875 Hyperkalemia: Secondary | ICD-10-CM | POA: Diagnosis not present

## 2019-01-30 DIAGNOSIS — M109 Gout, unspecified: Secondary | ICD-10-CM | POA: Diagnosis not present

## 2019-01-31 ENCOUNTER — Encounter: Payer: Self-pay | Admitting: Cardiology

## 2019-01-31 ENCOUNTER — Telehealth (INDEPENDENT_AMBULATORY_CARE_PROVIDER_SITE_OTHER): Payer: Medicare Other | Admitting: Cardiology

## 2019-01-31 VITALS — BP 127/82 | HR 66 | Ht 64.5 in | Wt 191.0 lb

## 2019-01-31 DIAGNOSIS — I6529 Occlusion and stenosis of unspecified carotid artery: Secondary | ICD-10-CM | POA: Diagnosis not present

## 2019-01-31 DIAGNOSIS — R188 Other ascites: Secondary | ICD-10-CM

## 2019-01-31 DIAGNOSIS — I272 Pulmonary hypertension, unspecified: Secondary | ICD-10-CM

## 2019-01-31 DIAGNOSIS — I10 Essential (primary) hypertension: Secondary | ICD-10-CM

## 2019-01-31 DIAGNOSIS — M109 Gout, unspecified: Secondary | ICD-10-CM | POA: Diagnosis not present

## 2019-01-31 NOTE — Patient Instructions (Signed)
Medication Instructions:  Continue current medications  If you need a refill on your cardiac medications before your next appointment, please call your pharmacy.  Labwork: None Ordered   Testing/Procedures: None Ordered  Follow-Up: Your physician recommends that you schedule a follow-up appointment in: September 2020   At [facility], you and your health needs are our priority.  As part of our continuing mission to provide you with exceptional heart care, we have created designated Provider Care Teams.  These Care Teams include your primary Cardiologist (physician) and Advanced Practice Providers (APPs -  Physician Assistants and Nurse Practitioners) who all work together to provide you with the care you need, when you need it.  Thank you for choosing [facility] at [facility]!!

## 2019-01-31 NOTE — Progress Notes (Signed)
Virtual Visit via Telephone Note    Evaluation Performed:  Follow-up visit  This visit type was conducted due to national recommendations for restrictions regarding the COVID-19 Pandemic (e.g. social distancing).  This format is felt to be most appropriate for this patient at this time.  All issues noted in this document were discussed and addressed.  No physical exam was performed (except for noted visual exam findings with Video Visits).  Please refer to the patient's chart (MyChart message for video visits and phone note for telephone visits) for the patient's consent to telehealth for [facility].  Date:  01/31/2019   ID:  Erin Holt, DOB [birth year], MRN [id number]  Patient Location:  Home  Provider location:   [place], [place]  PCP:  Leeroy Cha, MD  Cardiologist:  Minus Breeding, MD  Electrophysiologist:  None   Chief Complaint:    History of Present Illness:    Erin Holt is a 68 y.o. female who presents via audio/video conferencing for a telehealth visit today.      The patient presents for follow up of atrial fib.   She was going to have cardioversion but actually went back into sinus rhythm on her own.   Later on follow up she was in atrial fib but without symptoms.  Holter demonstrated atrial fib with controlled rate.  We opted for rate control and anticoagulation.  She has also had a positive sleep study.  She is using CPAP.  She feels better with this and sleeps through the night.  Since I last saw her she had to have a paracentesis.   She was told by a doctor recently that her heart was not working at 100%.  Her EF on echo in 2017 was 55%.   However, she has had known TR and moderate pulmonary HTN.   She did have evidence of increased RA pressure of 8 but preserved RV systolic function.    She subsequently was treated for ascites and had to have paracentesis.  Her significant complaint at that time was lower extremity swelling but not dyspnea.  I  did repeat an echocardiogram after the last visit and it actually suggested that the pulmonary pressures were lower than they had been in the RV had only slightly reduced function.  I had been considering right heart cath but decided this would not need to be done given this result.  She is done very well from a volume standpoint actually although she tells me that she was in the hospital and I reviewed these records.  She was in February with elevated creatinine.  She was dehydrated at that point.  Her creatinine was 2.7.  She was taken off spironolactone.  Lasix was reduced to 20 mg daily.  Her ACE inhibitor was stopped.  Since then she is felt quite well.  She denies any cardiovascular symptoms.  She is not having any shortness of breath, PND or orthopnea.  Has had no weight gain or edema.  She did have blood work done recently but I don't have these results.    The patient does not have symptoms concerning for COVID-19 infection (fever, chills, cough, or new shortness of breath).    Prior CV studies:   The following studies were reviewed today:  Hospital records  Past Medical History:  Diagnosis Date  . Arthritis    "left knee" (10/09/2012)  . Atrial fibrillation ([place])   . Carotid artery occlusion    a. Carotid US (12/10/13):  R 60-79%;  L 1-39% - f/u 6 mos  . Glaucoma (increased eye pressure)    "both eyes" (10/09/2012)  . Gout   . Hx of echocardiogram    a. Echo (09/2012):  Mild LVH, EF 55-65%, Gr 1 DD, MAC, mild MR, mild to mod LAE, mild RVE, PASP 44 mmHg  . Hypertension   . Stroke [facility]) 10/09/2012   Past Surgical History:  Procedure Laterality Date  . BREAST BIOPSY  1980's   "left; it wasn't anything" (10/09/2012)  . COLONOSCOPY WITH PROPOFOL N/A 12/12/2016   Procedure: COLONOSCOPY WITH PROPOFOL;  Surgeon: Garlan Fair, MD;  Location: [facility] ENDOSCOPY;  Service: Endoscopy;  Laterality: N/A;  . IR PARACENTESIS  09/25/2018  . IR PARACENTESIS  10/16/2018  . KNEE ARTHROSCOPY W/  DEBRIDEMENT  2011   "left; for arthritis" (10/09/2012)     Current Meds  Medication Sig  . allopurinol (ZYLOPRIM) 100 MG tablet Take 100 mg by mouth every evening.  Marland Kitchen atorvastatin (LIPITOR) 40 MG tablet Take 40 mg by mouth every evening.   . diltiazem (CARTIA XT) 240 MG 24 hr capsule Take 240 mg by mouth daily.  . furosemide (LASIX) 20 MG tablet Take 20 mg by mouth daily.  Marland Kitchen lactulose (CHRONULAC) 10 GM/15ML solution Take 50 g by mouth daily as needed for mild constipation.   Marland Kitchen latanoprost (XALATAN) 0.005 % ophthalmic solution Place 1 drop into both eyes at bedtime.  . rivaroxaban (XARELTO) 20 MG TABS tablet Take 1 tablet (20 mg total) by mouth daily with supper.  Marland Kitchen SIMBRINZA 1-0.2 % SUSP Place 1 drop into both eyes 2 (two) times daily.   . [DISCONTINUED] furosemide (LASIX) 40 MG tablet Take 40 mg by mouth daily.     Allergies:   Patient has no known allergies.   Social History   Tobacco Use  . Smoking status: Never Smoker  . Smokeless tobacco: Never Used  Substance Use Topics  . Alcohol use: No    Alcohol/week: 0.0 standard drinks  . Drug use: No     Family Hx: The patient's family history includes CAD (age of onset: 32) in her father; Diabetes in her father and mother; Heart failure (age of onset: 26) in her mother.  ROS:   Please see the history of present illness.    As stated in the HPI and negative for all other systems.   Labs/Other Tests and Data Reviewed:    Recent Labs: 11/29/2018: ALT 22; Hemoglobin 14.4; Magnesium 2.0; Platelets 222 12/03/2018: BUN 39; Creatinine, Ser 1.34; Potassium 5.2; Sodium 139   Recent Lipid Panel Lab Results  Component Value Date/Time   CHOL 114 10/17/2016 09:57 AM   TRIG 89 10/17/2016 09:57 AM   HDL 49 (L) 10/17/2016 09:57 AM   CHOLHDL 2.3 10/17/2016 09:57 AM   LDLCALC 47 10/17/2016 09:57 AM    Wt Readings from Last 3 Encounters:  01/31/19 191 lb (86.6 kg)  12/02/18 186 lb 15.2 oz (84.8 kg)  11/14/18 186 lb 9.6 oz (84.6 kg)      Objective:    Vital Signs:  BP 127/82   Pulse 66   Ht 5' 4.5" (1.638 m)   Wt 191 lb (86.6 kg)   BMI 32.28 kg/m      ASSESSMENT & PLAN:    ATRIAL FIB:  Ms. HANIFAH ROYSE has a CHA2DS2 - VASc score of 4 with a risk of stroke of 4.  There is a comment that she had good rate control in the hospital.  No change  in therapy.   ASCITES:  This could be related to increased pulmonary pressure.    She has seen gastroenterology and there is no clear other etiology.  However, the pulmonary pressures were not elevated significantly at the last echo.  I will likely repeat this echocardiogram in 6 months or so to follow-up.   HTN:  Her blood pressure is well controlled despite stopping spironolactone and ACE inhibitor.  Should keep an eye on this.  CAROTID STENOSIS:       She had 40 - 59% stenosis on the right and less on the left in March 2019.  I will follow this next year.  CARDIOMYOPATHY:  As above.  Repeat the echo in six months.   SLEEP APNEA   She is having this actively managed.   DYSLIPIDEMIA:  LDL was 36.  She will continue current therapy.   COVID-19 Education: The signs and symptoms of COVID-19 were discussed with the patient and how to seek care for testing (follow up with PCP or arrange E-visit).  The importance of social distancing was discussed today.  Patient Risk:   After full review of this patient's clinical status, I feel that they are at least moderate risk at this time.  Time:   Today, I have spent 21 minutes with the patient with telehealth technology discussing the above and Covid.     Medication Adjustments/Labs and Tests Ordered: Current medicines are reviewed at length with the patient today.  Concerns regarding medicines are outlined above.  Tests Ordered: No orders of the defined types were placed in this encounter.  Medication Changes: No orders of the defined types were placed in this encounter.   Disposition:  Follow up See her in Sept   Signed, Minus Breeding, MD  01/31/2019 12:42 PM    [place]

## 2019-02-14 ENCOUNTER — Ambulatory Visit: Payer: Medicare Other | Admitting: Cardiology

## 2019-03-20 DIAGNOSIS — K7469 Other cirrhosis of liver: Secondary | ICD-10-CM | POA: Diagnosis not present

## 2019-03-20 DIAGNOSIS — Z87898 Personal history of other specified conditions: Secondary | ICD-10-CM | POA: Diagnosis not present

## 2019-03-20 DIAGNOSIS — N289 Disorder of kidney and ureter, unspecified: Secondary | ICD-10-CM | POA: Diagnosis not present

## 2019-03-20 DIAGNOSIS — Z8639 Personal history of other endocrine, nutritional and metabolic disease: Secondary | ICD-10-CM | POA: Diagnosis not present

## 2019-03-21 DIAGNOSIS — K7469 Other cirrhosis of liver: Secondary | ICD-10-CM | POA: Diagnosis not present

## 2019-03-28 DIAGNOSIS — R921 Mammographic calcification found on diagnostic imaging of breast: Secondary | ICD-10-CM | POA: Diagnosis not present

## 2019-04-19 ENCOUNTER — Other Ambulatory Visit (HOSPITAL_COMMUNITY): Payer: Self-pay | Admitting: Cardiology

## 2019-04-19 DIAGNOSIS — I6523 Occlusion and stenosis of bilateral carotid arteries: Secondary | ICD-10-CM

## 2019-04-23 DIAGNOSIS — K7469 Other cirrhosis of liver: Secondary | ICD-10-CM | POA: Diagnosis not present

## 2019-05-01 DIAGNOSIS — I4891 Unspecified atrial fibrillation: Secondary | ICD-10-CM

## 2019-05-01 HISTORY — DX: Unspecified atrial fibrillation: I48.91

## 2019-05-08 ENCOUNTER — Inpatient Hospital Stay (HOSPITAL_COMMUNITY)
Admission: EM | Admit: 2019-05-08 | Discharge: 2019-05-22 | DRG: 377 | Disposition: A | Discharge status: Home / Self Care | Payer: Medicare Other | Attending: Internal Medicine | Admitting: Internal Medicine

## 2019-05-08 ENCOUNTER — Inpatient Hospital Stay (HOSPITAL_COMMUNITY): Payer: Medicare Other

## 2019-05-08 ENCOUNTER — Other Ambulatory Visit: Payer: Self-pay

## 2019-05-08 ENCOUNTER — Encounter (HOSPITAL_COMMUNITY)
Admission: EM | Disposition: A | Discharge status: Home / Self Care | Payer: Self-pay | Source: Home / Self Care | Attending: Internal Medicine

## 2019-05-08 ENCOUNTER — Encounter (HOSPITAL_COMMUNITY): Payer: Self-pay | Admitting: *Deleted

## 2019-05-08 ENCOUNTER — Emergency Department (HOSPITAL_COMMUNITY): Payer: Medicare Other

## 2019-05-08 DIAGNOSIS — D649 Anemia, unspecified: Secondary | ICD-10-CM

## 2019-05-08 DIAGNOSIS — I4821 Permanent atrial fibrillation: Secondary | ICD-10-CM | POA: Diagnosis present

## 2019-05-08 DIAGNOSIS — I4891 Unspecified atrial fibrillation: Secondary | ICD-10-CM | POA: Diagnosis not present

## 2019-05-08 DIAGNOSIS — Z833 Family history of diabetes mellitus: Secondary | ICD-10-CM | POA: Diagnosis not present

## 2019-05-08 DIAGNOSIS — Z7901 Long term (current) use of anticoagulants: Secondary | ICD-10-CM

## 2019-05-08 DIAGNOSIS — Z8673 Personal history of transient ischemic attack (TIA), and cerebral infarction without residual deficits: Secondary | ICD-10-CM

## 2019-05-08 DIAGNOSIS — R0689 Other abnormalities of breathing: Secondary | ICD-10-CM | POA: Diagnosis present

## 2019-05-08 DIAGNOSIS — K625 Hemorrhage of anus and rectum: Secondary | ICD-10-CM | POA: Diagnosis not present

## 2019-05-08 DIAGNOSIS — R7303 Prediabetes: Secondary | ICD-10-CM | POA: Diagnosis present

## 2019-05-08 DIAGNOSIS — N179 Acute kidney failure, unspecified: Secondary | ICD-10-CM | POA: Diagnosis not present

## 2019-05-08 DIAGNOSIS — I82612 Acute embolism and thrombosis of superficial veins of left upper extremity: Secondary | ICD-10-CM | POA: Diagnosis not present

## 2019-05-08 DIAGNOSIS — R600 Localized edema: Secondary | ICD-10-CM | POA: Diagnosis not present

## 2019-05-08 DIAGNOSIS — R188 Other ascites: Secondary | ICD-10-CM | POA: Diagnosis not present

## 2019-05-08 DIAGNOSIS — K7460 Unspecified cirrhosis of liver: Secondary | ICD-10-CM | POA: Diagnosis present

## 2019-05-08 DIAGNOSIS — Z8249 Family history of ischemic heart disease and other diseases of the circulatory system: Secondary | ICD-10-CM

## 2019-05-08 DIAGNOSIS — M7989 Other specified soft tissue disorders: Secondary | ICD-10-CM | POA: Diagnosis not present

## 2019-05-08 DIAGNOSIS — E872 Acidosis: Secondary | ICD-10-CM | POA: Diagnosis present

## 2019-05-08 DIAGNOSIS — Z452 Encounter for adjustment and management of vascular access device: Secondary | ICD-10-CM

## 2019-05-08 DIAGNOSIS — L03114 Cellulitis of left upper limb: Secondary | ICD-10-CM | POA: Diagnosis not present

## 2019-05-08 DIAGNOSIS — R578 Other shock: Secondary | ICD-10-CM | POA: Diagnosis not present

## 2019-05-08 DIAGNOSIS — D62 Acute posthemorrhagic anemia: Secondary | ICD-10-CM | POA: Diagnosis not present

## 2019-05-08 DIAGNOSIS — I10 Essential (primary) hypertension: Secondary | ICD-10-CM | POA: Diagnosis present

## 2019-05-08 DIAGNOSIS — H409 Unspecified glaucoma: Secondary | ICD-10-CM | POA: Diagnosis present

## 2019-05-08 DIAGNOSIS — I499 Cardiac arrhythmia, unspecified: Secondary | ICD-10-CM | POA: Diagnosis not present

## 2019-05-08 DIAGNOSIS — Z6835 Body mass index (BMI) 35.0-35.9, adult: Secondary | ICD-10-CM | POA: Diagnosis not present

## 2019-05-08 DIAGNOSIS — K922 Gastrointestinal hemorrhage, unspecified: Secondary | ICD-10-CM | POA: Diagnosis present

## 2019-05-08 DIAGNOSIS — R0602 Shortness of breath: Secondary | ICD-10-CM

## 2019-05-08 DIAGNOSIS — J9601 Acute respiratory failure with hypoxia: Secondary | ICD-10-CM | POA: Diagnosis not present

## 2019-05-08 DIAGNOSIS — R238 Other skin changes: Secondary | ICD-10-CM | POA: Diagnosis not present

## 2019-05-08 DIAGNOSIS — R000 Tachycardia, unspecified: Secondary | ICD-10-CM | POA: Diagnosis not present

## 2019-05-08 DIAGNOSIS — K254 Chronic or unspecified gastric ulcer with hemorrhage: Secondary | ICD-10-CM | POA: Diagnosis not present

## 2019-05-08 DIAGNOSIS — R579 Shock, unspecified: Secondary | ICD-10-CM | POA: Diagnosis not present

## 2019-05-08 DIAGNOSIS — K7581 Nonalcoholic steatohepatitis (NASH): Secondary | ICD-10-CM | POA: Diagnosis present

## 2019-05-08 DIAGNOSIS — K921 Melena: Secondary | ICD-10-CM | POA: Diagnosis not present

## 2019-05-08 DIAGNOSIS — Z20828 Contact with and (suspected) exposure to other viral communicable diseases: Secondary | ICD-10-CM | POA: Diagnosis present

## 2019-05-08 DIAGNOSIS — Z01818 Encounter for other preprocedural examination: Secondary | ICD-10-CM

## 2019-05-08 DIAGNOSIS — K319 Disease of stomach and duodenum, unspecified: Secondary | ICD-10-CM | POA: Diagnosis present

## 2019-05-08 DIAGNOSIS — E876 Hypokalemia: Secondary | ICD-10-CM | POA: Diagnosis not present

## 2019-05-08 DIAGNOSIS — E663 Overweight: Secondary | ICD-10-CM | POA: Diagnosis present

## 2019-05-08 DIAGNOSIS — M109 Gout, unspecified: Secondary | ICD-10-CM | POA: Diagnosis present

## 2019-05-08 DIAGNOSIS — K3189 Other diseases of stomach and duodenum: Secondary | ICD-10-CM | POA: Diagnosis not present

## 2019-05-08 DIAGNOSIS — Z4682 Encounter for fitting and adjustment of non-vascular catheter: Secondary | ICD-10-CM | POA: Diagnosis not present

## 2019-05-08 DIAGNOSIS — R0902 Hypoxemia: Secondary | ICD-10-CM | POA: Diagnosis not present

## 2019-05-08 HISTORY — PX: HEMOSTASIS CLIP PLACEMENT: SHX6857

## 2019-05-08 HISTORY — PX: ESOPHAGOGASTRODUODENOSCOPY (EGD) WITH PROPOFOL: SHX5813

## 2019-05-08 LAB — CBC WITH DIFFERENTIAL/PLATELET
Abs Immature Granulocytes: 0.09 10*3/uL — ABNORMAL HIGH (ref 0.00–0.07)
Basophils Absolute: 0 10*3/uL (ref 0.0–0.1)
Basophils Relative: 0 %
Eosinophils Absolute: 0 10*3/uL (ref 0.0–0.5)
Eosinophils Relative: 0 %
HCT: 17.7 % — ABNORMAL LOW (ref 36.0–46.0)
Hemoglobin: 4.9 g/dL — CL (ref 12.0–15.0)
Immature Granulocytes: 1 %
Lymphocytes Relative: 15 %
Lymphs Abs: 2 10*3/uL (ref 0.7–4.0)
MCH: 26.5 pg (ref 26.0–34.0)
MCHC: 27.7 g/dL — ABNORMAL LOW (ref 30.0–36.0)
MCV: 95.7 fL (ref 80.0–100.0)
Monocytes Absolute: 0.4 10*3/uL (ref 0.1–1.0)
Monocytes Relative: 3 %
Neutro Abs: 11.4 10*3/uL — ABNORMAL HIGH (ref 1.7–7.7)
Neutrophils Relative %: 81 %
Platelets: 360 10*3/uL (ref 150–400)
RBC: 1.85 MIL/uL — ABNORMAL LOW (ref 3.87–5.11)
RDW: 14.1 % (ref 11.5–15.5)
WBC: 14 10*3/uL — ABNORMAL HIGH (ref 4.0–10.5)
nRBC: 0.6 % — ABNORMAL HIGH (ref 0.0–0.2)

## 2019-05-08 LAB — COMPREHENSIVE METABOLIC PANEL
ALT: 14 U/L (ref 0–44)
AST: 29 U/L (ref 15–41)
Albumin: 3.1 g/dL — ABNORMAL LOW (ref 3.5–5.0)
Alkaline Phosphatase: 93 U/L (ref 38–126)
Anion gap: 17 — ABNORMAL HIGH (ref 5–15)
BUN: 37 mg/dL — ABNORMAL HIGH (ref 8–23)
CO2: 10 mmol/L — ABNORMAL LOW (ref 22–32)
Calcium: 8.9 mg/dL (ref 8.9–10.3)
Chloride: 117 mmol/L — ABNORMAL HIGH (ref 98–111)
Creatinine, Ser: 1.67 mg/dL — ABNORMAL HIGH (ref 0.44–1.00)
GFR calc Af Amer: 36 mL/min — ABNORMAL LOW (ref >60)
GFR calc non Af Amer: 31 mL/min — ABNORMAL LOW (ref >60)
Glucose, Bld: 182 mg/dL — ABNORMAL HIGH (ref 70–99)
Potassium: 4.5 mmol/L (ref 3.5–5.1)
Sodium: 144 mmol/L (ref 135–145)
Total Bilirubin: 0.6 mg/dL (ref 0.3–1.2)
Total Protein: 6.6 g/dL (ref 6.5–8.1)

## 2019-05-08 LAB — BLOOD GAS, ARTERIAL
Bicarbonate: 5.7 mmol/L — ABNORMAL LOW (ref 20.0–28.0)
Drawn by: 345601
O2 Content: 4 L/min
O2 Saturation: 99.3 %
Patient temperature: 97.7
pH, Arterial: 7.401 (ref 7.350–7.450)
pO2, Arterial: 196 mmHg — ABNORMAL HIGH (ref 83.0–108.0)

## 2019-05-08 LAB — CBG MONITORING, ED: Glucose-Capillary: 166 mg/dL — ABNORMAL HIGH (ref 70–99)

## 2019-05-08 LAB — LACTIC ACID, PLASMA: Lactic Acid, Venous: 10.9 mmol/L (ref 0.5–1.9)

## 2019-05-08 LAB — MASSIVE TRANSFUSION PROTOCOL ORDER (BLOOD BANK NOTIFICATION)

## 2019-05-08 LAB — PREPARE RBC (CROSSMATCH)

## 2019-05-08 LAB — GLUCOSE, CAPILLARY: Glucose-Capillary: 128 mg/dL — ABNORMAL HIGH (ref 70–99)

## 2019-05-08 LAB — PROTIME-INR
INR: 1.9 — ABNORMAL HIGH (ref 0.8–1.2)
INR: 1.6 — ABNORMAL HIGH (ref 0.8–1.2)
Prothrombin Time: 21.5 seconds — ABNORMAL HIGH (ref 11.4–15.2)
Prothrombin Time: 19 seconds — ABNORMAL HIGH (ref 11.4–15.2)

## 2019-05-08 LAB — ABO/RH: ABO/RH(D): O POS

## 2019-05-08 LAB — SARS CORONAVIRUS 2 BY RT PCR (HOSPITAL ORDER, PERFORMED IN ~~LOC~~ HOSPITAL LAB): SARS Coronavirus 2: NEGATIVE

## 2019-05-08 SURGERY — ESOPHAGOGASTRODUODENOSCOPY (EGD) WITH PROPOFOL
Anesthesia: Moderate Sedation

## 2019-05-08 MED ORDER — SODIUM CHLORIDE 0.9 % IV SOLN
8.0000 mg/h | INTRAVENOUS | Status: DC
  Filled 2019-05-08: qty 80

## 2019-05-08 MED ORDER — MIDAZOLAM HCL (PF) 5 MG/ML IJ SOLN
INTRAMUSCULAR | Status: AC
  Filled 2019-05-08: qty 2

## 2019-05-08 MED ORDER — FENTANYL CITRATE (PF) 100 MCG/2ML IJ SOLN
100.0000 ug | Freq: Once | INTRAMUSCULAR | Status: AC
  Administered 2019-05-08: 50 ug via INTRAVENOUS

## 2019-05-08 MED ORDER — PANTOPRAZOLE SODIUM 40 MG IV SOLR: 40.0000 mg | Freq: Two times a day (BID) | INTRAVENOUS | Status: DC

## 2019-05-08 MED ORDER — BRIMONIDINE TARTRATE 0.2 % OP SOLN
1.0000 [drp] | Freq: Two times a day (BID) | OPHTHALMIC | Status: DC
  Administered 2019-05-09 – 2019-05-13 (×8): 1 [drp] via OPHTHALMIC
  Filled 2019-05-08 (×2): qty 5

## 2019-05-08 MED ORDER — LACTATED RINGERS IV BOLUS: 1000.0000 mL | Freq: Once | INTRAVENOUS | Status: DC

## 2019-05-08 MED ORDER — SODIUM BICARBONATE 8.4 % IV SOLN
150.0000 meq | Freq: Once | INTRAVENOUS | Status: AC
  Administered 2019-05-08: 150 meq via INTRAVENOUS

## 2019-05-08 MED ORDER — LATANOPROST 0.005 % OP SOLN
1.0000 [drp] | Freq: Every day | OPHTHALMIC | Status: DC
  Administered 2019-05-09 – 2019-05-21 (×13): 1 [drp] via OPHTHALMIC
  Filled 2019-05-08 (×2): qty 2.5

## 2019-05-08 MED ORDER — SODIUM CHLORIDE 0.9 % IV SOLN
50.0000 ug/h | INTRAVENOUS | Status: DC
  Filled 2019-05-08: qty 1

## 2019-05-08 MED ORDER — SODIUM BICARBONATE 8.4 % IV SOLN: 150.0000 meq | Freq: Once | INTRAVENOUS | Status: AC

## 2019-05-08 MED ORDER — PHENYLEPHRINE HCL-NACL 10-0.9 MG/250ML-% IV SOLN
INTRAVENOUS | Status: AC
  Filled 2019-05-08: qty 250

## 2019-05-08 MED ORDER — ROCURONIUM BROMIDE 50 MG/5ML IV SOLN
90.0000 mg | Freq: Once | INTRAVENOUS | Status: AC
  Administered 2019-05-08: 90 mg via INTRAVENOUS
  Filled 2019-05-08: qty 9

## 2019-05-08 MED ORDER — VITAMIN K1 10 MG/ML IJ SOLN
10.0000 mg | Freq: Once | INTRAVENOUS | Status: AC
  Administered 2019-05-09: 01:00:00 10 mg via INTRAVENOUS
  Filled 2019-05-08: qty 1

## 2019-05-08 MED ORDER — FENTANYL CITRATE (PF) 100 MCG/2ML IJ SOLN
INTRAMUSCULAR | Status: AC
  Filled 2019-05-08: qty 2

## 2019-05-08 MED ORDER — PROTHROMBIN COMPLEX CONC HUMAN 500 UNITS IV KIT
4957.0000 [IU] | PACK | Status: AC
  Administered 2019-05-08: 4957 [IU] via INTRAVENOUS
  Filled 2019-05-08: qty 4457

## 2019-05-08 MED ORDER — PROPOFOL 1000 MG/100ML IV EMUL
INTRAVENOUS | Status: AC
  Filled 2019-05-08: qty 100

## 2019-05-08 MED ORDER — ONDANSETRON HCL 4 MG PO TABS
4.0000 mg | ORAL_TABLET | Freq: Four times a day (QID) | ORAL | Status: DC | PRN
  Administered 2019-05-19: 4 mg via ORAL
  Filled 2019-05-08: qty 1

## 2019-05-08 MED ORDER — SODIUM CHLORIDE 0.9% IV SOLUTION: Freq: Once | INTRAVENOUS | Status: DC

## 2019-05-08 MED ORDER — ACETAMINOPHEN 325 MG PO TABS
650.0000 mg | ORAL_TABLET | Freq: Four times a day (QID) | ORAL | Status: DC | PRN
  Administered 2019-05-12: 20:00:00 650 mg via ORAL
  Filled 2019-05-08 (×2): qty 2

## 2019-05-08 MED ORDER — SODIUM CHLORIDE 0.9 % IV BOLUS: 1000.0000 mL | Freq: Once | INTRAVENOUS | Status: DC

## 2019-05-08 MED ORDER — ACETAMINOPHEN 650 MG RE SUPP: 650.0000 mg | Freq: Four times a day (QID) | RECTAL | Status: DC | PRN

## 2019-05-08 MED ORDER — FENTANYL CITRATE (PF) 100 MCG/2ML IJ SOLN
INTRAMUSCULAR | Status: AC
  Administered 2019-05-08: 50 ug
  Filled 2019-05-08: qty 2

## 2019-05-08 MED ORDER — SODIUM BICARBONATE 8.4 % IV SOLN
INTRAVENOUS | Status: AC
  Filled 2019-05-08: qty 100

## 2019-05-08 MED ORDER — PROPOFOL 1000 MG/100ML IV EMUL
0.0000 ug/kg/min | INTRAVENOUS | Status: DC
  Administered 2019-05-08: 23:00:00 10 ug/kg/min via INTRAVENOUS
  Administered 2019-05-09 (×2): 20 ug/kg/min via INTRAVENOUS
  Filled 2019-05-08 (×2): qty 100

## 2019-05-08 MED ORDER — BRINZOLAMIDE 1 % OP SUSP
1.0000 [drp] | Freq: Two times a day (BID) | OPHTHALMIC | Status: DC
  Administered 2019-05-09 – 2019-05-21 (×16): 1 [drp] via OPHTHALMIC
  Filled 2019-05-08 (×2): qty 10

## 2019-05-08 MED ORDER — ONDANSETRON HCL 4 MG/2ML IJ SOLN
4.0000 mg | Freq: Four times a day (QID) | INTRAMUSCULAR | Status: DC | PRN
  Filled 2019-05-08: qty 2

## 2019-05-08 MED ORDER — SODIUM BICARBONATE 8.4 % IV SOLN
100.0000 meq | Freq: Once | INTRAVENOUS | Status: AC
  Administered 2019-05-08: 100 meq via INTRAVENOUS

## 2019-05-08 MED ORDER — PHENYLEPHRINE HCL-NACL 10-0.9 MG/250ML-% IV SOLN
0.0000 ug/min | INTRAVENOUS | Status: DC
  Administered 2019-05-08: 200 ug/min via INTRAVENOUS
  Administered 2019-05-08: 100 ug/min via INTRAVENOUS
  Filled 2019-05-08 (×2): qty 250

## 2019-05-08 MED ORDER — STERILE WATER FOR INJECTION IV SOLN
INTRAVENOUS | Status: DC
  Administered 2019-05-08 – 2019-05-09 (×3): via INTRAVENOUS
  Filled 2019-05-08 (×6): qty 850

## 2019-05-08 MED ORDER — SODIUM BICARBONATE 8.4 % IV SOLN
INTRAVENOUS | Status: AC
  Filled 2019-05-08: qty 150

## 2019-05-08 MED ORDER — SODIUM CHLORIDE 0.9% IV SOLUTION
Freq: Once | INTRAVENOUS | Status: AC
  Administered 2019-05-08: 22:00:00 via INTRAVENOUS

## 2019-05-08 MED ORDER — NOREPINEPHRINE 4 MG/250ML-% IV SOLN
0.0000 ug/min | INTRAVENOUS | Status: DC
  Administered 2019-05-08: 21:00:00 4 ug/min via INTRAVENOUS
  Filled 2019-05-08: qty 250

## 2019-05-08 MED ORDER — ETOMIDATE 2 MG/ML IV SOLN
20.0000 mg | Freq: Once | INTRAVENOUS | Status: AC
  Administered 2019-05-08: 20 mg via INTRAVENOUS

## 2019-05-08 MED ORDER — PEG 3350-KCL-NA BICARB-NACL 420 G PO SOLR
4000.0000 mL | Freq: Once | ORAL | Status: AC
  Administered 2019-05-09: 03:00:00 4000 mL via ORAL
  Filled 2019-05-08 (×2): qty 4000

## 2019-05-08 MED ORDER — MIDAZOLAM HCL 2 MG/2ML IJ SOLN
INTRAMUSCULAR | Status: AC
  Administered 2019-05-08: 23:00:00 2 mg via INTRAVENOUS
  Filled 2019-05-08: qty 2

## 2019-05-08 MED ORDER — MIDAZOLAM HCL 2 MG/2ML IJ SOLN
2.0000 mg | Freq: Once | INTRAMUSCULAR | Status: AC
  Administered 2019-05-08: 23:00:00 2 mg via INTRAVENOUS

## 2019-05-08 MED ORDER — SODIUM CHLORIDE 0.9 % IV SOLN
80.0000 mg | Freq: Once | INTRAVENOUS | Status: DC
  Administered 2019-05-09: 01:00:00 80 mg via INTRAVENOUS
  Filled 2019-05-08: qty 80

## 2019-05-08 SURGICAL SUPPLY — 15 items

## 2019-05-08 NOTE — ED Notes (Signed)
Critical care at bedside  

## 2019-05-08 NOTE — ED Notes (Signed)
Dr gardener at bedside

## 2019-05-08 NOTE — ED Notes (Signed)
Order to run  Blood wide open  Per dr gardener

## 2019-05-08 NOTE — Procedures (Signed)
Intubation Procedure Note LARYA CHARPENTIER [id number] Jun 09, 1951  Procedure: Intubation Indications: Airway protection and maintenance  Procedure Details Consent: Risks of procedure as well as the alternatives and risks of each were explained to the (patient/caregiver).  Consent for procedure obtained. Time Out: Verified patient identification, verified procedure, site/side was marked, verified correct patient position, special equipment/implants available, medications/allergies/relevent history reviewed, required imaging and test results available.  Performed  Maximum sterile technique was used including gloves and mask.  MAC and 3 7.5 endotracheal tube was introduced using a glide scope from first attempt    Evaluation Hemodynamic Status: BP stable throughout; O2 sats: stable throughout Patient's Current Condition: stable Complications: No apparent complications Patient did tolerate procedure well. Chest X-ray ordered to verify placement.  CXR: pending.   Estill Batten Z Harriett Azar 05/08/2019

## 2019-05-08 NOTE — ED Triage Notes (Signed)
The pt arrtived by gems from home  C/o sob and rectal; bleeding for 2 days  No pain  Lm,p none

## 2019-05-08 NOTE — Brief Op Note (Signed)
05/08/2019  11:57 PM  PATIENT:  Erin Holt  68 y.o. female  PRE-OPERATIVE DIAGNOSIS:  melena,hematochezia likley variceal bleeding  POST-OPERATIVE DIAGNOSIS:  small gastric erosion  PROCEDURE:  Procedure(s): ESOPHAGOGASTRODUODENOSCOPY (EGD) WITH PROPOFOL (N/A)  SURGEON:  Surgeon(s) and Role:    * Ronnette Juniper, MD - Primary  PHYSICIAN ASSISTANT:   ASSISTANTS: {Karen Hinson, RN, [place], Tech ANESTHESIA:   MAC  EBL:  None  BLOOD ADMINISTERED:none  DRAINS: none   LOCAL MEDICATIONS USED:  NONE  SPECIMEN:  No Specimen  DISPOSITION OF SPECIMEN:  N/A  COUNTS:  YES  TOURNIQUET:  * No tourniquets in log *  DICTATION: .Dragon Dictation  PLAN OF CARE: Admit to inpatient   PATIENT DISPOSITION:  PACU - hemodynamically stable.   Delay start of Pharmacological VTE agent (>24hrs) due to surgical blood loss or risk of bleeding: yes

## 2019-05-08 NOTE — H&P (Signed)
History and Physical    Erin Holt:[id number] DOB: 1950-11-07 DOA: 05/08/2019  PCP: Leeroy Cha, MD  Patient coming from: Home  I have personally briefly reviewed patient's old medical records in [place]  Chief Complaint: GI bleed  HPI: Erin Holt is a 68 y.o. female with medical history significant of NASH cirrhosis just recently diagnosed, hasnt had EGD yet, HTN, A.Fib on Xarelto, prior stroke.  Patient presents to the ED with c/o black stool and dark red blood per rectum for past 5 days or so.  Symptoms constant, nothing makes better or worse.  No abd pain, no vomiting.   ED Course: Initially BP 86/59, now currently 123/106 after 1st unit PRBC mostly transfused.  HGB 4.9 2u PRBC transfusion ordered by EDP, bicarb 10, BUN 37, creat 1.67 (nl back in 2017, was elevated as high as 2.8 earlier this year but that looks like AKI with hospital stay as it trended back down to 1.3 before discharge)   Review of Systems: As per HPI otherwise 10 point review of systems negative.   Past Medical History:  Diagnosis Date  . Arthritis    "left knee" (10/09/2012)  . Atrial fibrillation ([place])   . Carotid artery occlusion    a. Carotid US (12/10/13):  R 60-79%; L 1-39% - f/u 6 mos  . Glaucoma (increased eye pressure)    "both eyes" (10/09/2012)  . Gout   . Hx of echocardiogram    a. Echo (09/2012):  Mild LVH, EF 55-65%, Gr 1 DD, MAC, mild MR, mild to mod LAE, mild RVE, PASP 44 mmHg  . Hypertension   . Stroke [facility]) 10/09/2012    Past Surgical History:  Procedure Laterality Date  . BREAST BIOPSY  1980's   "left; it wasn't anything" (10/09/2012)  . COLONOSCOPY WITH PROPOFOL N/A 12/12/2016   Procedure: COLONOSCOPY WITH PROPOFOL;  Surgeon: Garlan Fair, MD;  Location: [facility];  Service: Endoscopy;  Laterality: N/A;  . IR PARACENTESIS  09/25/2018  . IR PARACENTESIS  10/16/2018  . KNEE ARTHROSCOPY W/ DEBRIDEMENT  2011   "left; for arthritis"  (10/09/2012)     reports that she has never smoked. She has never used smokeless tobacco. She reports that she does not drink alcohol or use drugs.  No Known Allergies  Family History  Problem Relation Age of Onset  . Heart failure Mother 36       Died  . Diabetes Mother   . Diabetes Father   . CAD Father 35     Prior to Admission medications   Medication Sig Start Date End Date Taking? Authorizing Provider  allopurinol (ZYLOPRIM) 100 MG tablet Take 100 mg by mouth every evening.    [provider]  atorvastatin (LIPITOR) 40 MG tablet Take 40 mg by mouth every evening.  05/06/15   [provider]  diltiazem (CARTIA XT) 240 MG 24 hr capsule Take 240 mg by mouth daily.    [provider]  furosemide (LASIX) 20 MG tablet Take 20 mg by mouth daily.    [provider]  lactulose (CHRONULAC) 10 GM/15ML solution Take 50 g by mouth daily as needed for mild constipation.  11/06/18   [provider]  latanoprost (XALATAN) 0.005 % ophthalmic solution Place 1 drop into both eyes at bedtime.    [provider]  rivaroxaban (XARELTO) 20 MG TABS tablet Take 1 tablet (20 mg total) by mouth daily with supper. 12/31/18   Lelon Perla, MD  SIMBRINZA 1-0.2 % SUSP Place 1 drop into both eyes 2 (two) times daily.  03/28/18   [provider]    Physical Exam: Vitals:   05/08/19 1950 05/08/19 1955 05/08/19 1956 05/08/19 2000  BP:    (!) 123/106  Pulse: (!) 58 (!) 131    Resp: (!) 28 (!) 39  (!) 24  Temp:   (!) 97.5 F (36.4 C)   SpO2: 100% 91%    Weight:      Height:        Constitutional: NAD, calm, increased RR Eyes: PERRL, lids and conjunctivae normal ENMT: Mucous membranes are moist. Posterior pharynx clear of any exudate or lesions.Normal dentition.  Neck: normal, supple, no masses, no thyromegaly Respiratory: Tachypnea  Cardiovascular: IRR, IRR, tachycardic Abdomen: no tenderness, no masses palpated. No hepatosplenomegaly.  Bowel sounds positive.  Musculoskeletal: no clubbing / cyanosis. No joint deformity upper and lower extremities. Good ROM, no contractures. Normal muscle tone.  Skin: no rashes, lesions, ulcers. No induration Neurologic: CN 2-12 grossly intact. Sensation intact, DTR normal. Strength 5/5 in all 4.  Psychiatric: Normal judgment and insight. Alert and oriented x 3. Normal mood.    Labs on Admission: I have personally reviewed following labs and imaging studies  CBC: Recent Labs  Lab 05/08/19 1751  WBC 14.0*  NEUTROABS 11.4*  HGB 4.9*  HCT 17.7*  MCV 95.7  PLT 161   Basic Metabolic Panel: Recent Labs  Lab 05/08/19 1751  NA 144  K 4.5  CL 117*  CO2 10*  GLUCOSE 182*  BUN 37*  CREATININE 1.67*  CALCIUM 8.9   GFR: Estimated Creatinine Clearance: 35.8 mL/min (A) (by C-G formula based on SCr of 1.67 mg/dL (H)). Liver Function Tests: Recent Labs  Lab 05/08/19 1751  AST 29  ALT 14  ALKPHOS 93  BILITOT 0.6  PROT 6.6  ALBUMIN 3.1*   No results for input(s): LIPASE, AMYLASE in the last 168 hours. No results for input(s): AMMONIA in the last 168 hours. Coagulation Profile: Recent Labs  Lab 05/08/19 1751  INR 1.9*   Cardiac Enzymes: No results for input(s): CKTOTAL, CKMB, CKMBINDEX, TROPONINI in the last 168 hours. BNP (last 3 results) No results for input(s): PROBNP in the last 8760 hours. HbA1C: No results for input(s): HGBA1C in the last 72 hours. CBG: Recent Labs  Lab 05/08/19 1736  GLUCAP 166*   Lipid Profile: No results for input(s): CHOL, HDL, LDLCALC, TRIG, CHOLHDL, LDLDIRECT in the last 72 hours. Thyroid Function Tests: No results for input(s): TSH, T4TOTAL, FREET4, T3FREE, THYROIDAB in the last 72 hours. Anemia Panel: No results for input(s): VITAMINB12, FOLATE, FERRITIN, TIBC, IRON, RETICCTPCT in the last 72 hours. Urine analysis:    Component Value Date/Time   COLORURINE YELLOW 11/29/2018 2315   APPEARANCEUR CLEAR 11/29/2018 2315   LABSPEC  1.012 11/29/2018 2315   PHURINE 5.0 11/29/2018 2315   GLUCOSEU NEGATIVE 11/29/2018 2315   HGBUR NEGATIVE 11/29/2018 2315   BILIRUBINUR NEGATIVE 11/29/2018 2315   KETONESUR NEGATIVE 11/29/2018 2315   PROTEINUR NEGATIVE 11/29/2018 2315   NITRITE NEGATIVE 11/29/2018 2315   LEUKOCYTESUR NEGATIVE 11/29/2018 2315    Radiological Exams on Admission: No results found.  EKG: Independently reviewed.  Assessment/Plan Principal Problem:   Hemorrhagic shock (HCC) Active Problems:   Permanent atrial fibrillation   AKI (acute kidney injury) ([place])   Liver cirrhosis (HCC)   GI bleed   Acute blood loss anemia    1. GIB with acute blood loss anemia and Hemorrhagic shock -  1. Transfusing 3u PRBC (I added a 3rd unit order) 2. H/H repeat at [facility] and again at 0500 3. K centra ordered to reverse Xarelto emergently 4. Lactic acid pending 5. EDP spoke with GI, on-call doc happens to know patient and be Holt gastroenterologist: 1. Empiric protonix and octreotide since we dont know if she has variceal bleed 2. Get CT abd pelvis (have ordered as CT angio) to try and localize bleed since it sounds like it might be diverticular. 6. Tele monitor and cont pulse ox 7. Pressure back down to 80/40 now, getting 1L NS bolus through a pressure bag. 8. Will then put on bicarb gtt at 75 cc/hr (as well as blood) 9. Depending on CT results: call Dr. Therisa Doyne back (if upper bleed) or call IR (if lower bleed) for emergent intervention 2. AKI - creat 1.67 (nl back in 2017, was elevated as high as 2.8 earlier this year but that looks like AKI with hospital stay as it trended back down to 1.3 before discharge) 1. Presumably secondary to #1 above 2. Volume resuscitation as above 3. H/o HTN - 1. Holding BP meds 4. A.Fib - 1. Didn't take any meds today 2. Though INR 1.9 - got K Centra for emergency reversal 3. HR 120-130 but will hold off on starting any rate control meds due to hypotension, I think she needs the elevated  rate to maintain perfusion in setting of hemorrhagic shock.  At least until we can stabilize blood pressure. 4. No CP per patient 5. Tele monitor  DVT prophylaxis: SCDs Code Status: Full Family Communication: No family in room Disposition Plan: Home after admit Consults called: EDP spoke with Dr. Therisa Doyne Admission status: Admit to inpatient  Severity of Illness: The appropriate patient status for this patient is INPATIENT. Inpatient status is judged to be reasonable and necessary in order to provide the required intensity of service to ensure the patient's safety. The patient's presenting symptoms, physical exam findings, and initial radiographic and laboratory data in the context of their chronic comorbidities is felt to place them at high risk for further clinical deterioration. Furthermore, it is not anticipated that the patient will be medically stable for discharge from the hospital within 2 midnights of admission. The following factors support the patient status of inpatient.   IP status for treatment of GIB with HGB 4.9 with hemorrhagic shock!   * I certify that at the point of admission it is my clinical judgment that the patient will require inpatient hospital care spanning beyond 2 midnights from the point of admission due to high intensity of service, high risk for further deterioration and high frequency of surveillance required.*    Braylen Staller M. DO [facility]  How to contact the [facility] Attending or Consulting provider [place] or covering provider during after hours [place], for this patient?  1. Check the care team in [facility] and look for a) attending/consulting [facility] provider listed and b) the [facility] team listed 2. Log into [email or web address]  Amion Physician Scheduling and messaging for groups and whole hospitals  On call and physician scheduling software for group practices, residents, hospitalists and other medical providers for call, clinic, rotation and shift schedules. OnCall  [employer] is a hospital-wide system for scheduling doctors and paging doctors on call. EasyPlot is for scientific plotting and data analysis.  [email or web address]  and use [place]'s universal password to access. If you do not have the password, please contact the hospital operator.  3. Locate the [facility]  provider you are looking for under [facility] and page to a number that you can be directly reached. 4. If you still have difficulty reaching the provider, please page the [facility] (Director on Call) for the Hospitalists listed on amion for assistance.  05/08/2019, 8:18 PM

## 2019-05-08 NOTE — Op Note (Signed)
EGD showed:  No evidence of esophageal or gastric varices. Few erosions, 1 endoclip applied. No evidence of UGI bleeding.  Plan Colonoscopy in am. D/C octreotide drip. Change protonix to 40 mg BID. Colonic prep via NG tube. H and H monitoring and transfusion to keep Hb above 7.  Ronnette Juniper, MD

## 2019-05-08 NOTE — Consult Note (Signed)
NAME:  Erin Holt, MRN:  [id number], DOB:  03-Aug-1951, LOS: 0 ADMISSION DATE:  05/08/2019, CONSULTATION DATE: 7/8 REFERRING MD: Dr. Alcario Drought [facility], CHIEF COMPLAINT: Hemorrhagic shock  Brief History   68 year old female with very recent diagnosis of NASH cirrhosis presenting with 5 days of melena.  Hypotensive despite IV fluid and blood transfusion started on pressors.  History of present illness   68 year old female with past medical history as below, which is significant for persistent atrial fibrillation on Xarelto, hypertension, stroke, carotid artery occlusion, and a very recent diagnosis of NASH cirrhosis.  She has not yet been able to have a full work-up of this yet.  She started having melena on approximately 7/3 which has been persistent since that time up until her presentation to the emergency room on 7/8.  She was found to be hypotensive with blood pressure 86/59 which did respond well to 1 unit PRBC up to systolic pressure of 409.  Initial CBC with hemoglobin of 4.9.  Despite 2 units of PRBC, 1.5 L of IVF, and Kcentra (last Xarelto dose 48 hours ago), she continued to be hypotensive. Lactic acid 5. INR 1.9. PCCM consulted for ICU admission.   Past Medical History   has a past medical history of Arthritis, Atrial fibrillation ([place]), Carotid artery occlusion, Glaucoma (increased eye pressure), Gout, echocardiogram, Hypertension, and Stroke ([place]) (10/09/2012).  Significant Hospital Events   7/8 admit  Consults:  GI  Procedures:    Significant Diagnostic Tests:  CT abdomen/pelvis 7/8 >  Micro Data:    Antimicrobials:  Ceftriaxone   Interim history/subjective:    Objective   Blood pressure (!) 106/93, pulse (!) 135, temperature (!) 97.3 F (36.3 C), resp. rate (!) 27, height 5\' 4"  (1.626 m), weight 93.9 kg, SpO2 92 %.        Intake/Output Summary (Last 24 hours) at 05/08/2019 2142 Last data filed at 05/08/2019 2135 Gross per 24 hour  Intake 1000 ml  Output -  Net  1000 ml   Filed Weights   05/08/19 1802  Weight: 93.9 kg    Examination: General: Overweight middle aged female HENT: [place]/AT, PERRL, no JVD  Lungs: Clear, bilateral breath sounds Cardiovascular: IRIR, mild tachy.  Abdomen: Soft, non-tender, non-distended Extremities: No acute deformity or ROM limitation Neuro: Alert, oriented, non-focal  Resolved Hospital Problem list     Assessment & Plan:   Hemorrhagic shock: secondary to gastrointestinal hemorrhage. S/p 2 units PRBC in ED without resolution and stated on norepinephrine. Kcentra given in ED for Xarelto reversal. Last dose of Xarelto was 7/6.  - Transfuse and pressors to keep MAP > 61mmHg - Give additional 2 units PRBC now and one unit FFP. MTP initiated by ED.  - Trend H&H, Coags - Ensure lactic clearing  Gastrointestinal bleed: Location unclear. Melena x 5 days. Recent diagnosis of NASH cirrhosis. Unknown is she has varices.  - GI planning scope. Will intubate patient for EGD with plans for extubation after.  - Protonix and octreotide infusion  Atrial fibrillation: currently AF rate 110 on monitor.  - Telemetry monitoring. - Holding home Xarelto,   NASH cirrhosis: Tbili 0.6, INR 1.9. Recent diagnosis. Followed by [facility] - GI following.    Best practice:  Diet: NPO Pain/Anxiety/Delirium protocol (if indicated):  VAP protocol (if indicated): NA DVT prophylaxis: SCD GI prophylaxis: PPI infusion Glucose control: NA Mobility: BR Code Status: FULL Family Communication: Patient updated in ED Disposition:   Labs   CBC: Recent Labs  Lab 05/08/19 1751  WBC 14.0*  NEUTROABS 11.4*  HGB 4.9*  HCT 17.7*  MCV 95.7  PLT 093    Basic Metabolic Panel: Recent Labs  Lab 05/08/19 1751  NA 144  K 4.5  CL 117*  CO2 10*  GLUCOSE 182*  BUN 37*  CREATININE 1.67*  CALCIUM 8.9   GFR: Estimated Creatinine Clearance: 35.8 mL/min (A) (by C-G formula based on SCr of 1.67 mg/dL (H)). Recent Labs  Lab 05/08/19 1751  05/08/19 2010  WBC 14.0*  --   LATICACIDVEN  --  10.9*    Liver Function Tests: Recent Labs  Lab 05/08/19 1751  AST 29  ALT 14  ALKPHOS 93  BILITOT 0.6  PROT 6.6  ALBUMIN 3.1*   No results for input(s): LIPASE, AMYLASE in the last 168 hours. No results for input(s): AMMONIA in the last 168 hours.  ABG No results found for: PHART, PCO2ART, PO2ART, HCO3, TCO2, ACIDBASEDEF, O2SAT   Coagulation Profile: Recent Labs  Lab 05/08/19 1751  INR 1.9*    Cardiac Enzymes: No results for input(s): CKTOTAL, CKMB, CKMBINDEX, TROPONINI in the last 168 hours.  HbA1C: Hgb A1c MFr Bld  Date/Time Value Ref Range Status  10/10/2012 06:25 AM 5.8 (H) <5.7 % Final    Comment:    (NOTE)                                                                       According to the ADA Clinical Practice Recommendations for 2011, when HbA1c is used as a screening test:  >=6.5%   Diagnostic of Diabetes Mellitus           (if abnormal result is confirmed) 5.7-6.4%   Increased risk of developing Diabetes Mellitus References:Diagnosis and Classification of Diabetes Mellitus,Diabetes ATFT,7322,02(RKYHC 1):S62-S69 and Standards of Medical Care in         Diabetes - 2011,Diabetes WCBJ,6283,15 (Suppl 1):S11-S61.    CBG: Recent Labs  Lab 05/08/19 1736  GLUCAP 166*    Review of Systems:   Bolds are positive  Constitutional: weight loss, gain, night sweats, Fevers, chills, fatigue .  HEENT: headaches, Sore throat, sneezing, nasal congestion, post nasal drip, Difficulty swallowing, Tooth/dental problems, visual complaints visual changes, ear ache CV:  chest pain, radiates:t,Orthopnea, PND, swelling in lower extremities**, dizziness, palpitations, syncope.  GI  heartburn, indigestion, abdominal pain, nausea, vomiting, diarrhea, change in bowel habits, loss of appetite, dark/bloody stools.  Resp: cough, productive: , hemoptysis, dyspnea, chest pain, pleuritic.  Skin: rash or itching or icterus GU:  dysuria, change in color of urine, urgency or frequency. flank pain, hematuria  MS: joint pain or swelling. decreased range of motion  Psych: change in mood or affect. depression or anxiety.  Neuro: difficulty with speech, weakness, numbness, ataxia    Past Medical History  She,  has a past medical history of Arthritis, Atrial fibrillation ([place]), Carotid artery occlusion, Glaucoma (increased eye pressure), Gout, echocardiogram, Hypertension, and Stroke ([place]) (10/09/2012).   Surgical History    Past Surgical History:  Procedure Laterality Date  . BREAST BIOPSY  1980's   "left; it wasn't anything" (10/09/2012)  . COLONOSCOPY WITH PROPOFOL N/A 12/12/2016   Procedure: COLONOSCOPY WITH PROPOFOL;  Surgeon: Garlan Fair, MD;  [facility];  Service: Endoscopy;  Laterality: N/A;  . IR PARACENTESIS  09/25/2018  . IR PARACENTESIS  10/16/2018  . KNEE ARTHROSCOPY W/ DEBRIDEMENT  2011   "left; for arthritis" (10/09/2012)     Social History   reports that she has never smoked. She has never used smokeless tobacco. She reports that she does not drink alcohol or use drugs.   Family History   Her family history includes CAD (age of onset: 18) in her father; Diabetes in her father and mother; Heart failure (age of onset: 66) in her mother.   Allergies No Known Allergies   Home Medications  Prior to Admission medications   Medication Sig Start Date End Date Taking? Authorizing Provider  allopurinol (ZYLOPRIM) 100 MG tablet Take 100 mg by mouth every evening.    [provider]  atorvastatin (LIPITOR) 40 MG tablet Take 40 mg by mouth every evening.  05/06/15   [provider]  diltiazem (CARTIA XT) 240 MG 24 hr capsule Take 240 mg by mouth daily.    [provider]  furosemide (LASIX) 20 MG tablet Take 20 mg by mouth daily.    [provider]  lactulose (CHRONULAC) 10 GM/15ML solution Take 50 g by mouth daily as needed for mild constipation.  11/06/18    [provider]  latanoprost (XALATAN) 0.005 % ophthalmic solution Place 1 drop into both eyes at bedtime.    [provider]  rivaroxaban (XARELTO) 20 MG TABS tablet Take 1 tablet (20 mg total) by mouth daily with supper. 12/31/18   Lelon Perla, MD  SIMBRINZA 1-0.2 % SUSP Place 1 drop into both eyes 2 (two) times daily.  03/28/18   [provider]     Critical care time: 35 mins     Georgann Housekeeper, AGACNP-BC [place] Pager [phone] or [phone]  05/08/2019 10:12 PM

## 2019-05-08 NOTE — ED Notes (Signed)
Pt continues to be asgitated and nervous  Hyperventilating continuously

## 2019-05-08 NOTE — Progress Notes (Signed)
PCCM at bedside, bedside provider to provider sign out given.  PCCM will take over care and patient going to ICU for hemorrhagic shock now requiring vasopressors.  GI called EDP back: too unstable to EGD right now, but if patient can be stabilized call them back and they will EGD emergently in middle of night.

## 2019-05-08 NOTE — Progress Notes (Addendum)
Despite 1L NS being run wide open, pressure has further dropped to 72/18!  Lactate has come back at 10.9.  1) Running 2nd unit of blood in rapidly 2) Starting levophed through peripheral 3) PCCM consult being placed by EDP, presumably patient will end up going to ICU. 4) re-page to GI being placed by EDP (havent been able to stabilize her enough to get the CTA abd/pelvis yet). 5) Stat ABG ordered  Addendum: 2 more units of PRBC: Unit 4 and 5, have been ordered to prepare and transfuse and patient put on the massive transfusion protocol by EDP.  CRITICAL CARE Performed by: Etta Quill.   Total critical care time: 70 minutes  Critical care time was exclusive of separately billable procedures and treating other patients.  Critical care was necessary to treat or prevent imminent or life-threatening deterioration.  Critical care was time spent personally by me on the following activities: development of treatment plan with patient and/or surrogate as well as nursing, discussions with consultants, evaluation of patient's response to treatment, examination of patient, obtaining history from patient or surrogate, ordering and performing treatments and interventions, ordering and review of laboratory studies, ordering and review of radiographic studies, pulse oximetry and re-evaluation of patient's condition.

## 2019-05-08 NOTE — Consult Note (Signed)
[facility] Consult  Referring Provider: ER/Dr.Mesner Primary Care Physician:  Leeroy Cha, MD Primary Gastroenterologist: Dr.Gedeon Brandow/[facility]  Reason for Consultation:  Melena, hematochezia and acute blood loss anemia  HPI: Erin Holt is a 68 y.o. female with decompensated cirrhosis(likely NASH related), history of ascites,paracentesis(08/2018 and 09/2018),maintained on low dose furosemide(impaired renal function), could not tolerate spironolactone due to hyperkalemia, colonoscopy from 2018 with removal of tubular adenoma, no diverticulosis noted then, unable to tolerate lactulose consistently due to diarrhea, last seen in the office as a Televisit on 03/2019 with plans for outpatient EGD for variceal screening, presented to the ER with c/o progressively worsening SOB for 5-7 days.  She has noted black tarry stools about 2/day for the last 5-6 days and today it was maroon colored. She stopped taking xarelto on Monday, last dose was on 05/06/2019. She denies abdominal pain, nausea, vomiting, denies hematemesis. Last Hb was 13.1 on 01/31/2019 and today she presented to the ER with Hb of 4.9, elevated lactic acid of 10.9, PT/INR of 21.5/1.9, normal liver enzymes. She has received KCENTRA,has been started on protonix drip and octreotide drip and is being transferred to ICU. She dropped her BP from 123/106 on presentation to 52/22 at its lowest and was started on levophed drip in the ER.   Past Medical History:  Diagnosis Date  . Arthritis    "left knee" (10/09/2012)  . Atrial fibrillation ([place])   . Carotid artery occlusion    a. Carotid US (12/10/13):  R 60-79%; L 1-39% - f/u 6 mos  . Glaucoma (increased eye pressure)    "both eyes" (10/09/2012)  . Gout   . Hx of echocardiogram    a. Echo (09/2012):  Mild LVH, EF 55-65%, Gr 1 DD, MAC, mild MR, mild to mod LAE, mild RVE, PASP 44 mmHg  . Hypertension   . Stroke [facility]) 10/09/2012    Past Surgical History:  Procedure  Laterality Date  . BREAST BIOPSY  1980's   "left; it wasn't anything" (10/09/2012)  . COLONOSCOPY WITH PROPOFOL N/A 12/12/2016   Procedure: COLONOSCOPY WITH PROPOFOL;  Surgeon: Garlan Fair, MD;  Location: [facility];  Service: Endoscopy;  Laterality: N/A;  . IR PARACENTESIS  09/25/2018  . IR PARACENTESIS  10/16/2018  . KNEE ARTHROSCOPY W/ DEBRIDEMENT  2011   "left; for arthritis" (10/09/2012)    Prior to Admission medications   Medication Sig Start Date End Date Taking? Authorizing Provider  allopurinol (ZYLOPRIM) 100 MG tablet Take 100 mg by mouth every evening.   Yes [provider]  atorvastatin (LIPITOR) 40 MG tablet Take 40 mg by mouth every evening.  05/06/15  Yes [provider]  diltiazem (CARTIA XT) 240 MG 24 hr capsule Take 240 mg by mouth daily.   Yes [provider]  furosemide (LASIX) 20 MG tablet Take 40 mg by mouth daily.    Yes [provider]  lactulose (CHRONULAC) 10 GM/15ML solution Take 10-50 g by mouth See admin instructions. Take 10-50 grams (15-75 ml's) daily AS DIRECTED 11/06/18  Yes [provider]  latanoprost (XALATAN) 0.005 % ophthalmic solution Place 1 drop into both eyes at bedtime.   Yes [provider]  rivaroxaban (XARELTO) 20 MG TABS tablet Take 1 tablet (20 mg total) by mouth daily with supper. 12/31/18  Yes Crenshaw, Denice Bors, MD  SIMBRINZA 1-0.2 % SUSP Place 1 drop into both eyes 2 (two) times daily.  03/28/18  Yes [provider]    Current Facility-Administered Medications  Medication  Dose Route Frequency Provider Last Rate Last Dose  . 0.9 %  sodium chloride infusion (Manually program via Guardrails IV Fluids)   Intravenous Once Mesner, Jason, MD      . 0.9 %  sodium chloride infusion (Manually program via Guardrails IV Fluids)   Intravenous Once Gardner, Jared M, DO      . 0.9 %  sodium chloride infusion (Manually program via Guardrails IV Fluids)   Intravenous Once Mesner, Jason, MD       . 0.9 %  sodium chloride infusion (Manually program via Guardrails IV Fluids)   Intravenous Once Mesner, Jason, MD      . acetaminophen (TYLENOL) tablet 650 mg  650 mg Oral Q6H PRN Gardner, Jared M, DO       Or  . acetaminophen (TYLENOL) suppository 650 mg  650 mg Rectal Q6H PRN Gardner, Jared M, DO      . brimonidine (ALPHAGAN) 0.2 % ophthalmic solution 1 drop  1 drop Both Eyes BID Gardner, Jared M, DO      . brinzolamide (AZOPT) 1 % ophthalmic suspension 1 drop  1 drop Both Eyes BID Gardner, Jared M, DO      . fentaNYL (SUBLIMAZE) 100 MCG/2ML injection           . lactated ringers bolus 1,000 mL  1,000 mL Intravenous Once Mesner, Jason, MD      . latanoprost (XALATAN) 0.005 % ophthalmic solution 1 drop  1 drop Both Eyes QHS Gardner, Jared M, DO      . midazolam (VERSED) 2 MG/2ML injection           . norepinephrine (LEVOPHED) 4mg in 250mL premix infusion  0-100 mcg/min Intravenous Titrated Mesner, Jason, MD 7.5 mL/hr at 05/08/19 2153 2 mcg/min at 05/08/19 2153  . octreotide (SANDOSTATIN) 500 mcg in sodium chloride 0.9 % 250 mL (2 mcg/mL) infusion  50 mcg/hr Intravenous Continuous Mesner, Jason, MD      . ondansetron (ZOFRAN) tablet 4 mg  4 mg Oral Q6H PRN Gardner, Jared M, DO       Or  . ondansetron (ZOFRAN) injection 4 mg  4 mg Intravenous Q6H PRN Gardner, Jared M, DO      . pantoprazole (PROTONIX) 80 mg in sodium chloride 0.9 % 100 mL IVPB  80 mg Intravenous Once Mesner, Jason, MD      . pantoprazole (PROTONIX) 80 mg in sodium chloride 0.9 % 250 mL (0.32 mg/mL) infusion  8 mg/hr Intravenous Continuous Mesner, Jason, MD      . [START ON 05/12/2019] pantoprazole (PROTONIX) injection 40 mg  40 mg Intravenous Q12H Mesner, Jason, MD      . phytonadione (VITAMIN K) 10 mg in dextrose 5 % 50 mL IVPB  10 mg Intravenous Once Aiza Vollrath, MD      . sodium bicarbonate 150 mEq in sterile water 1,000 mL infusion   Intravenous Continuous Gardner, Jared M, DO      . sodium chloride 0.9 % bolus 1,000 mL   1,000 mL Intravenous Once Gardner, Jared M, DO      . sodium chloride 0.9 % bolus 1,000 mL  1,000 mL Intravenous Once Mesner, Jason, MD       Current Outpatient Medications  Medication Sig Dispense Refill  . allopurinol (ZYLOPRIM) 100 MG tablet Take 100 mg by mouth every evening.    . atorvastatin (LIPITOR) 40 MG tablet Take 40 mg by mouth every evening.   0  . diltiazem (CARTIA XT) 240 MG 24 hr   capsule Take 240 mg by mouth daily.    . furosemide (LASIX) 20 MG tablet Take 40 mg by mouth daily.     Marland Kitchen lactulose (CHRONULAC) 10 GM/15ML solution Take 10-50 g by mouth See admin instructions. Take 10-50 grams (15-75 ml's) daily AS DIRECTED    . latanoprost (XALATAN) 0.005 % ophthalmic solution Place 1 drop into both eyes at bedtime.    . rivaroxaban (XARELTO) 20 MG TABS tablet Take 1 tablet (20 mg total) by mouth daily with supper. 90 tablet 1  . SIMBRINZA 1-0.2 % SUSP Place 1 drop into both eyes 2 (two) times daily.       Allergies as of 05/08/2019  . (No Known Allergies)    Family History  Problem Relation Age of Onset  . Heart failure Mother 71       Died  . Diabetes Mother   . Diabetes Father   . CAD Father 20    Social History   Socioeconomic History  . Marital status: Single    Spouse name: Not on file  . Number of children: Not on file  . Years of education: Not on file  . Highest education level: Not on file  Occupational History  . Not on file  Social Needs  . Financial resource strain: Not on file  . Food insecurity    Worry: Not on file    Inability: Not on file  . Transportation needs    Medical: Not on file    Non-medical: Not on file  Tobacco Use  . Smoking status: Never Smoker  . Smokeless tobacco: Never Used  Substance and Sexual Activity  . Alcohol use: No    Alcohol/week: 0.0 standard drinks  . Drug use: No  . Sexual activity: Never  Lifestyle  . Physical activity    Days per week: Not on file    Minutes per session: Not on file  . Stress: Not on  file  Relationships  . Social Herbalist on phone: Not on file    Gets together: Not on file    Attends religious service: Not on file    Active member of club or organization: Not on file    Attends meetings of clubs or organizations: Not on file    Relationship status: Not on file  . Intimate partner violence    Fear of current or ex partner: Not on file    Emotionally abused: Not on file    Physically abused: Not on file    Forced sexual activity: Not on file  Other Topics Concern  . Not on file  Social History Narrative   Lives alone.    Review of Systems: Positive for: GI: Described in detail in HPI.    Gen: fatigue, weakness, malaise,Denies any fever, chills, rigors, night sweats, anorexia,  involuntary weight loss, and sleep disorder CV: Denies chest pain, angina, palpitations, syncope, orthopnea, PND, peripheral edema, and claudication. Resp:  dyspnea,Denies cough, sputum, wheezing, coughing up blood. GU : Denies urinary burning, blood in urine, urinary frequency, urinary hesitancy, nocturnal urination, and urinary incontinence. MS: Denies joint pain or swelling.  Denies muscle weakness, cramps, atrophy.  Derm: Denies rash, itching, oral ulcerations, hives, unhealing ulcers.  Psych: Denies depression, anxiety, memory loss, suicidal ideation, hallucinations,  and confusion. Heme: rectal bleeding, Denies bruising and enlarged lymph nodes. Neuro:  Denies any headaches, dizziness, paresthesias. Endo:  Denies any problems with DM, thyroid, adrenal function.  Physical Exam: Vital signs in last 24  hours: Temp:  [97.3 F (36.3 C)-98.4 F (36.9 C)] 97.7 F (36.5 C) (07/08 2144) Pulse Rate:  [58-142] 142 (07/08 2156) Resp:  [21-39] 27 (07/08 2200) BP: (52-169)/(18-121) 136/80 (07/08 2200) SpO2:  [72 %-100 %] 94 % (07/08 2156) Weight:  [93.9 kg] 93.9 kg (07/08 1802)    General:   Alert,  Well-developed,overweight, pleasant and cooperative in NAD Head:   Normocephalic and atraumatic.On oxygen via nasal canula Eyes:  Sclera clear, no icterus.   Prominent pallor Ears:  Normal auditory acuity. Nose:  No deformity, discharge,  or lesions. Mouth:  No deformity or lesions.  Oropharynx pink & moist. Neck:  Supple; no masses or thyromegaly. Lungs:  Clear throughout to auscultation.   No wheezes, crackles, or rhonchi. No acute distress. Heart:  Irregular rate and rhythm; no murmurs, clicks, rubs,  or gallops. Extremities:  Without clubbing or edema. Neurologic:  Alert and  oriented x4;  grossly normal neurologically. Skin:  Intact without significant lesions or rashes. Psych:  Alert and cooperative. Normal mood and affect. Abdomen:  Soft, nontender and nondistended. No masses, hepatosplenomegaly or hernias noted. Normal bowel sounds, without guarding, and without rebound.         Lab Results: Recent Labs    05/08/19 1751  WBC 14.0*  HGB 4.9*  HCT 17.7*  PLT 360   BMET Recent Labs    05/08/19 1751  NA 144  K 4.5  CL 117*  CO2 10*  GLUCOSE 182*  BUN 37*  CREATININE 1.67*  CALCIUM 8.9   LFT Recent Labs    05/08/19 1751  PROT 6.6  ALBUMIN 3.1*  AST 29  ALT 14  ALKPHOS 93  BILITOT 0.6   PT/INR Recent Labs    05/08/19 1751 05/08/19 2140  LABPROT 21.5* 19.0*  INR 1.9* 1.6*    Studies/Results: No results found.  Impression: Acute blood loss anemia, hemorrhagic shock in the setting of cirrhosis, suspicious for variceal bleeding, although her BUN is at her baseline. Last dose of xarelto 2 days ago, 05/06/2019 as per patient and she has received KCENTRA. MELDna is 19.    Plan: Emergent EGD with possible banding now. The risks and benefits of the procedure were discussed with the patient in details. She understands and verbalizes consent. Discussed with ICU team, plan is to intubate her at bedside in ICU for the procedure and perform EGD under sedation. She is receiving her 3rd unit of PRBC and has been started on  levophed, while being continued on protonix drip and octreotide drip.    LOS: 0 days   Ronnette Juniper, MD  05/08/2019, 10:12 PM  Pager [phone] If no answer or after 5 PM call [phone]

## 2019-05-08 NOTE — H&P (View-Only) (Signed)
[facility] Consult  Referring Provider: ER/Dr.Mesner Primary Care Physician:  Leeroy Cha, MD Primary Gastroenterologist: Dr.Maribell Demeo/[facility]  Reason for Consultation:  Melena, hematochezia and acute blood loss anemia  HPI: Erin Holt is a 68 y.o. female with decompensated cirrhosis(likely NASH related), history of ascites,paracentesis(08/2018 and 09/2018),maintained on low dose furosemide(impaired renal function), could not tolerate spironolactone due to hyperkalemia, colonoscopy from 2018 with removal of tubular adenoma, no diverticulosis noted then, unable to tolerate lactulose consistently due to diarrhea, last seen in the office as a Televisit on 03/2019 with plans for outpatient EGD for variceal screening, presented to the ER with c/o progressively worsening SOB for 5-7 days.  She has noted black tarry stools about 2/day for the last 5-6 days and today it was maroon colored. She stopped taking xarelto on Monday, last dose was on 05/06/2019. She denies abdominal pain, nausea, vomiting, denies hematemesis. Last Hb was 13.1 on 01/31/2019 and today she presented to the ER with Hb of 4.9, elevated lactic acid of 10.9, PT/INR of 21.5/1.9, normal liver enzymes. She has received KCENTRA,has been started on protonix drip and octreotide drip and is being transferred to ICU. She dropped her BP from 123/106 on presentation to 52/22 at its lowest and was started on levophed drip in the ER.   Past Medical History:  Diagnosis Date  . Arthritis    "left knee" (10/09/2012)  . Atrial fibrillation ([place])   . Carotid artery occlusion    a. Carotid US (12/10/13):  R 60-79%; L 1-39% - f/u 6 mos  . Glaucoma (increased eye pressure)    "both eyes" (10/09/2012)  . Gout   . Hx of echocardiogram    a. Echo (09/2012):  Mild LVH, EF 55-65%, Gr 1 DD, MAC, mild MR, mild to mod LAE, mild RVE, PASP 44 mmHg  . Hypertension   . Stroke [facility]) 10/09/2012    Past Surgical History:  Procedure  Laterality Date  . BREAST BIOPSY  1980's   "left; it wasn't anything" (10/09/2012)  . COLONOSCOPY WITH PROPOFOL N/A 12/12/2016   Procedure: COLONOSCOPY WITH PROPOFOL;  Surgeon: Garlan Fair, MD;  Location: [facility];  Service: Endoscopy;  Laterality: N/A;  . IR PARACENTESIS  09/25/2018  . IR PARACENTESIS  10/16/2018  . KNEE ARTHROSCOPY W/ DEBRIDEMENT  2011   "left; for arthritis" (10/09/2012)    Prior to Admission medications   Medication Sig Start Date End Date Taking? Authorizing Provider  allopurinol (ZYLOPRIM) 100 MG tablet Take 100 mg by mouth every evening.   Yes [provider]  atorvastatin (LIPITOR) 40 MG tablet Take 40 mg by mouth every evening.  05/06/15  Yes [provider]  diltiazem (CARTIA XT) 240 MG 24 hr capsule Take 240 mg by mouth daily.   Yes [provider]  furosemide (LASIX) 20 MG tablet Take 40 mg by mouth daily.    Yes [provider]  lactulose (CHRONULAC) 10 GM/15ML solution Take 10-50 g by mouth See admin instructions. Take 10-50 grams (15-75 ml's) daily AS DIRECTED 11/06/18  Yes [provider]  latanoprost (XALATAN) 0.005 % ophthalmic solution Place 1 drop into both eyes at bedtime.   Yes [provider]  rivaroxaban (XARELTO) 20 MG TABS tablet Take 1 tablet (20 mg total) by mouth daily with supper. 12/31/18  Yes Crenshaw, Denice Bors, MD  SIMBRINZA 1-0.2 % SUSP Place 1 drop into both eyes 2 (two) times daily.  03/28/18  Yes [provider]    Current Facility-Administered Medications  Medication  Dose Route Frequency Provider Last Rate Last Dose  . 0.9 %  sodium chloride infusion (Manually program via Guardrails IV Fluids)   Intravenous Once Mesner, Corene Cornea, MD      . 0.9 %  sodium chloride infusion (Manually program via Guardrails IV Fluids)   Intravenous Once Alcario Drought, Jared M, DO      . 0.9 %  sodium chloride infusion (Manually program via Guardrails IV Fluids)   Intravenous Once Mesner, Corene Cornea, MD       . 0.9 %  sodium chloride infusion (Manually program via Guardrails IV Fluids)   Intravenous Once Mesner, Corene Cornea, MD      . acetaminophen (TYLENOL) tablet 650 mg  650 mg Oral Q6H PRN Etta Quill, DO       Or  . acetaminophen (TYLENOL) suppository 650 mg  650 mg Rectal Q6H PRN Etta Quill, DO      . brimonidine (ALPHAGAN) 0.2 % ophthalmic solution 1 drop  1 drop Both Eyes BID Alcario Drought, Jared M, DO      . brinzolamide (AZOPT) 1 % ophthalmic suspension 1 drop  1 drop Both Eyes BID Jennette Kettle M, DO      . fentaNYL (SUBLIMAZE) 100 MCG/2ML injection           . lactated ringers bolus 1,000 mL  1,000 mL Intravenous Once Mesner, Corene Cornea, MD      . latanoprost (XALATAN) 0.005 % ophthalmic solution 1 drop  1 drop Both Eyes QHS Jennette Kettle M, DO      . midazolam (VERSED) 2 MG/2ML injection           . norepinephrine (LEVOPHED) 37m in 2589mpremix infusion  0-100 mcg/min Intravenous Titrated Mesner, Jason, MD 7.5 mL/hr at 05/08/19 2153 2 mcg/min at 05/08/19 2153  . octreotide (SANDOSTATIN) 500 mcg in sodium chloride 0.9 % 250 mL (2 mcg/mL) infusion  50 mcg/hr Intravenous Continuous Mesner, JaCorene CorneaMD      . ondansetron (ZOFRAN) tablet 4 mg  4 mg Oral Q6H PRN GaEtta QuillDO       Or  . ondansetron ([facility] mg  4 mg Intravenous Q6H PRN GaEtta QuillDO      . pantoprazole (PROTONIX) 80 mg in sodium chloride 0.9 % 100 mL IVPB  80 mg Intravenous Once Mesner, JaCorene CorneaMD      . pantoprazole (PROTONIX) 80 mg in sodium chloride 0.9 % 250 mL (0.32 mg/mL) infusion  8 mg/hr Intravenous Continuous Mesner, JaCorene CorneaMD      . [SDerrill MemoN 05/12/2019] pantoprazole (PROTONIX) injection 40 mg  40 mg Intravenous Q12H Mesner, JaCorene CorneaMD      . phytonadione (VITAMIN K) 10 mg in dextrose 5 % 50 mL IVPB  10 mg Intravenous Once KaRonnette JuniperMD      . sodium bicarbonate 150 mEq in sterile water 1,000 mL infusion   Intravenous Continuous GaJennette Kettle, DO      . sodium chloride 0.9 % bolus 1,000 mL   1,000 mL Intravenous Once GaAlcario DroughtJared M, DO      . sodium chloride 0.9 % bolus 1,000 mL  1,000 mL Intravenous Once Mesner, JaCorene CorneaMD       Current Outpatient Medications  Medication Sig Dispense Refill  . allopurinol (ZYLOPRIM) 100 MG tablet Take 100 mg by mouth every evening.    . Marland Kitchentorvastatin (LIPITOR) 40 MG tablet Take 40 mg by mouth every evening.   0  . diltiazem (CARTIA XT) 240 MG 24 hr  capsule Take 240 mg by mouth daily.    . furosemide (LASIX) 20 MG tablet Take 40 mg by mouth daily.     Marland Kitchen lactulose (CHRONULAC) 10 GM/15ML solution Take 10-50 g by mouth See admin instructions. Take 10-50 grams (15-75 ml's) daily AS DIRECTED    . latanoprost (XALATAN) 0.005 % ophthalmic solution Place 1 drop into both eyes at bedtime.    . rivaroxaban (XARELTO) 20 MG TABS tablet Take 1 tablet (20 mg total) by mouth daily with supper. 90 tablet 1  . SIMBRINZA 1-0.2 % SUSP Place 1 drop into both eyes 2 (two) times daily.       Allergies as of 05/08/2019  . (No Known Allergies)    Family History  Problem Relation Age of Onset  . Heart failure Mother 71       Died  . Diabetes Mother   . Diabetes Father   . CAD Father 20    Social History   Socioeconomic History  . Marital status: Single    Spouse name: Not on file  . Number of children: Not on file  . Years of education: Not on file  . Highest education level: Not on file  Occupational History  . Not on file  Social Needs  . Financial resource strain: Not on file  . Food insecurity    Worry: Not on file    Inability: Not on file  . Transportation needs    Medical: Not on file    Non-medical: Not on file  Tobacco Use  . Smoking status: Never Smoker  . Smokeless tobacco: Never Used  Substance and Sexual Activity  . Alcohol use: No    Alcohol/week: 0.0 standard drinks  . Drug use: No  . Sexual activity: Never  Lifestyle  . Physical activity    Days per week: Not on file    Minutes per session: Not on file  . Stress: Not on  file  Relationships  . Social Herbalist on phone: Not on file    Gets together: Not on file    Attends religious service: Not on file    Active member of club or organization: Not on file    Attends meetings of clubs or organizations: Not on file    Relationship status: Not on file  . Intimate partner violence    Fear of current or ex partner: Not on file    Emotionally abused: Not on file    Physically abused: Not on file    Forced sexual activity: Not on file  Other Topics Concern  . Not on file  Social History Narrative   Lives alone.    Review of Systems: Positive for: GI: Described in detail in HPI.    Gen: fatigue, weakness, malaise,Denies any fever, chills, rigors, night sweats, anorexia,  involuntary weight loss, and sleep disorder CV: Denies chest pain, angina, palpitations, syncope, orthopnea, PND, peripheral edema, and claudication. Resp:  dyspnea,Denies cough, sputum, wheezing, coughing up blood. GU : Denies urinary burning, blood in urine, urinary frequency, urinary hesitancy, nocturnal urination, and urinary incontinence. MS: Denies joint pain or swelling.  Denies muscle weakness, cramps, atrophy.  Derm: Denies rash, itching, oral ulcerations, hives, unhealing ulcers.  Psych: Denies depression, anxiety, memory loss, suicidal ideation, hallucinations,  and confusion. Heme: rectal bleeding, Denies bruising and enlarged lymph nodes. Neuro:  Denies any headaches, dizziness, paresthesias. Endo:  Denies any problems with DM, thyroid, adrenal function.  Physical Exam: Vital signs in last 24  hours: Temp:  [97.3 F (36.3 C)-98.4 F (36.9 C)] 97.7 F (36.5 C) (07/08 2144) Pulse Rate:  [58-142] 142 (07/08 2156) Resp:  [21-39] 27 (07/08 2200) BP: (52-169)/(18-121) 136/80 (07/08 2200) SpO2:  [72 %-100 %] 94 % (07/08 2156) Weight:  [93.9 kg] 93.9 kg (07/08 1802)    General:   Alert,  Well-developed,overweight, pleasant and cooperative in NAD Head:   Normocephalic and atraumatic.On oxygen via nasal canula Eyes:  Sclera clear, no icterus.   Prominent pallor Ears:  Normal auditory acuity. Nose:  No deformity, discharge,  or lesions. Mouth:  No deformity or lesions.  Oropharynx pink & moist. Neck:  Supple; no masses or thyromegaly. Lungs:  Clear throughout to auscultation.   No wheezes, crackles, or rhonchi. No acute distress. Heart:  Irregular rate and rhythm; no murmurs, clicks, rubs,  or gallops. Extremities:  Without clubbing or edema. Neurologic:  Alert and  oriented x4;  grossly normal neurologically. Skin:  Intact without significant lesions or rashes. Psych:  Alert and cooperative. Normal mood and affect. Abdomen:  Soft, nontender and nondistended. No masses, hepatosplenomegaly or hernias noted. Normal bowel sounds, without guarding, and without rebound.         Lab Results: Recent Labs    05/08/19 1751  WBC 14.0*  HGB 4.9*  HCT 17.7*  PLT 360   BMET Recent Labs    05/08/19 1751  NA 144  K 4.5  CL 117*  CO2 10*  GLUCOSE 182*  BUN 37*  CREATININE 1.67*  CALCIUM 8.9   LFT Recent Labs    05/08/19 1751  PROT 6.6  ALBUMIN 3.1*  AST 29  ALT 14  ALKPHOS 93  BILITOT 0.6   PT/INR Recent Labs    05/08/19 1751 05/08/19 2140  LABPROT 21.5* 19.0*  INR 1.9* 1.6*    Studies/Results: No results found.  Impression: Acute blood loss anemia, hemorrhagic shock in the setting of cirrhosis, suspicious for variceal bleeding, although her BUN is at her baseline. Last dose of xarelto 2 days ago, 05/06/2019 as per patient and she has received KCENTRA. MELDna is 19.    Plan: Emergent EGD with possible banding now. The risks and benefits of the procedure were discussed with the patient in details. She understands and verbalizes consent. Discussed with ICU team, plan is to intubate her at bedside in ICU for the procedure and perform EGD under sedation. She is receiving her 3rd unit of PRBC and has been started on  levophed, while being continued on protonix drip and octreotide drip.    LOS: 0 days   Ronnette Juniper, MD  05/08/2019, 10:12 PM  Pager [phone] If no answer or after 5 PM call [phone]

## 2019-05-09 ENCOUNTER — Inpatient Hospital Stay (HOSPITAL_COMMUNITY): Payer: Medicare Other

## 2019-05-09 ENCOUNTER — Encounter (HOSPITAL_COMMUNITY): Payer: Self-pay | Admitting: Gastroenterology

## 2019-05-09 ENCOUNTER — Encounter (HOSPITAL_COMMUNITY)
Admission: EM | Disposition: A | Discharge status: Home / Self Care | Payer: Self-pay | Source: Home / Self Care | Attending: Internal Medicine

## 2019-05-09 DIAGNOSIS — J9601 Acute respiratory failure with hypoxia: Secondary | ICD-10-CM

## 2019-05-09 DIAGNOSIS — R579 Shock, unspecified: Secondary | ICD-10-CM

## 2019-05-09 HISTORY — PX: COLONOSCOPY WITH PROPOFOL: SHX5780

## 2019-05-09 LAB — BASIC METABOLIC PANEL
Anion gap: 18 — ABNORMAL HIGH (ref 5–15)
BUN: 39 mg/dL — ABNORMAL HIGH (ref 8–23)
CO2: 15 mmol/L — ABNORMAL LOW (ref 22–32)
Calcium: 7.6 mg/dL — ABNORMAL LOW (ref 8.9–10.3)
Chloride: 111 mmol/L (ref 98–111)
Creatinine, Ser: 1.75 mg/dL — ABNORMAL HIGH (ref 0.44–1.00)
GFR calc Af Amer: 34 mL/min — ABNORMAL LOW (ref >60)
GFR calc non Af Amer: 29 mL/min — ABNORMAL LOW (ref >60)
Glucose, Bld: 144 mg/dL — ABNORMAL HIGH (ref 70–99)
Potassium: 4.2 mmol/L (ref 3.5–5.1)
Sodium: 144 mmol/L (ref 135–145)

## 2019-05-09 LAB — POCT I-STAT 7, (LYTES, BLD GAS, ICA,H+H)
Acid-base deficit: 9 mmol/L — ABNORMAL HIGH (ref 0.0–2.0)
Acid-base deficit: 5 mmol/L — ABNORMAL HIGH (ref 0.0–2.0)
Bicarbonate: 15.7 mmol/L — ABNORMAL LOW (ref 20.0–28.0)
Bicarbonate: 17 mmol/L — ABNORMAL LOW (ref 20.0–28.0)
Calcium, Ion: 1.04 mmol/L — ABNORMAL LOW (ref 1.15–1.40)
Calcium, Ion: 1.01 mmol/L — ABNORMAL LOW (ref 1.15–1.40)
HCT: 30 % — ABNORMAL LOW (ref 36.0–46.0)
HCT: 30 % — ABNORMAL LOW (ref 36.0–46.0)
Hemoglobin: 10.2 g/dL — ABNORMAL LOW (ref 12.0–15.0)
Hemoglobin: 10.2 g/dL — ABNORMAL LOW (ref 12.0–15.0)
O2 Saturation: 100 %
O2 Saturation: 100 %
Patient temperature: 97.5
Patient temperature: 99
Potassium: 4 mmol/L (ref 3.5–5.1)
Potassium: 3.9 mmol/L (ref 3.5–5.1)
Sodium: 146 mmol/L — ABNORMAL HIGH (ref 135–145)
Sodium: 146 mmol/L — ABNORMAL HIGH (ref 135–145)
TCO2: 17 mmol/L — ABNORMAL LOW (ref 22–32)
TCO2: 18 mmol/L — ABNORMAL LOW (ref 22–32)
pCO2 arterial: 28.9 mmHg — ABNORMAL LOW (ref 32.0–48.0)
pCO2 arterial: 23.1 mmHg — ABNORMAL LOW (ref 32.0–48.0)
pH, Arterial: 7.339 — ABNORMAL LOW (ref 7.350–7.450)
pH, Arterial: 7.476 — ABNORMAL HIGH (ref 7.350–7.450)
pO2, Arterial: 388 mmHg — ABNORMAL HIGH (ref 83.0–108.0)
pO2, Arterial: 151 mmHg — ABNORMAL HIGH (ref 83.0–108.0)

## 2019-05-09 LAB — CBC
HCT: 31.3 % — ABNORMAL LOW (ref 36.0–46.0)
HCT: 31.6 % — ABNORMAL LOW (ref 36.0–46.0)
HCT: 28.5 % — ABNORMAL LOW (ref 36.0–46.0)
HCT: 26.1 % — ABNORMAL LOW (ref 36.0–46.0)
Hemoglobin: 10 g/dL — ABNORMAL LOW (ref 12.0–15.0)
Hemoglobin: 10.4 g/dL — ABNORMAL LOW (ref 12.0–15.0)
Hemoglobin: 9.5 g/dL — ABNORMAL LOW (ref 12.0–15.0)
Hemoglobin: 8.5 g/dL — ABNORMAL LOW (ref 12.0–15.0)
MCH: 28.5 pg (ref 26.0–34.0)
MCH: 28.7 pg (ref 26.0–34.0)
MCH: 28.2 pg (ref 26.0–34.0)
MCH: 27.5 pg (ref 26.0–34.0)
MCHC: 31.6 g/dL (ref 30.0–36.0)
MCHC: 33.2 g/dL (ref 30.0–36.0)
MCHC: 33.3 g/dL (ref 30.0–36.0)
MCHC: 32.6 g/dL (ref 30.0–36.0)
MCV: 86.2 fL (ref 80.0–100.0)
MCV: 90 fL (ref 80.0–100.0)
MCV: 84.6 fL (ref 80.0–100.0)
MCV: 84.5 fL (ref 80.0–100.0)
Platelets: 327 10*3/uL (ref 150–400)
Platelets: 338 10*3/uL (ref 150–400)
Platelets: 280 10*3/uL (ref 150–400)
Platelets: 226 10*3/uL (ref 150–400)
RBC: 3.51 MIL/uL — ABNORMAL LOW (ref 3.87–5.11)
RBC: 3.63 MIL/uL — ABNORMAL LOW (ref 3.87–5.11)
RBC: 3.37 MIL/uL — ABNORMAL LOW (ref 3.87–5.11)
RBC: 3.09 MIL/uL — ABNORMAL LOW (ref 3.87–5.11)
RDW: 14.6 % (ref 11.5–15.5)
RDW: 14.8 % (ref 11.5–15.5)
RDW: 14.9 % (ref 11.5–15.5)
RDW: 14.8 % (ref 11.5–15.5)
WBC: 15.3 10*3/uL — ABNORMAL HIGH (ref 4.0–10.5)
WBC: 18.1 10*3/uL — ABNORMAL HIGH (ref 4.0–10.5)
WBC: 17.2 10*3/uL — ABNORMAL HIGH (ref 4.0–10.5)
WBC: 13.4 10*3/uL — ABNORMAL HIGH (ref 4.0–10.5)
nRBC: 1.4 % — ABNORMAL HIGH (ref 0.0–0.2)
nRBC: 2 % — ABNORMAL HIGH (ref 0.0–0.2)
nRBC: 3.1 % — ABNORMAL HIGH (ref 0.0–0.2)
nRBC: 2.5 % — ABNORMAL HIGH (ref 0.0–0.2)

## 2019-05-09 LAB — BPAM FFP
Blood Product Expiration Date: 202007132359
Blood Product Expiration Date: 202007132359
Blood Product Expiration Date: 202007132359
Blood Product Expiration Date: 202007302359
Blood Product Expiration Date: 202007302359
Blood Product Expiration Date: 202007302359
Blood Product Expiration Date: 202007302359
Blood Product Expiration Date: 202007132359
Blood Product Expiration Date: 202007132359
Blood Product Expiration Date: 202007132359
Blood Product Expiration Date: 202007132359
ISSUE DATE / TIME: 202007082119
ISSUE DATE / TIME: 202007082131
ISSUE DATE / TIME: 202007090244
ISSUE DATE / TIME: 202007090446
ISSUE DATE / TIME: 202007090446
ISSUE DATE / TIME: 202007090459
Unit Type and Rh: 5100
Unit Type and Rh: 600
Unit Type and Rh: 600
Unit Type and Rh: 600
Unit Type and Rh: 6200
Unit Type and Rh: 6200
Unit Type and Rh: 6200
Unit Type and Rh: 5100
Unit Type and Rh: 5100
Unit Type and Rh: 5100
Unit Type and Rh: 6200

## 2019-05-09 LAB — PREPARE FRESH FROZEN PLASMA
Unit division: 0
Unit division: 0
Unit division: 0
Unit division: 0
Unit division: 0
Unit division: 0
Unit division: 0
Unit division: 0
Unit division: 0

## 2019-05-09 LAB — MRSA PCR SCREENING: MRSA by PCR: NEGATIVE

## 2019-05-09 LAB — PREPARE CRYOPRECIPITATE: Unit division: 0

## 2019-05-09 LAB — BPAM CRYOPRECIPITATE
Blood Product Expiration Date: 202007090330
Unit Type and Rh: 5100

## 2019-05-09 LAB — PREPARE PLATELET PHERESIS: Unit division: 0

## 2019-05-09 LAB — MAGNESIUM: Magnesium: 2.1 mg/dL (ref 1.7–2.4)

## 2019-05-09 LAB — GLUCOSE, CAPILLARY
Glucose-Capillary: 154 mg/dL — ABNORMAL HIGH (ref 70–99)
Glucose-Capillary: 175 mg/dL — ABNORMAL HIGH (ref 70–99)

## 2019-05-09 LAB — DIC (DISSEMINATED INTRAVASCULAR COAGULATION)PANEL
D-Dimer, Quant: 2.88 ug/mL-FEU — ABNORMAL HIGH (ref 0.00–0.50)
Fibrinogen: 314 mg/dL (ref 210–475)
INR: 1.4 — ABNORMAL HIGH (ref 0.8–1.2)
Platelets: 310 10*3/uL (ref 150–400)
Prothrombin Time: 16.6 seconds — ABNORMAL HIGH (ref 11.4–15.2)
Smear Review: NONE SEEN
aPTT: 30 seconds (ref 24–36)

## 2019-05-09 LAB — BPAM PLATELET PHERESIS
Blood Product Expiration Date: 202007092359
ISSUE DATE / TIME: 202007082140
Unit Type and Rh: 5100

## 2019-05-09 LAB — PHOSPHORUS: Phosphorus: 6.7 mg/dL — ABNORMAL HIGH (ref 2.5–4.6)

## 2019-05-09 LAB — LACTIC ACID, PLASMA
Lactic Acid, Venous: 6.6 mmol/L (ref 0.5–1.9)
Lactic Acid, Venous: 4.2 mmol/L (ref 0.5–1.9)
Lactic Acid, Venous: 3.2 mmol/L (ref 0.5–1.9)

## 2019-05-09 LAB — PROTIME-INR
INR: 1.5 — ABNORMAL HIGH (ref 0.8–1.2)
Prothrombin Time: 17.5 seconds — ABNORMAL HIGH (ref 11.4–15.2)

## 2019-05-09 SURGERY — COLONOSCOPY WITH PROPOFOL
Anesthesia: Moderate Sedation

## 2019-05-09 MED ORDER — DILTIAZEM HCL ER COATED BEADS 240 MG PO CP24: 240.0000 mg | ORAL_CAPSULE | Freq: Every day | ORAL | Status: DC

## 2019-05-09 MED ORDER — MIDAZOLAM HCL (PF) 5 MG/ML IJ SOLN
INTRAMUSCULAR | Status: AC
  Filled 2019-05-09: qty 2

## 2019-05-09 MED ORDER — EPINEPHRINE 1 MG/10ML IJ SOSY
PREFILLED_SYRINGE | INTRAMUSCULAR | Status: AC
  Filled 2019-05-09: qty 20

## 2019-05-09 MED ORDER — DILTIAZEM HCL-DEXTROSE 100-5 MG/100ML-% IV SOLN (PREMIX)
5.0000 mg/h | INTRAVENOUS | Status: DC
  Administered 2019-05-09 (×2): 5 mg/h via INTRAVENOUS
  Administered 2019-05-10: 02:00:00 10 mg/h via INTRAVENOUS
  Filled 2019-05-09 (×5): qty 100

## 2019-05-09 MED ORDER — CHLORHEXIDINE GLUCONATE CLOTH 2 % EX PADS: 6.0000 | MEDICATED_PAD | Freq: Every day | CUTANEOUS | Status: DC

## 2019-05-09 MED ORDER — SPOT INK MARKER SYRINGE KIT
PACK | SUBMUCOSAL | Status: AC
  Filled 2019-05-09: qty 5

## 2019-05-09 MED ORDER — NOREPINEPHRINE 4 MG/250ML-% IV SOLN
INTRAVENOUS | Status: AC
  Filled 2019-05-09: qty 250

## 2019-05-09 MED ORDER — MIDAZOLAM HCL (PF) 5 MG/ML IJ SOLN
INTRAMUSCULAR | Status: DC | PRN
  Administered 2019-05-09: 2 mg via INTRAVENOUS

## 2019-05-09 MED ORDER — SODIUM CHLORIDE 0.9% FLUSH: 10.0000 mL | INTRAVENOUS | Status: DC | PRN

## 2019-05-09 MED ORDER — SODIUM CHLORIDE 0.9 % IV SOLN
INTRAVENOUS | Status: DC
  Administered 2019-05-09: 09:00:00 via INTRAVENOUS

## 2019-05-09 MED ORDER — CHLORHEXIDINE GLUCONATE CLOTH 2 % EX PADS
6.0000 | MEDICATED_PAD | Freq: Every day | CUTANEOUS | Status: DC
  Administered 2019-05-09 – 2019-05-11 (×3): 6 via TOPICAL

## 2019-05-09 MED ORDER — PANTOPRAZOLE SODIUM 40 MG IV SOLR
40.0000 mg | Freq: Two times a day (BID) | INTRAVENOUS | Status: DC
  Administered 2019-05-09 – 2019-05-10 (×4): 40 mg via INTRAVENOUS
  Filled 2019-05-09 (×4): qty 40

## 2019-05-09 MED ORDER — ATORVASTATIN CALCIUM 40 MG PO TABS
40.0000 mg | ORAL_TABLET | Freq: Every evening | ORAL | Status: DC
  Administered 2019-05-10 – 2019-05-21 (×12): 40 mg via ORAL
  Filled 2019-05-09 (×12): qty 1

## 2019-05-09 MED ORDER — PHENYLEPHRINE HCL-NACL 40-0.9 MG/250ML-% IV SOLN
0.0000 ug/min | INTRAVENOUS | Status: DC
  Administered 2019-05-09: 01:00:00 400 ug/min via INTRAVENOUS
  Administered 2019-05-09: 14:00:00 40 ug/min via INTRAVENOUS
  Administered 2019-05-09: 100 ug/min via INTRAVENOUS
  Filled 2019-05-09 (×3): qty 250

## 2019-05-09 MED ORDER — CIPROFLOXACIN IN D5W 400 MG/200ML IV SOLN
400.0000 mg | Freq: Two times a day (BID) | INTRAVENOUS | Status: DC
  Administered 2019-05-09 – 2019-05-10 (×2): 400 mg via INTRAVENOUS
  Filled 2019-05-09 (×2): qty 200

## 2019-05-09 MED ORDER — RIVAROXABAN 20 MG PO TABS: 20.0000 mg | ORAL_TABLET | Freq: Every day | ORAL | Status: DC

## 2019-05-09 MED ORDER — DILTIAZEM LOAD VIA INFUSION
15.0000 mg | Freq: Once | INTRAVENOUS | Status: AC
  Administered 2019-05-09: 03:00:00 15 mg via INTRAVENOUS
  Filled 2019-05-09: qty 15

## 2019-05-09 MED ORDER — PHENTOLAMINE MESYLATE 5 MG IJ SOLR
5.0000 mg | Freq: Once | INTRAMUSCULAR | Status: DC
  Filled 2019-05-09: qty 5

## 2019-05-09 MED ORDER — SODIUM CHLORIDE 0.9 % IV SOLN
INTRAVENOUS | Status: DC
  Administered 2019-05-09: 03:00:00 via INTRAVENOUS

## 2019-05-09 MED ORDER — FUROSEMIDE 40 MG PO TABS: 40.0000 mg | ORAL_TABLET | Freq: Every day | ORAL | Status: DC

## 2019-05-09 MED ORDER — SODIUM CHLORIDE 0.9 % IV SOLN: INTRAVENOUS | Status: DC | PRN

## 2019-05-09 MED ORDER — LACTULOSE 10 GM/15ML PO SOLN
30.0000 g | Freq: Every day | ORAL | Status: DC
  Administered 2019-05-09 – 2019-05-10 (×2): 30 g via ORAL
  Filled 2019-05-09 (×2): qty 45

## 2019-05-09 MED ORDER — NITROGLYCERIN 2 % TD OINT
1.0000 [in_us] | TOPICAL_OINTMENT | Freq: Three times a day (TID) | TRANSDERMAL | Status: DC
  Filled 2019-05-09: qty 30

## 2019-05-09 MED ORDER — ALLOPURINOL 100 MG PO TABS
100.0000 mg | ORAL_TABLET | Freq: Every evening | ORAL | Status: DC
  Administered 2019-05-10 – 2019-05-21 (×12): 100 mg via ORAL
  Filled 2019-05-09 (×12): qty 1

## 2019-05-09 MED ORDER — FENTANYL CITRATE (PF) 100 MCG/2ML IJ SOLN
INTRAMUSCULAR | Status: AC
  Filled 2019-05-09: qty 4

## 2019-05-09 MED ORDER — CHLORHEXIDINE GLUCONATE 0.12% ORAL RINSE (MEDLINE KIT)
15.0000 mL | Freq: Two times a day (BID) | OROMUCOSAL | Status: DC
  Administered 2019-05-09 – 2019-05-10 (×3): 15 mL via OROMUCOSAL

## 2019-05-09 MED ORDER — SODIUM CHLORIDE 0.9% FLUSH
10.0000 mL | Freq: Two times a day (BID) | INTRAVENOUS | Status: DC
  Administered 2019-05-09 – 2019-05-11 (×4): 10 mL

## 2019-05-09 MED ORDER — ORAL CARE MOUTH RINSE
15.0000 mL | OROMUCOSAL | Status: DC
  Administered 2019-05-09 – 2019-05-10 (×14): 15 mL via OROMUCOSAL

## 2019-05-09 SURGICAL SUPPLY — 22 items

## 2019-05-09 NOTE — Progress Notes (Signed)
NAME:  Erin Holt, MRN:  [id number], DOB:  [birth year], LOS: 1 ADMISSION DATE:  05/08/2019, CONSULTATION DATE:  7/8 REFERRING MD:  Dr Alcario Drought, TRH, CHIEF COMPLAINT:  Hemorrhagic  Brief History   68 year old female with very recent diagnosis of NASH cirrhosis presenting with 5 days of melena.  Hypotensive despite IV fluid and blood transfusion started on pressors.  History of present illness   68 year old female with past medical history as below, which is significant for persistent atrial fibrillation on Xarelto, hypertension, stroke, carotid artery occlusion, and a very recent diagnosis of NASH cirrhosis.  She has not yet been able to have a full work-up of this yet.  She started having melena on approximately 7/3 which has been persistent since that time up until her presentation to the emergency room on 7/8.  She was found to be hypotensive with blood pressure 86/59 which did respond well to 1 unit PRBC up to systolic pressure of 841.  Initial CBC with hemoglobin of 4.9.  Despite 2 units of PRBC, 1.5 L of IVF, and Kcentra (last Xarelto dose 48 hours ago), she continued to be hypotensive. Lactic acid 5. INR 1.9. PCCM consulted for ICU admission.   Past Medical History   Past Medical History:  Diagnosis Date  . Arthritis    "left knee" (10/09/2012)  . Atrial fibrillation ([place])   . Carotid artery occlusion    a. Carotid US (12/10/13):  R 60-79%; L 1-39% - f/u 6 mos  . Glaucoma (increased eye pressure)    "both eyes" (10/09/2012)  . Gout   . Hx of echocardiogram    a. Echo (09/2012):  Mild LVH, EF 55-65%, Gr 1 DD, MAC, mild MR, mild to mod LAE, mild RVE, PASP 44 mmHg  . Hypertension   . Stroke ([place]) 10/09/2012    Significant Hospital Events   7/8 admit to ICU  Consults:  GI  Procedures:  7/8 ett 7/9 cvc  Significant Diagnostic Tests:  7/8 CT abd/pelvis: 1. No findings to localize source of GI bleed. 2. Moderate volume ascites in the abdomen and pelvis, diminished fluid  volume from December 2019 CT. Fluid appears slightly heterogeneous and greater density than seen with simple ascites. Findings may reflect complex ascites/peritonitis, however an element of hemorrhage is also considered without identifiable source. 3. Cirrhotic hepatic morphology. Possible sludge in the gallbladder and gallbladder wall thickening. 4. Left pleural effusion is partially included. Bilateral lower lobe atelectasis.  Micro Data:  7/8 sars2: neg  Antimicrobials:    Interim history/subjective:  7/9: intubated overnight with cvc placed, for c-scope today  Objective   Blood pressure (!) 86/57, pulse (!) 115, temperature (!) 97.2 F (36.2 C), resp. rate (!) 27, height _0  (1.626 m), weight 95.4 kg, SpO2 (!) 71 %.    Vent Mode: PRVC FiO2 (%):  [40 %-100 %] 40 % Set Rate:  [26 bmp] 26 bmp Vt Set:  [440 mL] 440 mL PEEP:  [5 cmH20] 5 cmH20 Plateau Pressure:  [17 cmH20-20 cmH20] 20 cmH20   Intake/Output Summary (Last 24 hours) at 05/09/2019 0738 Last data filed at 05/09/2019 0700 Gross per 24 hour  Intake 5502.91 ml  Output 740 ml  Net 4762.91 ml   Filed Weights   05/08/19 1802 05/09/19 0415  Weight: 93.9 kg 95.4 kg    Examination: General: no acute distress, unresponsive on vent HEENT: NCAT, PERRLA, MMMP Lungs: CTA, no wheezes, rhonchi, rales Cardiovascular: RRR, no m/g/r Abdomen: soft, NT,ND, BS+ Extremities: no c/c/e Skin: no  rashes, warm and dry Neuro: unresponsive on sedation on vent GU: foley in place  Resolved Hospital Problem list   none  Assessment & Plan:  Hemorrhagic shock: secondary to gastrointestinal hemorrhage. S/p 2 units PRBC in ED without resolution and started on norepinephrine. Kcentra given in ED for Xarelto reversal. Last dose of Xarelto was 7/6.  - Transfuse and pressors to keep MAP > 31mHg -on neo gtt at this time with afib with rvr. Suspect now that hgb has improved this is 2/2 sedation and dilt. Will wean propofol once endoscopy  complete.  - s/p trasnfusion hgb now at 10 - Trend H&H q6, Coags ok -ct negative for source of bleeding -for cscope later today Atrial fibrillation with rvr: currently AF rate 100 on tele with dilt gtt at 5 -on dilt gtt despite hypotension - Telemetry monitoring. - Holding home Xarelto   Gastrointestinal bleed: Location unclear. Melena x 5 days. Recent diagnosis of NASH cirrhosis. Unknown is she has varices.  - GI planning scope. Will intubate patient for EGD with plans for extubation after.  - Protonix BID NASH cirrhosis: Tbili 0.6, INR 1.9. Recent diagnosis. [facility] following.   Acute resp failure hypoxic:  -s/p ett -suspect needed 2/2 wob for critical illness and hemodynamic instability -wean vent once endoscopy complete   Lactic acidosis:  -improving 150.0->9.3ARF with metabolic acidosis: -Cr baseline ~1.3 -currently 39/1.75 -likely 2/2 shock, follow uop and trend -on bicarb gtt  Prediabetes:  -follow blood glucose   Best practice:  Diet: NPO Pain/Anxiety/Delirium protocol (if indicated): pad protocol VAP protocol (if indicated): per protocol DVT prophylaxis: SCD GI prophylaxis: PPI infusion Glucose control: NA Mobility: BR Code Status: FULL Family Communication: family 7/9 Disposition: ICU  Labs   CBC: Recent Labs  Lab 05/08/19 1751 05/09/19 0025 05/09/19 0115 05/09/19 0349 05/09/19 0353  WBC 14.0* 15.3*  --  18.1*  --   NEUTROABS 11.4*  --   --   --   --   HGB 4.9* 10.0* 10.2* 10.4* 10.2*  HCT 17.7* 31.6* 30.0* 31.3* 30.0*  MCV 95.7 90.0  --  86.2  --   PLT 360 327  310  --  338  --     Basic Metabolic Panel: Recent Labs  Lab 05/08/19 1751 05/09/19 0115 05/09/19 0349 05/09/19 0353  NA 144 146* 144 146*  K 4.5 4.0 4.2 3.9  CL 117*  --  111  --   CO2 10*  --  15*  --   GLUCOSE 182*  --  144*  --   BUN 37*  --  39*  --   CREATININE 1.67*  --  1.75*  --   CALCIUM 8.9  --  7.6*  --   MG  --   --  2.1  --   PHOS  --    --  6.7*  --    GFR: Estimated Creatinine Clearance: 34.5 mL/min (A) (by C-G formula based on SCr of 1.75 mg/dL (H)). Recent Labs  Lab 05/08/19 1751 05/08/19 2010 05/09/19 0025 05/09/19 0349  WBC 14.0*  --  15.3* 18.1*  LATICACIDVEN  --  10.9* 6.6*  --     Liver Function Tests: Recent Labs  Lab 05/08/19 1751  AST 29  ALT 14  ALKPHOS 93  BILITOT 0.6  PROT 6.6  ALBUMIN 3.1*   No results for input(s): LIPASE, AMYLASE in the last 168 hours. No results for input(s): AMMONIA in the last 168 hours.  ABG  Component Value Date/Time   PHART 7.476 (H) 05/09/2019 0353   PCO2ART 23.1 (L) 05/09/2019 0353   PO2ART 151.0 (H) 05/09/2019 0353   HCO3 17.0 (L) 05/09/2019 0353   TCO2 18 (L) 05/09/2019 0353   ACIDBASEDEF 5.0 (H) 05/09/2019 0353   O2SAT 100.0 05/09/2019 0353     Coagulation Profile: Recent Labs  Lab 05/08/19 1751 05/08/19 2140 05/09/19 0025 05/09/19 0349  INR 1.9* 1.6* 1.4* 1.5*    Cardiac Enzymes: No results for input(s): CKTOTAL, CKMB, CKMBINDEX, TROPONINI in the last 168 hours.  HbA1C: Hgb A1c MFr Bld  Date/Time Value Ref Range Status  10/10/2012 06:25 AM 5.8 (H) <5.7 % Final    Comment:    (NOTE)                                                                       According to the ADA Clinical Practice Recommendations for 2011, when HbA1c is used as a screening test:  >=6.5%   Diagnostic of Diabetes Mellitus           (if abnormal result is confirmed) 5.7-6.4%   Increased risk of developing Diabetes Mellitus References:Diagnosis and Classification of Diabetes Mellitus,Diabetes MWUX,3244,01(UUVOZ 1):S62-S69 and Standards of Medical Care in         Diabetes - 2011,Diabetes DGUY,4034,74 (Suppl 1):S11-S61.    CBG: Recent Labs  Lab 05/08/19 1736 05/08/19 2220 05/09/19 0029 05/09/19 0351  GLUCAP 166* 128* 175* 154*      Critical care time: The patient is critically ill with multiple organ systems failure and requires high complexity  decision making for assessment and support, frequent evaluation and titration of therapies, application of advanced monitoring technologies and extensive interpretation of multiple databases.  Critical care time 41 mins. This represents my time independent of the NPs time taking care of the pt. This is excluding procedures.     Audria Nine DO Pager: [phone] After hours pager: [phone]  [facility] 05/09/2019, 7:38 AM

## 2019-05-09 NOTE — Interval H&P Note (Signed)
History and Physical Interval Note:  05/09/2019 12:42 PM  Erin Holt  has presented today for surgery, with the diagnosis of hematochezia.  The various methods of treatment have been discussed with the patient and family. After consideration of risks, benefits and other options for treatment, the patient has consented to  Procedure(s): COLONOSCOPY WITH PROPOFOL (N/A) as a surgical intervention.  The patient's history has been reviewed, patient examined, no change in status, stable for surgery.  I have reviewed the patient's chart and labs.  Questions were answered to the patient's satisfaction.     Nancy Fetter

## 2019-05-09 NOTE — Procedures (Signed)
Arterial Catheter Insertion Procedure Note Erin Holt [id number] 1951/09/14  Procedure: Insertion of Arterial Catheter  Indications: Blood pressure monitoring and Frequent blood sampling  Procedure Details Consent: Unable to obtain consent because of altered level of consciousness. Time Out: Verified patient identification, verified procedure, site/side was marked, verified correct patient position, special equipment/implants available, medications/allergies/relevent history reviewed, required imaging and test results available.  Performed  Maximum sterile technique was used including antiseptics, cap, gloves, gown, hand hygiene, mask and sheet. Skin prep: Chlorhexidine; local anesthetic administered 22 gauge catheter was inserted into left radial artery using the Seldinger technique. ULTRASOUND GUIDANCE USED: NO Evaluation Blood flow good; BP tracing good. Complications: No apparent complications.   Mariam Dollar 05/09/2019

## 2019-05-09 NOTE — Procedures (Signed)
Central Venous Catheter Insertion Procedure Note Erin Holt [id number] 06/08/1951  Procedure: Insertion of Central Venous Catheter Indications: Assessment of intravascular volume, Drug and/or fluid administration and Frequent blood sampling  Procedure Details Consent: Risks of procedure as well as the alternatives and risks of each were explained to the (patient/caregiver).  Consent for procedure obtained. Time Out: Verified patient identification, verified procedure, site/side was marked, verified correct patient position, special equipment/implants available, medications/allergies/relevent history reviewed, required imaging and test results available.  Performed  Maximum sterile technique was used including antiseptics, cap, gloves, gown, hand hygiene, mask and sheet. Skin prep: Chlorhexidine; local anesthetic administered A antimicrobial bonded/coated triple lumen catheter was placed in the left internal jugular vein using the Seldinger technique. Catheter placed to 20 cm. Blood aspirated via all 3 ports and then flushed x 3. Line sutured x 2 and dressing applied.  Ultrasound guidance used.Yes.    Evaluation Blood flow good Complications: No apparent complications Patient did tolerate procedure well. Chest X-ray ordered to verify placement.  CXR: pending.   Georgann Housekeeper, AGACNP-BC [place] Pager [phone] or [phone]  05/09/2019 12:20 AM

## 2019-05-09 NOTE — Op Note (Addendum)
[facility] Patient Name: Erin Holt Procedure Date : 05/09/2019 MRN: [id number] Attending MD: Nancy Fetter Dr., MD Date of Birth: [birth year] CSN: [id number] Age: 68 Admit Type: Inpatient Procedure:                Colonoscopy Indications:              Hematochezia, patient had been on Xarelto                            apparently stopped a couple days ago. She has been                            passing maroon stool. EGD a Dr Therisa Doyne yesterday                            evening showed no bleeding small area was clipped.                            She has been prep with NG tube. She is intubated                            and on a propofol drip for sedation. Providers:                Joyice Faster. Oletta Lamas, MD, Glori Bickers, RN, Cletis Athens, Technician Referring MD:              Medicines:                Propofol infusion  mcg/kg/min IV Complications:            No immediate complications. Estimated Blood Loss:     Estimated blood loss: none. Procedure:                Pre-Anesthesia Assessment:                           - Prior to the procedure, a History and Physical                            was performed, and patient medications and                            allergies were reviewed. The patient's tolerance of                            previous anesthesia was also reviewed. The risks                            and benefits of the procedure and the sedation                            options and risks were discussed with the patient.  All questions were answered, and informed consent                            was obtained. Prior Anticoagulants: The patient has                            taken Xarelto (rivaroxaban), last dose was 3 days                            prior to procedure. ASA Grade Assessment: IV - A                            patient with severe systemic disease that is a   constant threat to life. After reviewing the risks                            and benefits, the patient was deemed in                            satisfactory condition to undergo the procedure.                           After obtaining informed consent, the colonoscope                            was passed under direct vision. Throughout the                            procedure, the patient's blood pressure, pulse, and                            oxygen saturations were monitored continuously. The                            CF-HQ190L ([id number]) Olympus colonoscope was                            introduced through the anus and advanced to the the                            terminal ileum. The colonoscopy was performed                            without difficulty. The patient tolerated the                            procedure well. The quality of the bowel                            preparation was good. The terminal ileum, the                            ileocecal valve and the rectum were photographed. Scope In: 11:56:49 AM Scope Out: 12:17:20  PM Scope Withdrawal Time: 0 hours 7 minutes 37 seconds  Total Procedure Duration: 0 hours 20 minutes 31 seconds  Findings:      The terminal ileum appeared normal. There was no signs of blood in the       terminal ileum for approximately 5 cm      There is no endoscopic evidence of bleeding, diverticula, mass, polyps       or ulcerations in the entire colon.      The retroflexed view of the distal rectum and anal verge was normal and       showed no anal or rectal abnormalities. Impression:               - The examined portion of the ileum was normal.                           - The distal rectum and anal verge are normal on                            retroflexion view.                           - No specimens collected.                           - Blood in stool without cause found on endoscopic                            exam Recommendation:            - Clear liquid diet.                           - Do a GI bleeding (tagged RBC) scan. We will order                            that if the patient rebleeds.                           - Would wean from ventilator and follow clinically Procedure Code(s):        --- Professional ---                           [id number], Colonoscopy, flexible; diagnostic, including                            collection of specimen(s) by brushing or washing,                            when performed (separate procedure) Diagnosis Code(s):        --- Professional ---                           K92.1, Melena (includes Hematochezia) CPT copyright 2019 American Medical Association. All rights reserved. The codes documented in this report are preliminary and upon coder review may  be revised to meet current compliance requirements. Nancy Fetter Dr., MD 05/09/2019 1:00:09 PM This report  has been signed electronically. Number of Addenda: 0

## 2019-05-09 NOTE — Progress Notes (Signed)
 Initial Nutrition Assessment  DOCUMENTATION CODES:   Obesity unspecified  INTERVENTION:   If remains on vent support, recommend initiation of Enteral Nutrition as soon as acute GI bleeding resolves  Tube Feeding:  Vital High Protein at 35 ml/hr Pro-Stat 30 mL TID Provides 119 g of protein, 1140 kcals and 706 mL of free water Meets 100% estimated protein and calorie needs  TF regimen and propofol at current rate providing 1438 total kcal/day (109 % of kcal needs)   NUTRITION DIAGNOSIS:   Inadequate oral intake related to acute illness as evidenced by NPO status.  GOAL:   Patient will meet greater than or equal to 90% of their needs  MONITOR:   Vent status, Labs, Weight trends, Skin  REASON FOR ASSESSMENT:   Ventilator    ASSESSMENT:   68 yo female with recent dx of NASH cirrhosis admitted with admitted with massive GI bleed and hemorrhagic shock, severe metabolic acidosis, AKI. PMH includes HTN, stroke, A.fib on Xarelto  7/08 EGD: no evidence of UGI bleeding; few gastric erosions with 1 endoclip applied  Plan colonoscopy today  Patient is currently intubated on ventilator support, off levophed, phenylephrine for pressor support MV: 11.5 L/min Temp (24hrs), Avg:98 F (36.7 C), Min:95.9 F (35.5 C), Max:99 F (37.2 C)  Propofol: 11.3 ml/hr  Unable to obtain diet and weight history at this tie  Net +4.7 L, current wt 95 kg, weight yesterday 93.9 kg. Noted weight of 87 kg in April. Unsure of EDW at present  +bloody BM this AM, Hgb 10.2  Labs: sodium 146, Creatinine 1.75, BUN 39, phosphorus 6.7 (H) Meds: lasix, lactulose, sodium bicarb drip at 150 ml/hr  NUTRITION - FOCUSED PHYSICAL EXAM:  Unable to assess, pt in procedure  Diet Order:   Diet Order            Diet NPO time specified  Diet effective now              EDUCATION NEEDS:   Not appropriate for education at this time  Skin:  Skin Assessment: Reviewed RN Assessment  Last BM:  7/09  bloody  Height:   Ht Readings from Last 1 Encounters:  05/09/19 5\' 4"  (1.626 m)    Weight:   Wt Readings from Last 1 Encounters:  05/09/19 95 kg    Ideal Body Weight:  54.5 kg  BMI:  Body mass index is 35.95 kg/m.  Estimated Nutritional Needs:   Kcal:  1050-1320 kcals  Protein:  110-135 g  Fluid:  >/= 1.5 L     Maurice Ramseur MS, RDN, LDN, CNSC [phone] Pager  [phone] Weekend/On-Call Pager

## 2019-05-09 NOTE — Progress Notes (Signed)
Due to the patient's cirrhosis she should probably be on antibiotics after procedures and for GI bleeding.  We will start on IV Cipro and hopefully when she is taking p.o. can change to oral.

## 2019-05-09 NOTE — Procedures (Signed)
Extubation Procedure Note  Patient Details:   Name: Erin Holt DOB: 1951/10/27 MRN: [id number]   Airway Documentation:  Airway 7.5 mm (Active)  Secured at (cm) 23 cm 05/09/19 1509  Measured From Lips 05/09/19 1509  Secured Location Right 05/09/19 1509  Secured By [employer] 05/09/19 1509  Tube Holder Repositioned Yes 05/09/19 0745  Site Condition Cool;Dry 05/09/19 1509   Vent end date: 05/09/19 Vent end time: 1615   Evaluation  O2 sats: stable throughout Complications: No apparent complications Patient did tolerate procedure well. Bilateral Breath Sounds: Clear   Yes  Earney Navy 05/09/2019, 4:17 PM

## 2019-05-09 NOTE — Progress Notes (Signed)
Pt now awake and beginning to follow commands.  Pt placed on PSV/CPAP mode 5/5 for weaning.

## 2019-05-09 NOTE — Op Note (Addendum)
[facility] Patient Name: Erin Holt Procedure Date : 05/08/2019 MRN: [id number] Attending MD: Ronnette Juniper , MD Date of Birth: Mar 20, 1951 CSN: [id number] Age: 68 Admit Type: Inpatient Procedure:                Upper GI endoscopy Indications:              Hematochezia, Melena Providers:                Ronnette Juniper, MD, Elmer Ramp. Tilden Dome, RN, Marguerita Merles, Technician Referring MD:              Medicines:                Monitored Anesthesia Care Complications:            No immediate complications. Estimated blood loss:                            None. Estimated Blood Loss:     Estimated blood loss: none. Procedure:                Pre-Anesthesia Assessment:                           - Prior to the procedure, a History and Physical                            was performed, and patient medications and                            allergies were reviewed. The patient's tolerance of                            previous anesthesia was also reviewed. The risks                            and benefits of the procedure and the sedation                            options and risks were discussed with the patient.                            All questions were answered, and informed consent                            was obtained. Prior Anticoagulants: The patient has                            taken Xarelto (rivaroxaban), last dose was 2 days                            prior to procedure. ASA Grade Assessment: IV - A  patient with severe systemic disease that is a                            constant threat to life. After reviewing the risks                            and benefits, the patient was deemed in                            satisfactory condition to undergo the procedure.                           After obtaining informed consent, the endoscope was                            passed under direct vision. Throughout the                       procedure, the patient's blood pressure, pulse, and                            oxygen saturations were monitored continuously. The                            [id number]-[id number] ([id number]) Olympus gastroscope was                            introduced through the mouth, and advanced to the                            second part of duodenum. The upper GI endoscopy was                            accomplished without difficulty. The patient                            tolerated the procedure well. Scope In: Scope Out: Findings:      The examined esophagus was normal.      The Z-line was regular and was found 40 cm from the incisors.      Localized mildly erythematous mucosa without bleeding was found in the       gastric antrum.      A few dispersed, diminutive non-bleeding erosions were found in the       gastric fundus and in the gastric antrum. There were no stigmata of       recent bleeding.      The examined duodenum was normal.      Minimal oozing was noted from the fundus on retroflexion, likely related       to scope trauma. One hemostatic clip was successfully placed. There was       no bleeding at the end of the procedure. Impression:               - Normal esophagus. No esophageal or gastric  varices noted.                           - Z-line regular, 40 cm from the incisors.                           - Erythematous mucosa in the antrum.                           - Non-bleeding erosive gastropathy.                           - Normal examined duodenum.                           - No specimens collected. Recommendation:           - Colonoscopy in am.                           Patient to remain intubated and prep to given via                            NG tube.                           Continue protonix 40 mg BID and stop octreotide.                           H and H monitoring and transfusion as needed to                            keep Hb above  7. Procedure Code(s):        --- Professional ---                           [phone], Esophagogastroduodenoscopy, flexible,                            transoral; with control of bleeding, any method Diagnosis Code(s):        --- Professional ---                           K31.89, Other diseases of stomach and duodenum                           K92.1, Melena (includes Hematochezia) CPT copyright 2019 American Medical Association. All rights reserved. The codes documented in this report are preliminary and upon coder review may  be revised to meet current compliance requirements. Ronnette Juniper, MD 05/08/2019 11:57:05 PM Number of Addenda: 0

## 2019-05-09 NOTE — Progress Notes (Signed)
Blood gas results called to [place]. RT will continue to monitor.

## 2019-05-10 ENCOUNTER — Inpatient Hospital Stay (HOSPITAL_COMMUNITY): Payer: Medicare Other

## 2019-05-10 DIAGNOSIS — M7989 Other specified soft tissue disorders: Secondary | ICD-10-CM

## 2019-05-10 DIAGNOSIS — I4891 Unspecified atrial fibrillation: Secondary | ICD-10-CM

## 2019-05-10 LAB — COMPREHENSIVE METABOLIC PANEL
ALT: 25 U/L (ref 0–44)
AST: 52 U/L — ABNORMAL HIGH (ref 15–41)
Albumin: 2.5 g/dL — ABNORMAL LOW (ref 3.5–5.0)
Alkaline Phosphatase: 77 U/L (ref 38–126)
Anion gap: 13 (ref 5–15)
BUN: 39 mg/dL — ABNORMAL HIGH (ref 8–23)
CO2: 26 mmol/L (ref 22–32)
Calcium: 7.2 mg/dL — ABNORMAL LOW (ref 8.9–10.3)
Chloride: 106 mmol/L (ref 98–111)
Creatinine, Ser: 1.78 mg/dL — ABNORMAL HIGH (ref 0.44–1.00)
GFR calc Af Amer: 33 mL/min — ABNORMAL LOW (ref >60)
GFR calc non Af Amer: 29 mL/min — ABNORMAL LOW (ref >60)
Glucose, Bld: 119 mg/dL — ABNORMAL HIGH (ref 70–99)
Potassium: 2.8 mmol/L — ABNORMAL LOW (ref 3.5–5.1)
Sodium: 145 mmol/L (ref 135–145)
Total Bilirubin: 1.2 mg/dL (ref 0.3–1.2)
Total Protein: 5.2 g/dL — ABNORMAL LOW (ref 6.5–8.1)

## 2019-05-10 LAB — HEMOGLOBIN AND HEMATOCRIT, BLOOD
HCT: 26.8 % — ABNORMAL LOW (ref 36.0–46.0)
Hemoglobin: 8.5 g/dL — ABNORMAL LOW (ref 12.0–15.0)

## 2019-05-10 LAB — CBC
HCT: 25.7 % — ABNORMAL LOW (ref 36.0–46.0)
Hemoglobin: 8.3 g/dL — ABNORMAL LOW (ref 12.0–15.0)
MCH: 27.9 pg (ref 26.0–34.0)
MCHC: 32.3 g/dL (ref 30.0–36.0)
MCV: 86.2 fL (ref 80.0–100.0)
Platelets: 209 10*3/uL (ref 150–400)
RBC: 2.98 MIL/uL — ABNORMAL LOW (ref 3.87–5.11)
RDW: 14.8 % (ref 11.5–15.5)
WBC: 13.8 10*3/uL — ABNORMAL HIGH (ref 4.0–10.5)
nRBC: 1.5 % — ABNORMAL HIGH (ref 0.0–0.2)

## 2019-05-10 LAB — MAGNESIUM: Magnesium: 1.9 mg/dL (ref 1.7–2.4)

## 2019-05-10 LAB — PHOSPHORUS: Phosphorus: 3.6 mg/dL (ref 2.5–4.6)

## 2019-05-10 LAB — GLUCOSE, CAPILLARY: Glucose-Capillary: 115 mg/dL — ABNORMAL HIGH (ref 70–99)

## 2019-05-10 LAB — PROTIME-INR
INR: 1.3 — ABNORMAL HIGH (ref 0.8–1.2)
Prothrombin Time: 16.4 seconds — ABNORMAL HIGH (ref 11.4–15.2)

## 2019-05-10 MED ORDER — POTASSIUM CHLORIDE 10 MEQ/50ML IV SOLN
10.0000 meq | INTRAVENOUS | Status: AC
  Administered 2019-05-10 (×6): 10 meq via INTRAVENOUS
  Filled 2019-05-10 (×6): qty 50

## 2019-05-10 MED ORDER — MAGNESIUM SULFATE 2 GM/50ML IV SOLN
2.0000 g | Freq: Once | INTRAVENOUS | Status: AC
  Administered 2019-05-10: 07:00:00 2 g via INTRAVENOUS
  Filled 2019-05-10: qty 50

## 2019-05-10 MED ORDER — PANTOPRAZOLE SODIUM 40 MG PO TBEC
40.0000 mg | DELAYED_RELEASE_TABLET | Freq: Every day | ORAL | Status: DC
  Administered 2019-05-11 – 2019-05-22 (×12): 40 mg via ORAL
  Filled 2019-05-10 (×12): qty 1

## 2019-05-10 MED ORDER — SODIUM CHLORIDE 0.9 % IV SOLN
2.0000 g | INTRAVENOUS | Status: DC
  Administered 2019-05-10 – 2019-05-12 (×3): 2 g via INTRAVENOUS
  Filled 2019-05-10: qty 20
  Filled 2019-05-10: qty 2
  Filled 2019-05-10: qty 20

## 2019-05-10 MED ORDER — SODIUM CHLORIDE 0.9 % IV BOLUS
1000.0000 mL | Freq: Once | INTRAVENOUS | Status: AC
  Administered 2019-05-10: 08:00:00 1000 mL via INTRAVENOUS

## 2019-05-10 MED ORDER — DILTIAZEM HCL ER COATED BEADS 240 MG PO CP24
240.0000 mg | ORAL_CAPSULE | Freq: Every day | ORAL | Status: DC
  Administered 2019-05-10 – 2019-05-22 (×13): 240 mg via ORAL
  Filled 2019-05-10 (×13): qty 1

## 2019-05-10 NOTE — Progress Notes (Signed)
Left upper extremity venous duplex completed. Preliminary results in Chart review CV Proc. [facility], [place] 7/10.2020, 11:00 AM

## 2019-05-10 NOTE — Progress Notes (Signed)
Left upper extremity arterial duplex completed. Preliminary results in Chart review CV Proc. [place] Erin Holt,RVS 05/10/2019, 11:14

## 2019-05-10 NOTE — Progress Notes (Signed)
Subjective: No further bleeding. Extubated.  Objective: Vital signs in last 24 hours: Temp:  [98.8 F (37.1 C)-100.9 F (38.3 C)] 99.9 F (37.7 C) (07/10 1200) Pulse Rate:  [100-131] 102 (07/10 1200) Resp:  [16-29] 26 (07/10 1200) BP: (92-129)/(56-116) 115/64 (07/10 1200) SpO2:  [86 %-100 %] 97 % (07/10 1200) Arterial Line BP: (95-125)/(47-88) 124/67 (07/10 0804) FiO2 (%):  [40 %] 40 % (07/09 1509) Weight change: 1.105 kg Last BM Date: 05/09/19  PE: GEN:  NAD, extubated ABD:  Soft, mild protuberant, non-tender  Lab Results: CBC    Component Value Date/Time   WBC 13.8 (H) 05/10/2019 0409   RBC 2.98 (L) 05/10/2019 0409   HGB 8.3 (L) 05/10/2019 0409   HCT 25.7 (L) 05/10/2019 0409   PLT 209 05/10/2019 0409   MCV 86.2 05/10/2019 0409   MCH 27.9 05/10/2019 0409   MCHC 32.3 05/10/2019 0409   RDW 14.8 05/10/2019 0409   LYMPHSABS 2.0 05/08/2019 1751   MONOABS 0.4 05/08/2019 1751   EOSABS 0.0 05/08/2019 1751   BASOSABS 0.0 05/08/2019 1751   CMP     Component Value Date/Time   NA 145 05/10/2019 0409   K 2.8 (L) 05/10/2019 0409   CL 106 05/10/2019 0409   CO2 26 05/10/2019 0409   GLUCOSE 119 (H) 05/10/2019 0409   BUN 39 (H) 05/10/2019 0409   CREATININE 1.78 (H) 05/10/2019 0409   CREATININE 0.99 10/17/2016 0957   CALCIUM 7.2 (L) 05/10/2019 0409   PROT 5.2 (L) 05/10/2019 0409   ALBUMIN 2.5 (L) 05/10/2019 0409   AST 52 (H) 05/10/2019 0409   ALT 25 05/10/2019 0409   ALKPHOS 77 05/10/2019 0409   BILITOT 1.2 05/10/2019 0409   GFRNONAA 29 (L) 05/10/2019 0409   GFRAA 33 (L) 05/10/2019 0409   Assessment:  1.  Overt obscure GI bleeding.  No clear if variceal; small mucosal irritated region clipped in stomach; colonoscopy no blood in colon.  Bleeding has resolved. 2.  Cirrhosis. 3.  Chronic anticoagulation.  Plan:  1.  10-day course antibiotics with gm-negative and anaerobic coverage for GI bleed in patient with cirrhosis. 2.  Pantoprazole 40 mg po qd. 3.  Clear liquid  diet, advance as tolerated. 4.  Ok to transition to less intense level of care from GI perspective. 5.  Would hold anticoagulants for at least the next one week, unless absolutely necessary otherwise. 6.  [facility] will follow.   Landry Dyke 05/10/2019, 12:56 PM   Cell [phone] If no answer or after 5 PM call [phone]

## 2019-05-10 NOTE — Evaluation (Signed)
Clinical/Bedside Swallow Evaluation Patient Details  Name: Erin Holt MRN: [id number] Date of Birth: 25-Jan-1951  Today's Date: 05/10/2019 Time: SLP Start Time (ACUTE ONLY): 9935 SLP Stop Time (ACUTE ONLY): 0845 SLP Time Calculation (min) (ACUTE ONLY): 8 min  Past Medical History:  Past Medical History:  Diagnosis Date  . Arthritis    "left knee" (10/09/2012)  . Atrial fibrillation ([place])   . Carotid artery occlusion    a. Carotid US (12/10/13):  R 60-79%; L 1-39% - f/u 6 mos  . Glaucoma (increased eye pressure)    "both eyes" (10/09/2012)  . Gout   . Hx of echocardiogram    a. Echo (09/2012):  Mild LVH, EF 55-65%, Gr 1 DD, MAC, mild MR, mild to mod LAE, mild RVE, PASP 44 mmHg  . Hypertension   . Stroke [facility]) 10/09/2012   Past Surgical History:  Past Surgical History:  Procedure Laterality Date  . BREAST BIOPSY  1980's   "left; it wasn't anything" (10/09/2012)  . COLONOSCOPY WITH PROPOFOL N/A 12/12/2016   Procedure: COLONOSCOPY WITH PROPOFOL;  Surgeon: Garlan Fair, MD;  Location: [facility];  Service: Endoscopy;  Laterality: N/A;  . ESOPHAGOGASTRODUODENOSCOPY (EGD) WITH PROPOFOL N/A 05/08/2019   Procedure: ESOPHAGOGASTRODUODENOSCOPY (EGD) WITH PROPOFOL;  Surgeon: Ronnette Juniper, MD;  Location: [facility];  Service: Gastroenterology;  Laterality: N/A;  . HEMOSTASIS CLIP PLACEMENT  05/08/2019   Procedure: HEMOSTASIS CLIP PLACEMENT;  Surgeon: Ronnette Juniper, MD;  Location: [facility];  Service: Gastroenterology;;  . IR PARACENTESIS  09/25/2018  . IR PARACENTESIS  10/16/2018  . KNEE ARTHROSCOPY W/ DEBRIDEMENT  2011   "left; for arthritis" (10/09/2012)   HPI:  68 year old female with very recent diagnosis of NASH cirrhosis presenting with 5 days of melena.  Hypotensive despite IV fluid and blood transfusion started on pressors. intubated 7/8-7/9. EGD showed normal esophagus.    Assessment / Plan / Recommendation Clinical Impression  Pt passed 3 oz water swallow. No  difficulty with liquids or solids. MD to advance diet. Will sign off.  SLP Visit Diagnosis: Dysphagia, unspecified (R13.10)    Aspiration Risk  Mild aspiration risk    Diet Recommendation Regular;Thin liquid   Liquid Administration via: Cup;Straw Medication Administration: Whole meds with liquid Supervision: Patient able to self feed    Other  Recommendations     Follow up Recommendations        Frequency and Duration            Prognosis        Swallow Study   General HPI: 68 year old female with very recent diagnosis of NASH cirrhosis presenting with 5 days of melena.  Hypotensive despite IV fluid and blood transfusion started on pressors. intubated 7/8-7/9. EGD showed normal esophagus.  Type of Study: Bedside Swallow Evaluation Diet Prior to this Study: NPO Temperature Spikes Noted: No Respiratory Status: Nasal cannula History of Recent Intubation: Yes Length of Intubations (days): 2 days Date extubated: 05/10/19 Behavior/Cognition: Alert;Cooperative;Pleasant mood Oral Care Completed by SLP: No Oral Cavity - Dentition: Adequate natural dentition Vision: Functional for self-feeding Self-Feeding Abilities: Able to feed self Patient Positioning: Upright in bed Baseline Vocal Quality: Normal Volitional Cough: Strong Volitional Swallow: Able to elicit    Oral/Motor/Sensory Function Overall Oral Motor/Sensory Function: Within functional limits   Ice Chips Ice chips: Not tested   Thin Liquid Thin Liquid: Within functional limits Presentation: Cup;Straw    Nectar Thick Nectar Thick Liquid: Not tested   Honey Thick Honey Thick Liquid: Not tested   Puree Puree:  Within functional limits   Solid     Solid: Within functional limits     Herbie Baltimore, MA [place] Pager [phone] Office [phone]  Lynann Beaver 05/10/2019,8:47 AM

## 2019-05-10 NOTE — Progress Notes (Addendum)
NAME:  Erin Holt, MRN:  [id number], DOB:  Nov 13, 1950, LOS: 2 ADMISSION DATE:  05/08/2019, CONSULTATION DATE:  7/8 REFERRING MD:  Dr Alcario Drought, TRH, CHIEF COMPLAINT:  Hemorrhagic  Brief History   68 year old female with very recent diagnosis of NASH cirrhosis presenting with 5 days of melena.  Hypotensive despite IV fluid and blood transfusion started on pressors.  History of present illness   68 year old female with past medical history as below, which is significant for persistent atrial fibrillation on Xarelto, hypertension, stroke, carotid artery occlusion, and a very recent diagnosis of NASH cirrhosis.  She has not yet been able to have a full work-up of this yet.  She started having melena on approximately 7/3 which has been persistent since that time up until her presentation to the emergency room on 7/8.  She was found to be hypotensive with blood pressure 86/59 which did respond well to 1 unit PRBC up to systolic pressure of 093.  Initial CBC with hemoglobin of 4.9.  Despite 2 units of PRBC, 1.5 L of IVF, and Kcentra (last Xarelto dose 48 hours ago), she continued to be hypotensive. Lactic acid 5. INR 1.9. PCCM consulted for ICU admission.   Past Medical History   Past Medical History:  Diagnosis Date  . Arthritis    "left knee" (10/09/2012)  . Atrial fibrillation ([place])   . Carotid artery occlusion    a. Carotid US (12/10/13):  R 60-79%; L 1-39% - f/u 6 mos  . Glaucoma (increased eye pressure)    "both eyes" (10/09/2012)  . Gout   . Hx of echocardiogram    a. Echo (09/2012):  Mild LVH, EF 55-65%, Gr 1 DD, MAC, mild MR, mild to mod LAE, mild RVE, PASP 44 mmHg  . Hypertension   . Stroke ([place]) 10/09/2012    Significant Hospital Events   7/8 admit to ICU 7/8: upper GI: clipping  7/9: lower GI: negative  Consults:  GI  Procedures:  7/8 ett -> 7/9 7/9 cvc  Significant Diagnostic Tests:  7/8 CT abd/pelvis: 1. No findings to localize source of GI bleed. 2. Moderate  volume ascites in the abdomen and pelvis, diminished fluid volume from December 2019 CT. Fluid appears slightly heterogeneous and greater density than seen with simple ascites. Findings may reflect complex ascites/peritonitis, however an element of hemorrhage is also considered without identifiable source. 3. Cirrhotic hepatic morphology. Possible sludge in the gallbladder and gallbladder wall thickening. 4. Left pleural effusion is partially included. Bilateral lower lobe atelectasis.  Micro Data:  7/8 sars2: neg  Antimicrobials:    Interim history/subjective:  7/9: intubated overnight with cvc placed, for c-scope today 7/10: extubated yesterday and doing well from that standpoint, no further overt bleeding noted but hgb is trending downward. L arm swelling more from IV infiltration on 7/8 and lgt to 100.6 overnight.   Objective   Blood pressure 92/67, pulse (!) 113, temperature (!) 100.6 F (38.1 C), resp. rate 17, height _0  (1.626 m), weight 95 kg, SpO2 100 %.    Vent Mode: PRVC FiO2 (%):  [40 %] 40 % Set Rate:  [26 bmp] 26 bmp Vt Set:  [440 mL] 440 mL PEEP:  [5 cmH20] 5 cmH20 Plateau Pressure:  [16 cmH20-19 cmH20] 16 cmH20   Intake/Output Summary (Last 24 hours) at 05/10/2019 0732 Last data filed at 05/10/2019 0500 Gross per 24 hour  Intake 2566.35 ml  Output 1140 ml  Net 1426.35 ml   Filed Weights   05/08/19 1802 05/09/19  8119 05/09/19 1125  Weight: 93.9 kg 95.4 kg 95 kg    Examination: General: no acute distress, unresponsive on vent HEENT: NCAT, PERRLA, MMMP Lungs: CTA, no wheezes, rhonchi, rales Cardiovascular: RRR, no m/g/r Abdomen: soft, NT,ND, BS+ Extremities: no c/c/e Skin: no rashes, warm and dry Neuro: unresponsive on sedation on vent GU: foley in place  Resolved Hospital Problem list   none  Assessment & Plan:  Hemorrhagic shock: secondary to gastrointestinal hemorrhage. S/p 2 units PRBC in ED without resolution and started on norepinephrine.  Kcentra given in ED for Xarelto reversal. Last dose of Xarelto was 7/6.  - off pressors as of 7/9 afternoon - s/p transfusions hgb slow downward trend to 8.3 from 10 today.  - Trend H&H q6, Coags ok -ct negative for source of bleeding -cscope negative 7/9 -upper endo negative for varices, 1 clip at oozing area in fundus -if rebleeds warrants tagged RBC scan Atrial fibrillation with rvr: currently AF rate 110's on tele with dilt gtt at 10 -will restart PO dilt once po intake ok with GI - Telemetry monitoring. - Holding home Xarelto   Gastrointestinal bleed: Location unclear. Melena x 5 days. Recent diagnosis of NASH cirrhosis.  - Protonix BID  NASH cirrhosis: Tbili 0.6, INR 1.9. Recent diagnosis. Followed by [facility] - GI following.     L arm IV infiltration -swelling worsening and pt complains of pain (now improved) -since cva always has cooler extremity, and numbness (this is not new)  - arterial duplex/venous duplex ordered -may warrant CT arm pending these studies.  -lgt overnight as well, follow closely   Acute resp failure hypoxic:  -resolved   Lactic acidosis:  -improving 14.7->8.2 ARF with metabolic acidosis: -Cr baseline ~1.3 -currently 39/1.78 -likely 2/2 shock, follow uop and trend (marginal and stable) -will slow bolus today.  -resolved acidosis, bicarb gtt off  Hypokalemia:  -replace -75mq x6 today   Prediabetes:  -follow blood glucose   Best practice:  Diet: advance as tolerated when GI ok.  Pain/Anxiety/Delirium protocol (if indicated): none VAP protocol (if indicated): n/a DVT prophylaxis: SCD GI prophylaxis: PPI  Glucose control: N/a Mobility: BR Code Status: FULL Family Communication: pt updated 7/10 Disposition: ICU  Labs   CBC: Recent Labs  Lab 05/08/19 1751 05/09/19 0025  05/09/19 0349 05/09/19 0353 05/09/19 1430 05/09/19 2130 05/10/19 0409  WBC 14.0* 15.3*  --  18.1*  --  17.2* 13.4* 13.8*  NEUTROABS 11.4*  --   --   --    --   --   --   --   HGB 4.9* 10.0*   < > 10.4* 10.2* 9.5* 8.5* 8.3*  HCT 17.7* 31.6*   < > 31.3* 30.0* 28.5* 26.1* 25.7*  MCV 95.7 90.0  --  86.2  --  84.6 84.5 86.2  PLT 360 327  310  --  338  --  280 226 209   < > = values in this interval not displayed.    Basic Metabolic Panel: Recent Labs  Lab 05/08/19 1751 05/09/19 0115 05/09/19 0349 05/09/19 0353 05/10/19 0409  NA 144 146* 144 146* 145  K 4.5 4.0 4.2 3.9 2.8*  CL 117*  --  111  --  106  CO2 10*  --  15*  --  26  GLUCOSE 182*  --  144*  --  119*  BUN 37*  --  39*  --  39*  CREATININE 1.67*  --  1.75*  --  1.78*  CALCIUM 8.9  --  7.6*  --  7.2*  MG  --   --  2.1  --  1.9  PHOS  --   --  6.7*  --  3.6   GFR: Estimated Creatinine Clearance: 33.8 mL/min (A) (by C-G formula based on SCr of 1.78 mg/dL (H)). Recent Labs  Lab 05/08/19 2010 05/09/19 0025 05/09/19 0349 05/09/19 0752 05/09/19 1052 05/09/19 1430 05/09/19 2130 05/10/19 0409  WBC  --  15.3* 18.1*  --   --  17.2* 13.4* 13.8*  LATICACIDVEN 10.9* 6.6*  --  4.2* 3.2*  --   --   --     Liver Function Tests: Recent Labs  Lab 05/08/19 1751 05/10/19 0409  AST 29 52*  ALT 14 25  ALKPHOS 93 77  BILITOT 0.6 1.2  PROT 6.6 5.2*  ALBUMIN 3.1* 2.5*   No results for input(s): LIPASE, AMYLASE in the last 168 hours. No results for input(s): AMMONIA in the last 168 hours.  ABG    Component Value Date/Time   PHART 7.476 (H) 05/09/2019 0353   PCO2ART 23.1 (L) 05/09/2019 0353   PO2ART 151.0 (H) 05/09/2019 0353   HCO3 17.0 (L) 05/09/2019 0353   TCO2 18 (L) 05/09/2019 0353   ACIDBASEDEF 5.0 (H) 05/09/2019 0353   O2SAT 100.0 05/09/2019 0353     Coagulation Profile: Recent Labs  Lab 05/08/19 1751 05/08/19 2140 05/09/19 0025 05/09/19 0349 05/10/19 0409  INR 1.9* 1.6* 1.4* 1.5* 1.3*    Cardiac Enzymes: No results for input(s): CKTOTAL, CKMB, CKMBINDEX, TROPONINI in the last 168 hours.  HbA1C: Hgb A1c MFr Bld  Date/Time Value Ref Range Status   10/10/2012 06:25 AM 5.8 (H) <5.7 % Final    Comment:    (NOTE)                                                                       According to the ADA Clinical Practice Recommendations for 2011, when HbA1c is used as a screening test:  >=6.5%   Diagnostic of Diabetes Mellitus           (if abnormal result is confirmed) 5.7-6.4%   Increased risk of developing Diabetes Mellitus References:Diagnosis and Classification of Diabetes Mellitus,Diabetes BJSE,8315,17(OHYWV 1):S62-S69 and Standards of Medical Care in         Diabetes - 2011,Diabetes PXTG,6269,48 (Suppl 1):S11-S61.    CBG: Recent Labs  Lab 05/08/19 1736 05/08/19 2220 05/09/19 0029 05/09/19 0351 05/10/19 0342  GLUCAP 166* 128* 175* 154* 115*      Critical care time: The patient is critically ill with multiple organ systems failure and requires high complexity decision making for assessment and support, frequent evaluation and titration of therapies, application of advanced monitoring technologies and extensive interpretation of multiple databases.  Critical care time 34 mins. This represents my time independent of the NPs time taking care of the pt. This is excluding procedures.     Audria Nine DO Pager: [phone] After hours pager: [phone]  [facility] 05/10/2019, 7:32 AM

## 2019-05-11 ENCOUNTER — Other Ambulatory Visit: Payer: Self-pay

## 2019-05-11 LAB — COMPREHENSIVE METABOLIC PANEL
ALT: 27 U/L (ref 0–44)
AST: 40 U/L (ref 15–41)
Albumin: 2.4 g/dL — ABNORMAL LOW (ref 3.5–5.0)
Alkaline Phosphatase: 77 U/L (ref 38–126)
Anion gap: 11 (ref 5–15)
BUN: 25 mg/dL — ABNORMAL HIGH (ref 8–23)
CO2: 24 mmol/L (ref 22–32)
Calcium: 7.7 mg/dL — ABNORMAL LOW (ref 8.9–10.3)
Chloride: 105 mmol/L (ref 98–111)
Creatinine, Ser: 1.25 mg/dL — ABNORMAL HIGH (ref 0.44–1.00)
GFR calc Af Amer: 51 mL/min — ABNORMAL LOW (ref >60)
GFR calc non Af Amer: 44 mL/min — ABNORMAL LOW (ref >60)
Glucose, Bld: 116 mg/dL — ABNORMAL HIGH (ref 70–99)
Potassium: 3.1 mmol/L — ABNORMAL LOW (ref 3.5–5.1)
Sodium: 140 mmol/L (ref 135–145)
Total Bilirubin: 0.9 mg/dL (ref 0.3–1.2)
Total Protein: 5.2 g/dL — ABNORMAL LOW (ref 6.5–8.1)

## 2019-05-11 LAB — CBC
HCT: 27 % — ABNORMAL LOW (ref 36.0–46.0)
Hemoglobin: 8.4 g/dL — ABNORMAL LOW (ref 12.0–15.0)
MCH: 28.2 pg (ref 26.0–34.0)
MCHC: 31.1 g/dL (ref 30.0–36.0)
MCV: 90.6 fL (ref 80.0–100.0)
Platelets: 191 10*3/uL (ref 150–400)
RBC: 2.98 MIL/uL — ABNORMAL LOW (ref 3.87–5.11)
RDW: 14.9 % (ref 11.5–15.5)
WBC: 12.3 10*3/uL — ABNORMAL HIGH (ref 4.0–10.5)
nRBC: 1 % — ABNORMAL HIGH (ref 0.0–0.2)

## 2019-05-11 LAB — HEMOGLOBIN AND HEMATOCRIT, BLOOD
HCT: 26.6 % — ABNORMAL LOW (ref 36.0–46.0)
HCT: 31.4 % — ABNORMAL LOW (ref 36.0–46.0)
Hemoglobin: 8.4 g/dL — ABNORMAL LOW (ref 12.0–15.0)
Hemoglobin: 9.8 g/dL — ABNORMAL LOW (ref 12.0–15.0)

## 2019-05-11 LAB — MAGNESIUM: Magnesium: 2.6 mg/dL — ABNORMAL HIGH (ref 1.7–2.4)

## 2019-05-11 LAB — PHOSPHORUS: Phosphorus: 2.6 mg/dL (ref 2.5–4.6)

## 2019-05-11 MED ORDER — POTASSIUM CHLORIDE CRYS ER 20 MEQ PO TBCR
40.0000 meq | EXTENDED_RELEASE_TABLET | ORAL | Status: AC
  Administered 2019-05-11 (×2): 40 meq via ORAL
  Filled 2019-05-11 (×2): qty 2

## 2019-05-11 NOTE — Plan of Care (Signed)
  Problem: Clinical Measurements: Goal: Respiratory complications will improve Outcome: Completed/Met   Problem: Nutrition: Goal: Adequate nutrition will be maintained Outcome: Completed/Met   Problem: Coping: Goal: Level of anxiety will decrease Outcome: Completed/Met   Problem: Elimination: Goal: Will not experience complications related to bowel motility Outcome: Completed/Met   Problem: Pain Managment: Goal: General experience of comfort will improve Outcome: Completed/Met

## 2019-05-11 NOTE — Evaluation (Signed)
Physical Therapy Evaluation Patient Details Name: Erin Holt MRN: [id number] DOB: May 19, 1951 Today's Date: 05/11/2019   History of Present Illness  68 y.o. female with medical history significant of NASH cirrhosis just recently diagnosed, hasnt had EGD yet, HTN, A.Fib on Xarelto, prior stroke.Admitted 05/08/2019 with c/o black stool and dark red blood from rectum, recieved 1 unit PRBC in ED. Intubatd 7/9-7/08/2019. EGD showed gastric erosions. No varices. One clip placed. Upper and Lower GI negative for bleeding source.  Clinical Impression  PTA pt living alone in single story home with 1 step to enter. Pt independent with all mobility and iADLs working as Public affairs consultant at [employer]. Pt limited in safe mobility by decreased strength and endurance. Pt currently mod I for bed mobility, supervision for transfers and min guard for ambulation without AD. PT will continue to follow acutely to build strength, however pt will not need any additional PT services at discharge. Pt encouraged to call on friends for meals and to accompany her when she goes out until she feels stronger.     Follow Up Recommendations No PT follow up;Supervision - Intermittent    Equipment Recommendations  None recommended by PT    Recommendations for Other Services       Precautions / Restrictions Restrictions Weight Bearing Restrictions: No      Mobility  Bed Mobility Overal bed mobility: Modified Independent             General bed mobility comments: use of bed rail to come to EoB, slight dizziness with upright dissipated quickly   Transfers Overall transfer level: Needs assistance Equipment used: None Transfers: Sit to/from Stand Sit to Stand: Supervision         General transfer comment: supervision for safety, good power up and steadying in standing, minor dizziness that quickly dissipated   Ambulation/Gait Ambulation/Gait assistance: Min guard Gait Distance (Feet):  150 Feet Assistive device: None Gait Pattern/deviations: Step-through pattern;Decreased step length - right;Decreased step length - left Gait velocity: slowed Gait velocity interpretation: 1.31 - 2.62 ft/sec, indicative of limited community ambulator General Gait Details: hands on min guard progressing to min guard for slow, steady gait, distance limited by fatigue       Balance Overall balance assessment: Needs assistance Sitting-balance support: Feet supported;No upper extremity supported Sitting balance-Leahy Scale: Good     Standing balance support: No upper extremity supported;During functional activity Standing balance-Leahy Scale: Fair                               Pertinent Vitals/Pain Pain Assessment: Faces Pain Location: L forearm Pain Descriptors / Indicators: Pressure;Tightness Pain Intervention(s): Limited activity within patient's tolerance;Monitored during session;Patient requesting pain meds-RN notified    Home Living Family/patient expects to be discharged to:: Private residence Living Arrangements: Alone Available Help at Discharge: Friend(s);Available PRN/intermittently Type of Home: House Home Access: Stairs to enter   [employer] of Steps: 1 Home Layout: One level Home Equipment: None      Prior Function Level of Independence: Independent         Comments: working        Extremity/Trunk Assessment   Upper Extremity Assessment Upper Extremity Assessment: LUE deficits/detail LUE Deficits / Details: L arm and hand edema limiting ROM and grip strength    Lower Extremity Assessment Lower Extremity Assessment: Generalized weakness       Communication   Communication: No difficulties  Cognition Arousal/Alertness:  Awake/alert Behavior During Therapy: WFL for tasks assessed/performed Overall Cognitive Status: Within Functional Limits for tasks assessed                                        General  Comments General comments (skin integrity, edema, etc.): L arm edema and clear pustules, pt reports adverse reaction to IV. HR range from 88-128bpm throughout session         Assessment/Plan    PT Assessment Patient needs continued PT services  PT Problem List Decreased strength;Decreased activity tolerance;Decreased mobility       PT Treatment Interventions Gait training;Functional mobility training;Therapeutic activities;Therapeutic exercise;Balance training;Cognitive remediation;Patient/family education    PT Goals (Current goals can be found in the Care Plan section)  Acute Rehab PT Goals Patient Stated Goal: go home  PT Goal Formulation: With patient Time For Goal Achievement: 05/25/19 Potential to Achieve Goals: Good    Frequency Min 3X/week   Barriers to discharge Decreased caregiver support         AM-PAC PT "6 Clicks" Mobility  Outcome Measure Help needed turning from your back to your side while in a flat bed without using bedrails?: None Help needed moving from lying on your back to sitting on the side of a flat bed without using bedrails?: None Help needed moving to and from a bed to a chair (including a wheelchair)?: None Help needed standing up from a chair using your arms (e.g., wheelchair or bedside chair)?: None Help needed to walk in hospital room?: A Little Help needed climbing 3-5 steps with a railing? : A Little 6 Click Score: 22    End of Session Equipment Utilized During Treatment: Gait belt Activity Tolerance: Patient tolerated treatment well Patient left: in chair;with call bell/phone within reach Nurse Communication: Mobility status PT Visit Diagnosis: Other abnormalities of gait and mobility (R26.89);Muscle weakness (generalized) (M62.81);Difficulty in walking, not elsewhere classified (R26.2)    Time: 7014-1030 PT Time Calculation (min) (ACUTE ONLY): 20 min   Charges:   PT Evaluation $PT Eval Moderate Complexity: 1 Mod           [place] Woon B. Migdalia Dk PT, DPT Acute Rehabilitation Services Pager [phone] Office [phone]   [place] 05/11/2019, 1:48 PM

## 2019-05-11 NOTE — Progress Notes (Signed)
NAME:  Erin Holt, MRN:  [id number], DOB:  [birth year], LOS: 3 ADMISSION DATE:  05/08/2019, CONSULTATION DATE:  7/8 REFERRING MD:  Dr Alcario Drought, [facility], CHIEF COMPLAINT:  Hemorrhagic  Brief History   68 year old female with very recent diagnosis of NASH cirrhosis presenting with 5 days of hematochezia and melena.  Hypotensive despite IV fluid and blood transfusion started on pressors.  Past Medical History  Arthritis, Atrial fibrillation, Carotid artery occlusion, Glaucoma, Gout, Hypertension, Stroke (2013)  [facility] Events   7/8 Admit to ICU 7/8: Upper GI: No evidence of UGI bleeding, few gastric erosions with 1 endoclip applied  7/9: Lower GI: No endoscopic evidence of bleeding, diverticula, mass, polyps or ulcerations in the entire colon. Intubated overnight, CVC placed 7/10: Extubated, no further bleeding. GI recommending holding ac for at least 1 week   Consults:  GI  Procedures:  7/8 ETT -> 7/9 7/9 CVC >>  7/9 Art line >>7/10  Significant Diagnostic Tests:  7/8 CT abd/pelvis: 1. No findings to localize source of GI bleed. 2. Moderate volume ascites in the abdomen and pelvis, diminished fluid volume from December 2019 CT. Fluid appears slightly heterogeneous and greater density than seen with simple ascites. Findings may reflect complex ascites/peritonitis, however an element of hemorrhage is also considered without identifiable source. 3. Cirrhotic hepatic morphology. Possible sludge in the gallbladder and gallbladder wall thickening. 4. Left pleural effusion is partially included. Bilateral lower lobe atelectasis. 7/10: LUE Venous Duplex (preliminary): No evidence of DVT in upper extremities. Acute superficial vein thrombosis involving the left cephalic vein wrist to mid upper.   Micro Data:  7/8 SARS-CoV-2: NEG 7/8 MRSA by PCR: NEG  Antimicrobials:  Ceftriaxone 7/10 >>  Interim history/subjective:  Hemodynamically stable. H/H stable and no current  melena. I do not see any reason to keep her the ICU. Move to the progressive care unit   Objective   Blood pressure (Abnormal) 142/77, pulse (Abnormal) 109, temperature 99.9 F (37.7 C), resp. rate (Abnormal) 22, height 5\' 4"  (1.626 m), weight 103.5 kg, SpO2 98 %.        Intake/Output Summary (Last 24 hours) at 05/11/2019 0809 Last data filed at 05/11/2019 0752 Gross per 24 hour  Intake 1223.15 ml  Output 1053 ml  Net 170.15 ml   Filed Weights   05/09/19 0415 05/09/19 1125 05/11/19 0405  Weight: 95.4 kg 95 kg 103.5 kg    Examination: General: 68 year old pleasant female, sitting in recliner HEENT: [place]/AT. Moist mucous membranes  Lungs: Clear to auscultate, no wheezes, rhonchi, or rales Cardiovascular: Irregular, atrial fib, no murmurs, gaps, or rubs. All pulses palpable Abdomen: Soft, nontender, hypoactive bowel sounds Extremities: Left upper extremity swollen, otherwise no edema, good cap refill.  Skin: left upper extremity erythema, cool to touch, blistering Neuro: AOx4, moves all extremities GU: adequate urine output  Resolved Hospital Problem list   Acute resp failure hypoxic Hemorrhagic shock Lactic acidosis  Assessment & Plan:  Gastrointestinal bleed: Location unclear. Melena x 5 days. Recent diagnosis of NASH cirrhosis. Last dose of Xarelto 7/6  Plan: - Clear liquid diet, advance as tolerated - Protonix BID - Continue antibiotics; completing 10d course; now day 2 ctx.  - H/H check q12h - GI recommended tagged RBCs if patient rebleeds - [facility] following  Atrial fibrillation, hx of atrial fibrillation with RVR: Plan:  - Continue diltiazem  - Continue telemetry monitoring - Hold home Xarelto d/t bleeding   NASH cirrhosis:  - [facility] following  Acute Renal  Failure - Baseline Cr ~1.3 Plan:  - Trend Cr and urine output  - Avoid any nephrotoxic medications   L arm IV infiltration: - No evidence of DVT. Superficial vein thrombosis in left cephalic vein  near wrist  Plan:  - Warm compress - Keep left arm elevated   Chronic anticoagulation:  On Xarelto at home Plan: - Hold anticoagulants for at least the next week, d/t unclear etiology of bleeding  Hypokalemia:  Plan: - Trend BMP - Supplement K+  Prediabetes:  Plan:  Follow blood glucose    Best practice:  Diet: Clear thin liquids, advance as tolerated Pain/Anxiety/Delirium protocol (if indicated):  VAP protocol (if indicated): N/A DVT prophylaxis: SCDs  GI prophylaxis: PPI Glucose control: N/A Mobility: PT/OT  Code Status: FULL Family Communication: Last communicated on 7/10 Disposition: Progressive care   Patient is hemodynamically stable, off all pressors and oxygen. If rebleeding occurs, consider a tagged RBC scan. Continues to be hypokalemic, supplemented with 53meq  K+. PT/OT ordered. Diet advanced as tolerated. Continue current antibiotics. Continue to hold anticoagulation until 7/17. Patient is appropriate to leave the ICU and move to progressive care unit.       Erick Colace ACNP-BC [place] Pager # [phone] OR # [phone] if no answer

## 2019-05-11 NOTE — Progress Notes (Signed)
[facility] Progress Note  Subjective: Patient seen in follow-up today for gastrointestinal bleeding and cirrhosis of the liver most likely secondary to [facility] cirrhosis. Recent EGD showed gastric erosions. No varices. One clip placed. Colonoscopy negative for bleeding source. Patient does not want to take lactulose because it causes explosive stools.  Objective: Vital signs in last 24 hours: Temp:  [99.5 F (37.5 C)-100.2 F (37.9 C)] 99.9 F (37.7 C) (07/11 0700) Pulse Rate:  [93-127] 108 (07/11 0900) Resp:  [13-32] 25 (07/11 0900) BP: (82-142)/(55-113) 130/91 (07/11 0900) SpO2:  [78 %-100 %] 100 % (07/11 0900) Weight:  [103.5 kg] 103.5 kg (07/11 0405) Weight change: 8.5 kg   PE:  No distress  Abdomen soft nontender  Lab Results: Results for orders placed or performed during the hospital encounter of 05/08/19 (from the past 24 hour(s))  Hemoglobin and hematocrit, blood     Status: Abnormal   Collection Time: 05/10/19  1:20 PM  Result Value Ref Range   Hemoglobin 8.5 (L) 12.0 - 15.0 g/dL   HCT 26.8 (L) 36.0 - 46.0 %  Hemoglobin and hematocrit, blood     Status: Abnormal   Collection Time: 05/11/19  2:00 AM  Result Value Ref Range   Hemoglobin 8.4 (L) 12.0 - 15.0 g/dL   HCT 26.6 (L) 36.0 - 46.0 %  Comprehensive metabolic panel     Status: Abnormal   Collection Time: 05/11/19  4:02 AM  Result Value Ref Range   Sodium 140 135 - 145 mmol/L   Potassium 3.1 (L) 3.5 - 5.1 mmol/L   Chloride 105 98 - 111 mmol/L   CO2 24 22 - 32 mmol/L   Glucose, Bld 116 (H) 70 - 99 mg/dL   BUN 25 (H) 8 - 23 mg/dL   Creatinine, Ser 1.25 (H) 0.44 - 1.00 mg/dL   Calcium 7.7 (L) 8.9 - 10.3 mg/dL   Total Protein 5.2 (L) 6.5 - 8.1 g/dL   Albumin 2.4 (L) 3.5 - 5.0 g/dL   AST 40 15 - 41 U/L   ALT 27 0 - 44 U/L   Alkaline Phosphatase 77 38 - 126 U/L   Total Bilirubin 0.9 0.3 - 1.2 mg/dL   GFR calc non Af Amer 44 (L) >60 mL/min   GFR calc Af Amer 51 (L) >60 mL/min   Anion gap 11 5 - 15   Magnesium     Status: Abnormal   Collection Time: 05/11/19  4:02 AM  Result Value Ref Range   Magnesium 2.6 (H) 1.7 - 2.4 mg/dL  Phosphorus     Status: None   Collection Time: 05/11/19  4:02 AM  Result Value Ref Range   Phosphorus 2.6 2.5 - 4.6 mg/dL  CBC     Status: Abnormal   Collection Time: 05/11/19  4:02 AM  Result Value Ref Range   WBC 12.3 (H) 4.0 - 10.5 K/uL   RBC 2.98 (L) 3.87 - 5.11 MIL/uL   Hemoglobin 8.4 (L) 12.0 - 15.0 g/dL   HCT 27.0 (L) 36.0 - 46.0 %   MCV 90.6 80.0 - 100.0 fL   MCH 28.2 26.0 - 34.0 pg   MCHC 31.1 30.0 - 36.0 g/dL   RDW 14.9 11.5 - 15.5 %   Platelets 191 150 - 400 K/uL   nRBC 1.0 (H) 0.0 - 0.2 %    Studies/Results: Vas [place] Upper Extremity Arterial Duplex  Result Date: 05/10/2019 UPPER EXTREMITY DUPLEX STUDY Indications: Pain.  Risk Factors:  Hypertension. Other Factors: persistent A-Fib, History  of stroke, carotid artery occlusion,                recent arterial line. earlier IV infiltrate Limitations: Severe edema Comparison Study: No previous exam available for comparison Performing Technologist: Toma Copier RVS  Examination Guidelines: A complete evaluation includes B-mode imaging, spectral Doppler, color Doppler, and power Doppler as needed of all accessible portions of each vessel. Bilateral testing is considered an integral part of a complete examination. Limited examinations for reoccurring indications may be performed as noted.  Right Doppler Findings: +----------+----------+--------+-----------+--------+ Site      PSV (cm/s)WaveformPlaque     Comments +----------+----------+--------+-----------+--------+ Subclavian80        biphasicno stenosis         +----------+----------+--------+-----------+--------+ Axillary                                        +----------+----------+--------+-----------+--------+ Brachial                                        +----------+----------+--------+-----------+--------+ Radial                                           +----------+----------+--------+-----------+--------+ Left Doppler Findings: +----------+----------+--------+-----------+--------+ Site      PSV (cm/s)WaveformPlaque     Comments +----------+----------+--------+-----------+--------+ Subclavian60        biphasicno stenosis         +----------+----------+--------+-----------+--------+ Axillary  79        biphasicno stenosis         +----------+----------+--------+-----------+--------+ Brachial  66        biphasicno stenosis         +----------+----------+--------+-----------+--------+ Radial    49        biphasicno stenosis         +----------+----------+--------+-----------+--------+ Ulnar     49        biphasicno stenosis         +----------+----------+--------+-----------+--------+ Technically difficult due to extreme swelling. Biphasic Doppler waveforms may be secondary to edema and previous right sided stroke.  Summary:  Right: There is no evidence of a right subclavian obstruction. Left: No obstruction visualized in the left upper extremity. *See table(s) above for measurements and observations.    Preliminary    Vas [place] Upper Extremity Venous Duplex  Result Date: 05/10/2019 UPPER VENOUS STUDY  Indications: Pain, Edema, and Status post IV infiltrate Risk Factors: Persistent A-Fib on Xarelto, HTN,h/o/stroke. Comparison Study: No previous exam available for comparison. Performing Technologist: Toma Copier RVS  Examination Guidelines: A complete evaluation includes B-mode imaging, spectral Doppler, color Doppler, and power Doppler as needed of all accessible portions of each vessel. Bilateral testing is considered an integral part of a complete examination. Limited examinations for reoccurring indications may be performed as noted.  Right Findings: +----------+------------+---------+-----------+----------+-------+ RIGHT     CompressiblePhasicitySpontaneousPropertiesSummary  +----------+------------+---------+-----------+----------+-------+ Subclavian    Full       Yes       Yes                      +----------+------------+---------+-----------+----------+-------+  Left Findings: +----------+------------+---------+-----------+----------+---------------------+ LEFT      CompressiblePhasicitySpontaneousProperties       Summary        +----------+------------+---------+-----------+----------+---------------------+  IJV                                                  Unable to visualize                                                         due to IV line                                                               placement       +----------+------------+---------+-----------+----------+---------------------+ Subclavian    Full       Yes       Yes                                    +----------+------------+---------+-----------+----------+---------------------+ Axillary      Full       Yes       Yes                                    +----------+------------+---------+-----------+----------+---------------------+ Brachial      Full       Yes       Yes                                    +----------+------------+---------+-----------+----------+---------------------+ Radial        Full                                                        +----------+------------+---------+-----------+----------+---------------------+ Ulnar         Full                                                        +----------+------------+---------+-----------+----------+---------------------+ Cephalic      None                                    Non compressible                                                         wrist to mid upper  arm          +----------+------------+---------+-----------+----------+---------------------+ Basilic                                               Unable to visualize                                                         well enough to                                                            evaluate due to                                                              swelling        +----------+------------+---------+-----------+----------+---------------------+ Thrombus noted in the cepahalic vein wrist to mid upper at whoch point hthe vein becomes thrombus free and compressible. Unable to visualize the basilic due to swelling except for an area of the distal upper arm. The vein was non compressible but was probabblt due to the tighness of the arm.  Summary:  Right: No evidence of thrombosis in the subclavian.  Left: No evidence of deep vein thrombosis in the upper extremity. No evidence of thrombosis in the subclavian. Findings consistent with acute superficial vein thrombosis involving the left cephalic vein. See technician comments listed above.  *See table(s) above for measurements and observations.    Preliminary       Assessment: Erin Holt cirrhosis  GI bleed clinically seems to have resolved since there is no evidence of active bleeding according to the patient and hemoglobin and hematocrit are stable.    Plan:   Continue current management. Discontinue lactulose.    Cassell Clement 05/11/2019, 9:52 AM  Pager: [phone] If no answer or after 5 PM call [phone] Lab Results  Component Value Date   HGB 8.4 (L) 05/11/2019   HGB 8.4 (L) 05/11/2019   HGB 8.5 (L) 05/10/2019   HCT 27.0 (L) 05/11/2019   HCT 26.6 (L) 05/11/2019   HCT 26.8 (L) 05/10/2019   ALKPHOS 77 05/11/2019   ALKPHOS 77 05/10/2019   ALKPHOS 93 05/08/2019   AST 40 05/11/2019   AST 52 (H) 05/10/2019   AST 29 05/08/2019   ALT 27 05/11/2019   ALT 25 05/10/2019   ALT 14 05/08/2019

## 2019-05-11 NOTE — ED Provider Notes (Signed)
[facility] Provider Note   CSN: [id number] Arrival date & time: 05/08/19  1735     History   Chief Complaint Chief Complaint  Patient presents with   Rectal Bleeding   Shortness of Breath    HPI Erin Holt is a 68 y.o. female.         Rectal Bleeding Quality:  Maroon and black and tarry Amount:  Moderate Duration:  3 days Timing:  Constant Chronicity:  New Similar prior episodes: no   Relieved by:  Nothing Worsened by:  Nothing Ineffective treatments:  None tried Associated symptoms: abdominal pain, dizziness, hematemesis and light-headedness   Associated symptoms: no loss of consciousness   Risk factors: anticoagulant use and liver disease   Shortness of Breath Associated symptoms: abdominal pain     Past Medical History:  Diagnosis Date   Arthritis    "left knee" (10/09/2012)   Atrial fibrillation (HCC)    Carotid artery occlusion    a. Carotid US (12/10/13):  R 60-79%; L 1-39% - f/u 6 mos   Glaucoma (increased eye pressure)    "both eyes" (10/09/2012)   Gout    Hx of echocardiogram    a. Echo (09/2012):  Mild LVH, EF 55-65%, Gr 1 DD, MAC, mild MR, mild to mod LAE, mild RVE, PASP 44 mmHg   Hypertension    Stroke ([place]) 10/09/2012    Patient Active Problem List   Diagnosis Date Noted   GI bleed 05/08/2019   Acute blood loss anemia 05/08/2019   Hemorrhagic shock ([place]) 05/08/2019   Respiratory insufficiency    AKI (acute kidney injury) ([place]) 11/29/2018   Hyperkalemia 11/29/2018   Liver cirrhosis ([place]) 11/29/2018   Chronic atrial fibrillation 11/14/2018   Pulmonary HTN ([place]) 11/14/2018   Other ascites 11/14/2018   Permanent atrial fibrillation 08/16/2018   Dyslipidemia 08/16/2018   Bilateral carotid artery stenosis 02/14/2017   Sleep apnea 02/14/2017   Excessive daytime sleepiness 06/01/2015   Carotid stenosis 02/10/2014   HLD (hyperlipidemia) 02/10/2014   Paroxysmal atrial fibrillation  ([place]) 12/09/2013   Hypertension, essential 10/11/2012   Altered sensation due to recent cerebral infarction 10/11/2012   CVA (cerebral infarction) 10/09/2012   Elevated troponin 10/09/2012   Gout 10/09/2012   Glaucoma 10/09/2012   Obesity 10/09/2012   Disturbance of skin sensation 10/09/2012    Past Surgical History:  Procedure Laterality Date   BREAST BIOPSY  1980's   "left; it wasn't anything" (10/09/2012)   COLONOSCOPY WITH PROPOFOL N/A 12/12/2016   Procedure: COLONOSCOPY WITH PROPOFOL;  Surgeon: Garlan Fair, MD;  Location: [facility];  Service: Endoscopy;  Laterality: N/A;   ESOPHAGOGASTRODUODENOSCOPY (EGD) WITH PROPOFOL N/A 05/08/2019   Procedure: ESOPHAGOGASTRODUODENOSCOPY (EGD) WITH PROPOFOL;  Surgeon: Ronnette Juniper, MD;  Location: [facility];  Service: Gastroenterology;  Laterality: N/A;   HEMOSTASIS CLIP PLACEMENT  05/08/2019   Procedure: HEMOSTASIS CLIP PLACEMENT;  Surgeon: Ronnette Juniper, MD;  Location: [facility] ENDOSCOPY;  Service: Gastroenterology;;   IR PARACENTESIS  09/25/2018   IR PARACENTESIS  10/16/2018   KNEE ARTHROSCOPY W/ DEBRIDEMENT  2011   "left; for arthritis" (10/09/2012)     OB History   No obstetric history on file.      Home Medications    Prior to Admission medications   Medication Sig Start Date End Date Taking? Authorizing Provider  allopurinol (ZYLOPRIM) 100 MG tablet Take 100 mg by mouth every evening.   Yes [provider]  atorvastatin (LIPITOR) 40 MG tablet Take 40  mg by mouth every evening.  05/06/15  Yes [provider]  diltiazem (CARTIA XT) 240 MG 24 hr capsule Take 240 mg by mouth daily.   Yes [provider]  furosemide (LASIX) 20 MG tablet Take 40 mg by mouth daily.    Yes [provider]  lactulose (CHRONULAC) 10 GM/15ML solution Take 10-50 g by mouth See admin instructions. Take 10-50 grams (15-75 ml's) daily AS DIRECTED 11/06/18  Yes [provider]  latanoprost (XALATAN) 0.005 %  ophthalmic solution Place 1 drop into both eyes at bedtime.   Yes [provider]  rivaroxaban (XARELTO) 20 MG TABS tablet Take 1 tablet (20 mg total) by mouth daily with supper. 12/31/18  Yes Crenshaw, Denice Bors, MD  SIMBRINZA 1-0.2 % SUSP Place 1 drop into both eyes 2 (two) times daily.  03/28/18  Yes [provider]    Family History Family History  Problem Relation Age of Onset   Heart failure Mother 94       Died   Diabetes Mother    Diabetes Father    CAD Father 22    Social History Social History   Tobacco Use   Smoking status: Never Smoker   Smokeless tobacco: Never Used  Substance Use Topics   Alcohol use: No    Alcohol/week: 0.0 standard drinks   Drug use: No     Allergies   Patient has no known allergies.   Review of Systems Review of Systems  Respiratory: Positive for shortness of breath.   Gastrointestinal: Positive for abdominal pain, hematemesis and hematochezia.  Neurological: Positive for dizziness and light-headedness. Negative for loss of consciousness.  All other systems reviewed and are negative.    Physical Exam Updated Vital Signs BP 121/72    Pulse 93    Temp 100.2 F (37.9 C)    Resp 17    Ht 5' 4" (1.626 m)    Wt 95 kg    SpO2 (!) 88%    BMI 35.95 kg/m   Physical Exam Vitals signs and nursing note reviewed.  Constitutional:      Appearance: She is well-developed.  HENT:     Head: Normocephalic and atraumatic.  Eyes:     Extraocular Movements: Extraocular movements intact.     Pupils: Pupils are equal, round, and reactive to light.     Comments: Pale conjunctiva  Neck:     Musculoskeletal: Normal range of motion.  Cardiovascular:     Rate and Rhythm: Normal rate and regular rhythm.  Pulmonary:     Effort: No respiratory distress.     Breath sounds: No stridor.  Abdominal:     General: There is no distension.  Musculoskeletal: Normal range of motion.     Right lower leg: No edema.     Left lower leg: No  edema.  Skin:    General: Skin is warm and dry.  Neurological:     General: No focal deficit present.     Mental Status: She is alert.      ED Treatments / Results  Labs (all labs ordered are listed, but only abnormal results are displayed) Labs Reviewed  COMPREHENSIVE METABOLIC PANEL - Abnormal; Notable for the following components:      Result Value   Chloride 117 (*)    CO2 10 (*)    Glucose, Bld 182 (*)    BUN 37 (*)    Creatinine, Ser 1.67 (*)    Albumin 3.1 (*)    GFR  calc non Af Amer 31 (*)    GFR calc Af Amer 36 (*)    Anion gap 17 (*)    All other components within normal limits  CBC WITH DIFFERENTIAL/PLATELET - Abnormal; Notable for the following components:   WBC 14.0 (*)    RBC 1.85 (*)    Hemoglobin 4.9 (*)    HCT 17.7 (*)    MCHC 27.7 (*)    nRBC 0.6 (*)    Neutro Abs 11.4 (*)    Abs Immature Granulocytes 0.09 (*)    All other components within normal limits  PROTIME-INR - Abnormal; Notable for the following components:   Prothrombin Time 21.5 (*)    INR 1.9 (*)    All other components within normal limits  CBC - Abnormal; Notable for the following components:   WBC 18.1 (*)    RBC 3.63 (*)    Hemoglobin 10.4 (*)    HCT 31.3 (*)    nRBC 2.0 (*)    All other components within normal limits  BASIC METABOLIC PANEL - Abnormal; Notable for the following components:   CO2 15 (*)    Glucose, Bld 144 (*)    BUN 39 (*)    Creatinine, Ser 1.75 (*)    Calcium 7.6 (*)    GFR calc non Af Amer 29 (*)    GFR calc Af Amer 34 (*)    Anion gap 18 (*)    All other components within normal limits  LACTIC ACID, PLASMA - Abnormal; Notable for the following components:   Lactic Acid, Venous 10.9 (*)    All other components within normal limits  LACTIC ACID, PLASMA - Abnormal; Notable for the following components:   Lactic Acid, Venous 6.6 (*)    All other components within normal limits  PROTIME-INR - Abnormal; Notable for the following components:   Prothrombin  Time 19.0 (*)    INR 1.6 (*)    All other components within normal limits  PROTIME-INR - Abnormal; Notable for the following components:   Prothrombin Time 17.5 (*)    INR 1.5 (*)    All other components within normal limits  BLOOD GAS, ARTERIAL - Abnormal; Notable for the following components:   pO2, Arterial 196 (*)    Bicarbonate 5.7 (*)    All other components within normal limits  DIC (DISSEMINATED INTRAVASCULAR COAGULATION) PANEL - Abnormal; Notable for the following components:   Prothrombin Time 16.6 (*)    INR 1.4 (*)    D-Dimer, Quant 2.88 (*)    All other components within normal limits  CBC - Abnormal; Notable for the following components:   WBC 15.3 (*)    RBC 3.51 (*)    Hemoglobin 10.0 (*)    HCT 31.6 (*)    nRBC 1.4 (*)    All other components within normal limits  PHOSPHORUS - Abnormal; Notable for the following components:   Phosphorus 6.7 (*)    All other components within normal limits  GLUCOSE, CAPILLARY - Abnormal; Notable for the following components:   Glucose-Capillary 128 (*)    All other components within normal limits  CBC - Abnormal; Notable for the following components:   WBC 17.2 (*)    RBC 3.37 (*)    Hemoglobin 9.5 (*)    HCT 28.5 (*)    nRBC 3.1 (*)    All other components within normal limits  CBC - Abnormal; Notable for the following components:   WBC 13.4 (*)  RBC 3.09 (*)    Hemoglobin 8.5 (*)    HCT 26.1 (*)    nRBC 2.5 (*)    All other components within normal limits  GLUCOSE, CAPILLARY - Abnormal; Notable for the following components:   Glucose-Capillary 175 (*)    All other components within normal limits  GLUCOSE, CAPILLARY - Abnormal; Notable for the following components:   Glucose-Capillary 154 (*)    All other components within normal limits  LACTIC ACID, PLASMA - Abnormal; Notable for the following components:   Lactic Acid, Venous 4.2 (*)    All other components within normal limits  LACTIC ACID, PLASMA - Abnormal;  Notable for the following components:   Lactic Acid, Venous 3.2 (*)    All other components within normal limits  PROTIME-INR - Abnormal; Notable for the following components:   Prothrombin Time 16.4 (*)    INR 1.3 (*)    All other components within normal limits  COMPREHENSIVE METABOLIC PANEL - Abnormal; Notable for the following components:   Potassium 2.8 (*)    Glucose, Bld 119 (*)    BUN 39 (*)    Creatinine, Ser 1.78 (*)    Calcium 7.2 (*)    Total Protein 5.2 (*)    Albumin 2.5 (*)    AST 52 (*)    GFR calc non Af Amer 29 (*)    GFR calc Af Amer 33 (*)    All other components within normal limits  CBC - Abnormal; Notable for the following components:   WBC 13.8 (*)    RBC 2.98 (*)    Hemoglobin 8.3 (*)    HCT 25.7 (*)    nRBC 1.5 (*)    All other components within normal limits  GLUCOSE, CAPILLARY - Abnormal; Notable for the following components:   Glucose-Capillary 115 (*)    All other components within normal limits  HEMOGLOBIN AND HEMATOCRIT, BLOOD - Abnormal; Notable for the following components:   Hemoglobin 8.5 (*)    HCT 26.8 (*)    All other components within normal limits  CBG MONITORING, ED - Abnormal; Notable for the following components:   Glucose-Capillary 166 (*)    All other components within normal limits  POCT I-STAT 7, (LYTES, BLD GAS, ICA,H+H) - Abnormal; Notable for the following components:   pH, Arterial 7.339 (*)    pCO2 arterial 28.9 (*)    pO2, Arterial 388.0 (*)    Bicarbonate 15.7 (*)    TCO2 17 (*)    Acid-base deficit 9.0 (*)    Sodium 146 (*)    Calcium, Ion 1.04 (*)    HCT 30.0 (*)    Hemoglobin 10.2 (*)    All other components within normal limits  POCT I-STAT 7, (LYTES, BLD GAS, ICA,H+H) - Abnormal; Notable for the following components:   pH, Arterial 7.476 (*)    pCO2 arterial 23.1 (*)    pO2, Arterial 151.0 (*)    Bicarbonate 17.0 (*)    TCO2 18 (*)    Acid-base deficit 5.0 (*)    Sodium 146 (*)    Calcium, Ion 1.01 (*)     HCT 30.0 (*)    Hemoglobin 10.2 (*)    All other components within normal limits  SARS CORONAVIRUS 2 (HOSPITAL ORDER, [facility])  MRSA PCR SCREENING  NOVEL CORONAVIRUS, NAA (HOSPITAL ORDER, SEND-OUT TO REF LAB)  MAGNESIUM  MAGNESIUM  PHOSPHORUS  OCCULT BLOOD X 1 CARD TO LAB, STOOL  HEMOGLOBIN  AND HEMATOCRIT, BLOOD  COMPREHENSIVE METABOLIC PANEL  MAGNESIUM  PHOSPHORUS  CBC  HEMOGLOBIN AND HEMATOCRIT, BLOOD  TYPE AND SCREEN  PREPARE RBC (CROSSMATCH)  PREPARE RBC (CROSSMATCH)  PREPARE FRESH FROZEN PLASMA  ABO/RH  PREPARE RBC (CROSSMATCH)  PREPARE RBC (CROSSMATCH)  MASSIVE TRANSFUSION PROTOCOL ORDER (BLOOD BANK NOTIFICATION)  PREPARE FRESH FROZEN PLASMA  PREPARE PLATELET PHERESIS  PREPARE CRYOPRECIPITATE    EKG EKG Interpretation  Date/Time:  Wednesday May 08 2019 17:43:24 EDT Ventricular Rate:  137 PR Interval:    QRS Duration: 100 QT Interval:  283 QTC Calculation: 428 R Axis:   62 Text Interpretation:  Atrial fibrillation Borderline low voltage, extremity leads RSR' in V1 or V2, right VCD or RVH Repol abnrm suggests ischemia, lateral leads likely rate related ischemic changes Confirmed by Merrily Pew [phone]) on 05/08/2019 6:33:38 PM Also confirmed by Merrily Pew [phone]), editor Philomena Doheny [phone])  on 05/09/2019 7:45:07 AM   Radiology Ct Abdomen Pelvis Wo Contrast  Result Date: 05/09/2019 CLINICAL DATA:  GI bleed. EXAM: CT ABDOMEN AND PELVIS WITHOUT CONTRAST TECHNIQUE: Multidetector CT imaging of the abdomen and pelvis was performed following the standard protocol without IV contrast. COMPARISON:  [facility] CT 10/17/2018 FINDINGS: Lower chest: Left pleural effusion is partially included. Bilateral lower lobe atelectasis. Cardiomegaly is partially included. Hepatobiliary: Nodular hepatic contours suggesting cirrhosis. Arms down positioning leads to mild streak artifact limiting assessment. No evidence for focal lesion. Suggestion of  high-density material in the gallbladder, possible sludge. Possible gallbladder wall thickening. Pancreas: Not well-defined with parenchymal atrophy. No ductal dilatation or inflammation. Spleen: Spleen is normal in size, partially obscured by streak artifact from arms down positioning. Adrenals/Urinary Tract: No adrenal nodule. No hydronephrosis. Mild bilateral perinephric edema. Simple cyst in the posterior right kidney on prior exam is not visualized. Small exophytic cyst in the lower right kidney is unchanged. Stomach/Bowel: Enteric tube tip and side-port in the stomach. Stomach is nondistended. No small bowel dilatation or inflammation. No obstruction. Appendix not confidently visualized. Normal terminal ileum. Majority of the colon is decompressed. No obvious colonic wall thickening or inflammatory change. No findings to localize source of GI bleed. Vascular/Lymphatic: Aortic atherosclerosis. No aneurysm. Limited assessment for adenopathy given lack contrast and presence of ascites. Reproductive: Uterine calcifications may be fibroids. No obvious adnexal mass. Other: Moderate volume free fluid in the abdomen and pelvis, improved fluid volume from prior exam. Fluid appears slightly heterogeneous and greater density than seen with simple ascites, Hounsfield units 25-33 no free air. No loculated fluid collection. Musculoskeletal: There are no acute or suspicious osseous abnormalities. IMPRESSION: 1. No findings to localize source of GI bleed. 2. Moderate volume ascites in the abdomen and pelvis, diminished fluid volume from December 2019 CT. Fluid appears slightly heterogeneous and greater density than seen with simple ascites. Findings may reflect complex ascites/peritonitis, however an element of hemorrhage is also considered without identifiable source. 3. Cirrhotic hepatic morphology. Possible sludge in the gallbladder and gallbladder wall thickening. 4. Left pleural effusion is partially included. Bilateral  lower lobe atelectasis. Aortic Atherosclerosis (ICD10-I70.0). These results will be called to the ordering clinician or representative by the Radiologist Assistant, and communication documented in the PACS or zVision Dashboard. Electronically Signed   By: Keith Rake M.D.   On: 05/09/2019 02:48   Vas [place] Upper Extremity Arterial Duplex  Result Date: 05/10/2019 UPPER EXTREMITY DUPLEX STUDY Indications: Pain.  Risk Factors:  Hypertension. Other Factors: persistent A-Fib, History of stroke, carotid artery occlusion,  recent arterial line. earlier IV infiltrate Limitations: Severe edema Comparison Study: No previous exam available for comparison Performing Technologist: Toma Copier RVS  Examination Guidelines: A complete evaluation includes B-mode imaging, spectral Doppler, color Doppler, and power Doppler as needed of all accessible portions of each vessel. Bilateral testing is considered an integral part of a complete examination. Limited examinations for reoccurring indications may be performed as noted.  Right Doppler Findings: +----------+----------+--------+-----------+--------+  Site       PSV (cm/s) Waveform Plaque      Comments  +----------+----------+--------+-----------+--------+  Subclavian 80         biphasic no stenosis           +----------+----------+--------+-----------+--------+  Axillary                                             +----------+----------+--------+-----------+--------+  Brachial                                             +----------+----------+--------+-----------+--------+  Radial                                               +----------+----------+--------+-----------+--------+ Left Doppler Findings: +----------+----------+--------+-----------+--------+  Site       PSV (cm/s) Waveform Plaque      Comments  +----------+----------+--------+-----------+--------+  Subclavian 60         biphasic no stenosis            +----------+----------+--------+-----------+--------+  Axillary   79         biphasic no stenosis           +----------+----------+--------+-----------+--------+  Brachial   66         biphasic no stenosis           +----------+----------+--------+-----------+--------+  Radial     49         biphasic no stenosis           +----------+----------+--------+-----------+--------+  Ulnar      49         biphasic no stenosis           +----------+----------+--------+-----------+--------+ Technically difficult due to extreme swelling. Biphasic Doppler waveforms may be secondary to edema and previous right sided stroke.  Summary:  Right: There is no evidence of a right subclavian obstruction. Left: No obstruction visualized in the left upper extremity. *See table(s) above for measurements and observations.    Preliminary    Vas [place] Upper Extremity Venous Duplex  Result Date: 05/10/2019 UPPER VENOUS STUDY  Indications: Pain, Edema, and Status post IV infiltrate Risk Factors: Persistent A-Fib on Xarelto, HTN,h/o/stroke. Comparison Study: No previous exam available for comparison. Performing Technologist: Toma Copier RVS  Examination Guidelines: A complete evaluation includes B-mode imaging, spectral Doppler, color Doppler, and power Doppler as needed of all accessible portions of each vessel. Bilateral testing is considered an integral part of a complete examination. Limited examinations for reoccurring indications may be performed as noted.  Right Findings: +----------+------------+---------+-----------+----------+-------+  RIGHT      Compressible Phasicity Spontaneous Properties Summary  +----------+------------+---------+-----------+----------+-------+  Subclavian     Full        Yes  Yes                         +----------+------------+---------+-----------+----------+-------+  Left Findings: +----------+------------+---------+-----------+----------+---------------------+  LEFT        Compressible Phasicity Spontaneous Properties        Summary         +----------+------------+---------+-----------+----------+---------------------+  IJV                                                       Unable to visualize                                                                due to IV line                                                                      placement        +----------+------------+---------+-----------+----------+---------------------+  Subclavian     Full        Yes        Yes                                       +----------+------------+---------+-----------+----------+---------------------+  Axillary       Full        Yes        Yes                                       +----------+------------+---------+-----------+----------+---------------------+  Brachial       Full        Yes        Yes                                       +----------+------------+---------+-----------+----------+---------------------+  Radial         Full                                                             +----------+------------+---------+-----------+----------+---------------------+  Ulnar          Full                                                             +----------+------------+---------+-----------+----------+---------------------+  Cephalic       None                                        Non compressible                                                                wrist to mid upper                                                                       arm           +----------+------------+---------+-----------+----------+---------------------+  Basilic                                                   Unable to visualize                                                                well enough to                                                                   evaluate due to                                                                     swelling          +----------+------------+---------+-----------+----------+---------------------+ Thrombus noted in the cepahalic vein wrist to mid upper at whoch point hthe vein becomes thrombus free and compressible. Unable to visualize the basilic due to swelling except for an area of the distal upper arm. The vein was non compressible but was probabblt due to the tighness of the arm.  Summary:  Right: No evidence of thrombosis in the subclavian.  Left: No evidence of deep vein thrombosis in the upper extremity. No evidence of thrombosis in the subclavian. Findings consistent with acute superficial vein thrombosis involving the left cephalic vein. See technician comments listed above.  *See table(s) above for measurements and observations.    Preliminary     Procedures .Critical Care Performed by: Merrily Pew, MD Authorized by: Merrily Pew, MD   Critical care provider  statement:    Critical care time (minutes):  75   Critical care was necessary to treat or prevent imminent or life-threatening deterioration of the following conditions:  Shock and circulatory failure   Critical care was time spent personally by me on the following activities:  Discussions with consultants, evaluation of patient's response to treatment, examination of patient, ordering and performing treatments and interventions, ordering and review of laboratory studies, ordering and review of radiographic studies, pulse oximetry, re-evaluation of patient's condition, obtaining history from patient or surrogate and review of old charts   (including critical care time)  Medications Ordered in ED Medications  0.9 %  sodium chloride infusion (Manually program via Guardrails IV Fluids) ( Intravenous MAR [place] 05/09/19 1257)  0.9 %  sodium chloride infusion (Manually program via Guardrails IV Fluids) ( Intravenous MAR [place] 05/09/19 1257)  acetaminophen (TYLENOL) tablet 650 mg ( Oral MAR [place] 05/09/19 1257)    Or  acetaminophen (TYLENOL)  suppository 650 mg ( Rectal MAR [place] 05/09/19 1257)  ondansetron (ZOFRAN) tablet 4 mg ( Oral MAR [place] 05/09/19 1257)    Or  ondansetron (ZOFRAN) injection 4 mg ( Intravenous MAR [place] 05/09/19 1257)  latanoprost (XALATAN) 0.005 % ophthalmic solution 1 drop (1 drop Both Eyes Given 05/10/19 2137)  brinzolamide (AZOPT) 1 % ophthalmic suspension 1 drop (1 drop Both Eyes Given 05/10/19 2137)  brimonidine (ALPHAGAN) 0.2 % ophthalmic solution 1 drop (1 drop Both Eyes Given 05/10/19 2137)  sodium chloride 0.9 % bolus 1,000 mL ( Intravenous MAR Unhold 05/09/19 1257)  0.9 %  sodium chloride infusion (Manually program via Guardrails IV Fluids) ( Intravenous MAR Unhold 05/09/19 1257)  sodium chloride 0.9 % bolus 1,000 mL ( Intravenous MAR Unhold 05/09/19 1257)  0.9 %  sodium chloride infusion (Manually program via Guardrails IV Fluids) ( Intravenous MAR Unhold 05/09/19 1257)  allopurinol (ZYLOPRIM) tablet 100 mg (100 mg Oral Given 05/10/19 1726)  atorvastatin (LIPITOR) tablet 40 mg (40 mg Oral Given 05/10/19 1726)  lactulose (CHRONULAC) 10 GM/15ML solution 30 g (30 g Oral Given 05/10/19 1002)  0.9 %  sodium chloride infusion ( Intra-arterial MAR Unhold 05/09/19 1257)  diltiazem (CARDIZEM) 1 mg/mL load via infusion 15 mg (15 mg Intravenous Bolus from Bag 05/09/19 0230)    And  diltiazem (CARDIZEM) 100 mg in dextrose 5% 131m (1 mg/mL) infusion (0 mg/hr Intravenous Stopped 05/10/19 1312)  sodium chloride flush (NS) 0.9 % injection 10-40 mL (10 mLs Intracatheter Given 05/10/19 2138)  sodium chloride flush (NS) 0.9 % injection 10-40 mL ( Intracatheter MAR Unhold 05/09/19 1257)  Chlorhexidine Gluconate Cloth 2 % PADS 6 each (6 each Topical Given 05/10/19 1515)  diltiazem (CARDIZEM CD) 24 hr capsule 240 mg (240 mg Oral Given 05/10/19 1002)  cefTRIAXone (ROCEPHIN) 2 g in sodium chloride 0.9 % 100 mL IVPB (2 g Intravenous New Bag/Given 05/10/19 1204)  pantoprazole (PROTONIX) EC tablet 40 mg (0 mg Oral Duplicate [id number]  prothrombin  complex conc human (KCENTRA) IVPB 4,957 Units (0 Units Intravenous Stopped 05/08/19 1948)  0.9 %  sodium chloride infusion (Manually program via Guardrails IV Fluids) ( Intravenous Stopped 05/08/19 2153)  phytonadione (VITAMIN K) 10 mg in dextrose 5 % 50 mL IVPB ( Intravenous Rate/Dose Verify 05/09/19 0102)  fentaNYL (SUBLIMAZE) 100 MCG/2ML injection (50 mcg  Given 05/08/19 2253)  fentaNYL (SUBLIMAZE) injection 100 mcg (50 mcg Intravenous Given 05/08/19 2344)  midazolam (VERSED) injection 2 mg (2 mg Intravenous Given 05/08/19 2237)  sodium bicarbonate injection 100 mEq (100 mEq Intravenous  Given 05/08/19 2345)  propofol (DIPRIVAN) 1000 GQ/916XI infusion (  Duplicate 5/0/38 8828)  sodium bicarbonate 1 mEq/mL injection (  Duplicate 0/0/34 9179)  phenylephrine (NEOSYNEPHRINE) 10-0.9 XT/056PV-% infusion (  Duplicate 07/05/79 1655)  sodium bicarbonate injection 150 mEq (0 mEq Intravenous Duplicate 01/05/47 2707)  sodium bicarbonate 1 mEq/mL injection (  Duplicate 06/05/74 4492)  polyethylene glycol-electrolytes (NuLYTELY/GoLYTELY) solution 4,000 mL (4,000 mLs Oral Given 05/09/19 0318)  sodium bicarbonate injection 150 mEq (150 mEq Intravenous Given 05/08/19 2343)  rocuronium (ZEMURON) injection 90 mg (90 mg Intravenous Given 05/08/19 2344)  etomidate (AMIDATE) injection 20 mg (20 mg Intravenous Given 05/08/19 2344)  magnesium sulfate IVPB 2 g 50 mL (2 g Intravenous New Bag/Given 05/10/19 0719)  potassium chloride 10 mEq in 50 mL *CENTRAL LINE* IVPB (10 mEq Intravenous New Bag/Given 05/10/19 1429)  sodium chloride 0.9 % bolus 1,000 mL (0 mLs Intravenous Stopped 05/10/19 1516)  fentaNYL (SUBLIMAZE) 100 MCG/2ML injection (has no administration in time range)  midazolam PF (VERSED) 5 MG/ML injection (has no administration in time range)  fentaNYL (SUBLIMAZE) 100 MCG/2ML injection (has no administration in time range)  midazolam PF (VERSED) 5 MG/ML injection (has no administration in time range)  EPINEPHrine (ADRENALIN) 1 MG/10ML  injection (has no administration in time range)  spot ink marker (for ENDO) 5 ml 5 ml syringe kit (has no administration in time range)     Initial Impression / Assessment and Plan / ED Course  I have reviewed the triage vital signs and the nursing notes.  Pertinent labs & imaging results that were available during my care of the patient were reviewed by me and considered in my medical decision making (see chart for details).        Patient critically ill secondary to GI bleed.  Patient states she been having dark stools with a maroon component For a few days and started feeling really weak with dyspnea on exertion and fatigue on exertion progressively worse in the brought here for further evaluation.  She stated that she denies any hematemesis but also recently diagnosed with liver disease so is unclear whether she is having upper or lower GI bleed.  She was on Xarelto for atrial fibrillation.  Initially patient has some soft pressures which responded to some fluids.  A unit of emergency release blood was given because she was so tachycardic.  Her hemoglobin came back at 4.9 and more blood was given after that.  Eppie Gibson was also given.  I discussed with gastroenterology who recommended Protonix and octreotide so we knew for sure whether this was upper or lower.  Patient initially responded pretty well but then became hypotensive with blood pressures in 60s over 20s.  Massive transfusion protocol was enacted.  Patient remained with soft pressures and thus was started on Levophed.  Discussed with pharmacy about any other reversal agents.  Also started FFP.  Discussed with GI to update to see if there is any emergent procedure they could do however they stated her blood pressure need to be more stable but they would come and evaluate her.  Discussed with critical care who will admit to the ICU.  Final Clinical Impressions(s) / ED Diagnoses   Final diagnoses:  Symptomatic anemia  Rectal bleeding    [facility])    ED Discharge Orders    None       Ho Parisi, Corene Cornea, MD 05/11/19 0159

## 2019-05-12 ENCOUNTER — Inpatient Hospital Stay (HOSPITAL_COMMUNITY): Payer: Medicare Other

## 2019-05-12 LAB — BPAM RBC
Blood Product Expiration Date: 202008072359
Blood Product Expiration Date: 202008072359
Blood Product Expiration Date: 202008072359
Blood Product Expiration Date: 202008072359
Blood Product Expiration Date: 202008072359
Blood Product Expiration Date: 202008072359
Blood Product Expiration Date: 202008072359
Blood Product Expiration Date: 202008072359
Blood Product Expiration Date: 202008072359
Blood Product Expiration Date: 202008072359
Blood Product Expiration Date: 202008082359
Blood Product Expiration Date: 202008082359
ISSUE DATE / TIME: 202007081834
ISSUE DATE / TIME: 202007082004
ISSUE DATE / TIME: 202007082119
ISSUE DATE / TIME: 202007082132
ISSUE DATE / TIME: 202007090619
ISSUE DATE / TIME: 202007091043
ISSUE DATE / TIME: 202007091549
ISSUE DATE / TIME: 202007091637
ISSUE DATE / TIME: 202007110810
ISSUE DATE / TIME: 202007111243
Unit Type and Rh: 5100
Unit Type and Rh: 5100
Unit Type and Rh: 5100
Unit Type and Rh: 5100
Unit Type and Rh: 5100
Unit Type and Rh: 5100
Unit Type and Rh: 5100
Unit Type and Rh: 5100
Unit Type and Rh: 5100
Unit Type and Rh: 5100
Unit Type and Rh: 5100
Unit Type and Rh: 5100

## 2019-05-12 LAB — TYPE AND SCREEN
ABO/RH(D): O POS
Antibody Screen: NEGATIVE
Unit division: 0
Unit division: 0
Unit division: 0
Unit division: 0
Unit division: 0
Unit division: 0
Unit division: 0
Unit division: 0
Unit division: 0
Unit division: 0
Unit division: 0
Unit division: 0

## 2019-05-12 LAB — COMPREHENSIVE METABOLIC PANEL
ALT: 28 U/L (ref 0–44)
AST: 32 U/L (ref 15–41)
Albumin: 2.7 g/dL — ABNORMAL LOW (ref 3.5–5.0)
Alkaline Phosphatase: 93 U/L (ref 38–126)
Anion gap: 11 (ref 5–15)
BUN: 19 mg/dL (ref 8–23)
CO2: 22 mmol/L (ref 22–32)
Calcium: 8.2 mg/dL — ABNORMAL LOW (ref 8.9–10.3)
Chloride: 104 mmol/L (ref 98–111)
Creatinine, Ser: 1.21 mg/dL — ABNORMAL HIGH (ref 0.44–1.00)
GFR calc Af Amer: 53 mL/min — ABNORMAL LOW (ref >60)
GFR calc non Af Amer: 46 mL/min — ABNORMAL LOW (ref >60)
Glucose, Bld: 134 mg/dL — ABNORMAL HIGH (ref 70–99)
Potassium: 3.7 mmol/L (ref 3.5–5.1)
Sodium: 137 mmol/L (ref 135–145)
Total Bilirubin: 0.9 mg/dL (ref 0.3–1.2)
Total Protein: 5.9 g/dL — ABNORMAL LOW (ref 6.5–8.1)

## 2019-05-12 LAB — CBC
HCT: 27.8 % — ABNORMAL LOW (ref 36.0–46.0)
Hemoglobin: 8.7 g/dL — ABNORMAL LOW (ref 12.0–15.0)
MCH: 27.6 pg (ref 26.0–34.0)
MCHC: 31.3 g/dL (ref 30.0–36.0)
MCV: 88.3 fL (ref 80.0–100.0)
Platelets: 203 10*3/uL (ref 150–400)
RBC: 3.15 MIL/uL — ABNORMAL LOW (ref 3.87–5.11)
RDW: 14.7 % (ref 11.5–15.5)
WBC: 11.2 10*3/uL — ABNORMAL HIGH (ref 4.0–10.5)
nRBC: 1.1 % — ABNORMAL HIGH (ref 0.0–0.2)

## 2019-05-12 LAB — HEMOGLOBIN AND HEMATOCRIT, BLOOD
HCT: 32.3 % — ABNORMAL LOW (ref 36.0–46.0)
Hemoglobin: 10.1 g/dL — ABNORMAL LOW (ref 12.0–15.0)

## 2019-05-12 LAB — PHOSPHORUS: Phosphorus: 2.1 mg/dL — ABNORMAL LOW (ref 2.5–4.6)

## 2019-05-12 LAB — MAGNESIUM: Magnesium: 2.4 mg/dL (ref 1.7–2.4)

## 2019-05-12 MED ORDER — K PHOS MONO-SOD PHOS DI & MONO 155-852-130 MG PO TABS
250.0000 mg | ORAL_TABLET | Freq: Three times a day (TID) | ORAL | Status: AC
  Administered 2019-05-12 – 2019-05-13 (×4): 250 mg via ORAL
  Filled 2019-05-12 (×4): qty 1

## 2019-05-12 MED ORDER — CEFAZOLIN SODIUM-DEXTROSE 2-4 GM/100ML-% IV SOLN
2.0000 g | Freq: Three times a day (TID) | INTRAVENOUS | Status: DC
  Administered 2019-05-12 – 2019-05-14 (×5): 2 g via INTRAVENOUS
  Filled 2019-05-12 (×6): qty 100

## 2019-05-12 MED ORDER — SPIRONOLACTONE 25 MG PO TABS
50.0000 mg | ORAL_TABLET | Freq: Every day | ORAL | Status: DC
  Administered 2019-05-12 – 2019-05-22 (×11): 50 mg via ORAL
  Filled 2019-05-12 (×11): qty 2

## 2019-05-12 NOTE — Plan of Care (Signed)
  Problem: Elimination: Goal: Will not experience complications related to urinary retention Outcome: Completed/Met   Problem: Safety: Goal: Ability to remain free from injury will improve Outcome: Completed/Met

## 2019-05-12 NOTE — Progress Notes (Signed)
PROGRESS NOTE    Erin Holt  JTT:[id number] DOB: [birth year] DOA: 05/08/2019 PCP: Leeroy Cha, MD  Outpatient Specialists:     Brief Narrative:  Patient is a 68 year old female with past medical history significant for Karlene Lineman cirrhosis, hypertension, atrial fibrillation on Xarelto and prior CVA.  Patient presented to the hospital with black stool and dark red blood per rectum for past 5 days or so prior to presentation.  Patient was intubated and admitted to ICU team.  Patient underwent EGD and colonoscopy without any clear source of bleeding.  Patient was transferred to the hospitalist service, will be seen on the floor for the first time today.  05/12/2019:  Patient underwent colonoscopy that was nonrevealing.  Patient underwent EGD that revealed gastric erosions without any evidence of recent bleeding.  Hemoglobin has remained stable.  Patient has been extubated, and transferred to the hospitalist service.  Hemoglobin has remained stable.  Hemoglobin today is 10.1 g/dL.  Left forearm is swollen, firm to touch with some cellulitic changes and blisters.  Further management will depend on hospital course.   Assessment & Plan:   Principal Problem:   Hemorrhagic shock ([place]) Active Problems:   Permanent atrial fibrillation   AKI (acute kidney injury) ([place])   Liver cirrhosis ([place])   GI bleed   Acute blood loss anemia   Respiratory insufficiency   GI bleed: Patient is status post EGD and colonoscopy. Source of bleeding remains unclear. Patient has history of Karlene Lineman cirrhosis, and was on Xarelto for atrial fibrillation. H/H is stable. Continue supportive care for now.  Chronic kidney disease stage III versus acute kidney injury on chronic kidney disease stage III: Creatinine is improving Serum creatinine today is 1.21. Continue to monitor closely.  Permanent atrial fibrillation: Anticoagulation is on hold As per collateral information, GI has advised holding  anticoagulation for 7 days. We will defer management of the anticoagulation to the GI and cardiology team. Heart rate ranges from 85 to 126 bpm. Optimize heart rate control. Patient is on Cardizem 240 Mg p.o. once daily.  Hypertension: Continue to optimize. Patient is also on Aldactone.  Left forearm swelling/cellulitis: CT result is noted Keep left forearm elevated IV Ancef Further management will depend on hospital course.  DVT prophylaxis: SCD Code Status: Full code Family Communication:  Disposition Plan: Home eventually   Consultants:   GI  Patient was initially under pulmonary/ICU team  Procedures:   EGD  Colonoscopy  Antimicrobials:   We will start patient on IV Ancef   Subjective: No new complaints No further bleeding  Objective: Vitals:   05/11/19 1913 05/11/19 2307 05/12/19 0330 05/12/19 1101  BP: (!) 126/98 103/73 112/77 (!) 137/98  Pulse: (!) 115 96 85 (!) 104  Resp: 18 18 18 18   Temp: 98.7 F (37.1 C) 99.3 F (37.4 C) 99.1 F (37.3 C) 100 F (37.8 C)  TempSrc: Oral Oral Oral Oral  SpO2: 100% 100% 100% 100%  Weight:   104.2 kg   Height:        Intake/Output Summary (Last 24 hours) at 05/12/2019 1739 Last data filed at 05/12/2019 1238 Gross per 24 hour  Intake 1097 ml  Output 425 ml  Net 672 ml   Filed Weights   05/11/19 0405 05/11/19 1201 05/12/19 0330  Weight: 103.5 kg 103.5 kg 104.2 kg    Examination:  General exam: Appears calm and comfortable.  Obese. Respiratory system: Clear to auscultation. Respiratory effort normal. Cardiovascular system: S1 & S2  Gastrointestinal system: Abdomen is  morbidly obese, soft and nontender. No organomegaly or masses felt. Normal bowel sounds heard. Central nervous system: Alert and oriented. No focal neurological deficits. Extremities: Left forearm is swollen, firm, with blisters and areas of redness that are warm to touch.  Data Reviewed: I have personally reviewed following labs and  imaging studies  CBC: Recent Labs  Lab 05/08/19 1751  05/09/19 1430 05/09/19 2130 05/10/19 0409  05/11/19 0200 05/11/19 0402 05/11/19 1426 05/12/19 0245 05/12/19 1458  WBC 14.0*   < > 17.2* 13.4* 13.8*  --   --  12.3*  --  11.2*  --   NEUTROABS 11.4*  --   --   --   --   --   --   --   --   --   --   HGB 4.9*   < > 9.5* 8.5* 8.3*   < > 8.4* 8.4* 9.8* 8.7* 10.1*  HCT 17.7*   < > 28.5* 26.1* 25.7*   < > 26.6* 27.0* 31.4* 27.8* 32.3*  MCV 95.7   < > 84.6 84.5 86.2  --   --  90.6  --  88.3  --   PLT 360   < > 280 226 209  --   --  191  --  203  --    < > = values in this interval not displayed.   Basic Metabolic Panel: Recent Labs  Lab 05/08/19 1751  05/09/19 0349 05/09/19 0353 05/10/19 0409 05/11/19 0402 05/12/19 0245  NA 144   < > 144 146* 145 140 137  K 4.5   < > 4.2 3.9 2.8* 3.1* 3.7  CL 117*  --  111  --  106 105 104  CO2 10*  --  15*  --  26 24 22   GLUCOSE 182*  --  144*  --  119* 116* 134*  BUN 37*  --  39*  --  39* 25* 19  CREATININE 1.67*  --  1.75*  --  1.78* 1.25* 1.21*  CALCIUM 8.9  --  7.6*  --  7.2* 7.7* 8.2*  MG  --   --  2.1  --  1.9 2.6* 2.4  PHOS  --   --  6.7*  --  3.6 2.6 2.1*   < > = values in this interval not displayed.   GFR: Estimated Creatinine Clearance: 52.3 mL/min (A) (by C-G formula based on SCr of 1.21 mg/dL (H)). Liver Function Tests: Recent Labs  Lab 05/08/19 1751 05/10/19 0409 05/11/19 0402 05/12/19 0245  AST 29 52* 40 32  ALT 14 25 27 28   ALKPHOS 93 77 77 93  BILITOT 0.6 1.2 0.9 0.9  PROT 6.6 5.2* 5.2* 5.9*  ALBUMIN 3.1* 2.5* 2.4* 2.7*   No results for input(s): LIPASE, AMYLASE in the last 168 hours. No results for input(s): AMMONIA in the last 168 hours. Coagulation Profile: Recent Labs  Lab 05/08/19 1751 05/08/19 2140 05/09/19 0025 05/09/19 0349 05/10/19 0409  INR 1.9* 1.6* 1.4* 1.5* 1.3*   Cardiac Enzymes: No results for input(s): CKTOTAL, CKMB, CKMBINDEX, TROPONINI in the last 168 hours. BNP (last 3 results)  No results for input(s): PROBNP in the last 8760 hours. HbA1C: No results for input(s): HGBA1C in the last 72 hours. CBG: Recent Labs  Lab 05/08/19 1736 05/08/19 2220 05/09/19 0029 05/09/19 0351 05/10/19 0342  GLUCAP 166* 128* 175* 154* 115*   Lipid Profile: No results for input(s): CHOL, HDL, LDLCALC, TRIG, CHOLHDL, LDLDIRECT in the last 72 hours. Thyroid  Function Tests: No results for input(s): TSH, T4TOTAL, FREET4, T3FREE, THYROIDAB in the last 72 hours. Anemia Panel: No results for input(s): VITAMINB12, FOLATE, FERRITIN, TIBC, IRON, RETICCTPCT in the last 72 hours. Urine analysis:    Component Value Date/Time   COLORURINE YELLOW 11/29/2018 2315   APPEARANCEUR CLEAR 11/29/2018 2315   LABSPEC 1.012 11/29/2018 2315   PHURINE 5.0 11/29/2018 2315   GLUCOSEU NEGATIVE 11/29/2018 2315   HGBUR NEGATIVE 11/29/2018 2315   BILIRUBINUR NEGATIVE 11/29/2018 2315   KETONESUR NEGATIVE 11/29/2018 2315   PROTEINUR NEGATIVE 11/29/2018 2315   NITRITE NEGATIVE 11/29/2018 2315   LEUKOCYTESUR NEGATIVE 11/29/2018 2315   Sepsis Labs: @LABRCNTIP (procalcitonin:4,lacticidven:4)  ) Recent Results (from the past 240 hour(s))  SARS Coronavirus 2 (CEPHEID - Performed in [place] hospital lab), Hosp Order     Status: None   Collection Time: 05/08/19  8:25 PM   Specimen: Nasopharyngeal Swab  Result Value Ref Range Status   SARS Coronavirus 2 NEGATIVE NEGATIVE Final    Comment: (NOTE) If result is NEGATIVE SARS-CoV-2 target nucleic acids are NOT DETECTED. The SARS-CoV-2 RNA is generally detectable in upper and lower  respiratory specimens during the acute phase of infection. The lowest  concentration of SARS-CoV-2 viral copies this assay can detect is 250  copies / mL. A negative result does not preclude SARS-CoV-2 infection  and should not be used as the sole basis for treatment or other  patient management decisions.  A negative result may occur with  improper specimen collection /  handling, submission of specimen other  than nasopharyngeal swab, presence of viral mutation(s) within the  areas targeted by this assay, and inadequate number of viral copies  (<250 copies / mL). A negative result must be combined with clinical  observations, patient history, and epidemiological information. If result is POSITIVE SARS-CoV-2 target nucleic acids are DETECTED. The SARS-CoV-2 RNA is generally detectable in upper and lower  respiratory specimens dur ing the acute phase of infection.  Positive  results are indicative of active infection with SARS-CoV-2.  Clinical  correlation with patient history and other diagnostic information is  necessary to determine patient infection status.  Positive results do  not rule out bacterial infection or co-infection with other viruses. If result is PRESUMPTIVE POSTIVE SARS-CoV-2 nucleic acids MAY BE PRESENT.   A presumptive positive result was obtained on the submitted specimen  and confirmed on repeat testing.  While 2019 novel coronavirus  (SARS-CoV-2) nucleic acids may be present in the submitted sample  additional confirmatory testing may be necessary for epidemiological  and / or clinical management purposes  to differentiate between  SARS-CoV-2 and other Sarbecovirus currently known to infect humans.  If clinically indicated additional testing with an alternate test  methodology [phone]) is advised. The SARS-CoV-2 RNA is generally  detectable in upper and lower respiratory sp ecimens during the acute  phase of infection. The expected result is Negative. Fact Sheet for Patients:  StrictlyIdeas.no Fact Sheet for Healthcare Providers: BankingDealers.co.za This test is not yet approved or cleared by the [place] FDA and has been authorized for detection and/or diagnosis of SARS-CoV-2 by FDA under an Emergency Use Authorization (EUA).  This EUA will remain in effect (meaning this  test can be used) for the duration of the COVID-19 declaration under Section 564(b)(1) of the Act, 21 U.S.C. section 360bbb-3(b)(1), unless the authorization is terminated or revoked sooner. Performed at [facility], [place] [address] [place], [place] [address]   MRSA PCR Screening  Status: None   Collection Time: 05/08/19 11:47 PM   Specimen: Nasopharyngeal  Result Value Ref Range Status   MRSA by PCR NEGATIVE NEGATIVE Final    Comment:        The GeneXpert MRSA Assay (FDA approved for NASAL specimens only), is one component of a comprehensive MRSA colonization surveillance program. It is not intended to diagnose MRSA infection nor to guide or monitor treatment for MRSA infections. Performed at [facility], [place] [address], [place],  [address]          Radiology Studies: Ct Forearm Left Wo Contrast  Result Date: 05/12/2019 CLINICAL DATA:  Patient admitted with gastrointestinal bleeding. Left forearm pain. Question compartment syndrome. EXAM: CT OF THE LEFT FOREARM WITHOUT CONTRAST TECHNIQUE: Multidetector CT imaging was performed according to the standard protocol. Multiplanar CT image reconstructions were also generated. COMPARISON:  None. FINDINGS: Bones/Joint/Cartilage No acute or focal bony abnormality is identified. No joint effusion. Ligaments Suboptimally assessed by CT. Muscles and Tendons Appear intact. No mass or intramuscular fluid collection is identified. No gas within muscle or tracking along fascial planes is seen. Soft tissues Scattered subcutaneous edema is present. No focal fluid collection. Multiple skin blisters are identified. The largest is along the distal radius and measures 2 cm AP by 0.9 cm transverse by 2.1 cm long. IMPRESSION: CT scan is unable to diagnose compartment syndrome. If there is concern for compartment syndrome, pressure measurements are recommended. Subcutaneous edema about the forearm could be due to dependent change or  cellulitis. Scattered blisters on skin. No acute bony or joint abnormality. Electronically Signed   By: Inge Rise M.D.   On: 05/12/2019 14:57        Scheduled Meds: . sodium chloride   Intravenous Once  . allopurinol  100 mg Oral QPM  . atorvastatin  40 mg Oral QPM  . brimonidine  1 drop Both Eyes BID  . brinzolamide  1 drop Both Eyes BID  . Chlorhexidine Gluconate Cloth  6 each Topical Daily  . diltiazem  240 mg Oral Daily  . latanoprost  1 drop Both Eyes QHS  . pantoprazole  40 mg Oral Daily  . phosphorus  250 mg Oral TID  . spironolactone  50 mg Oral Daily   Continuous Infusions: .  ceFAZolin (ANCEF) IV       LOS: 4 days    Time spent: 35 minutes.   Dana Allan, MD  [facility] Pager #: [phone] 7PM-7AM contact night coverage as above

## 2019-05-12 NOTE — Evaluation (Signed)
Occupational Therapy Evaluation Patient Details Name: Erin Holt MRN: [id number] DOB: September 04, 1951 Today's Date: 05/12/2019    History of Present Illness 68 y.o. female with medical history significant of NASH cirrhosis just recently diagnosed, hasnt had EGD yet, HTN, A.Fib on Xarelto, prior stroke.Admitted 05/08/2019 with c/o black stool and dark red blood from rectum, recieved 1 unit PRBC in ED. Intubatd 7/9-7/08/2019. EGD showed gastric erosions. No varices. One clip placed. Upper and Lower GI negative for bleeding source.   Clinical Impression   PTA patient independent and working. Admitted for above and limited by problem list below, including L UE edema and pain, decreased activity tolerance and generalized weakness. Patient educated on gentle ROM exercises to L UE, elevation and use of heat compresses for edema reduction (per MD recommendation), initated education on safety and energy conservation.  Completes transfers with supervision, grooming with supervision standing at sink, min assist for UB ADLs and close supervision for LB ADLs.  Increased time and effort for all tasks due to decreased functional use of L UE.  Patient will benefit from continued OT services while admitted in order to maximize independence and safety with ADLs, but anticipate no further needs after dc.     Follow Up Recommendations  No OT follow up;Supervision - Intermittent    Equipment Recommendations  None recommended by OT    Recommendations for Other Services       Precautions / Restrictions Precautions Precaution Comments: keep L UE elevated, use heat compresses  Restrictions Weight Bearing Restrictions: No      Mobility Bed Mobility               General bed mobility comments: OOB upon entry  Transfers Overall transfer level: Needs assistance Equipment used: None Transfers: Sit to/from Stand Sit to Stand: Supervision         General transfer comment: supervision for safety     Balance Overall balance assessment: Needs assistance Sitting-balance support: Feet supported;No upper extremity supported Sitting balance-Leahy Scale: Good     Standing balance support: No upper extremity supported;During functional activity Standing balance-Leahy Scale: Fair                             ADL either performed or assessed with clinical judgement   ADL Overall ADL's : Needs assistance/impaired     Grooming: Wash/dry hands;Supervision/safety;Standing   Upper Body Bathing: Minimal assistance;Sitting   Lower Body Bathing: Sit to/from stand;Supervison/ safety Lower Body Bathing Details (indicate cue type and reason): reviewed safety and recommendations bathing sitting  Upper Body Dressing : Sitting;Minimal assistance   Lower Body Dressing: Supervision/safety;Sit to/from stand Lower Body Dressing Details (indicate cue type and reason): increased time and effort to don socks, supervision sit<>stand Toilet Transfer: Supervision/safety;Ambulation Toilet Transfer Details (indicate cue type and reason): simulated in room         Functional mobility during ADLs: Supervision/safety General ADL Comments: pt limited by edema and pain in L UE, decreased act tolerance     Vision         Perception     Praxis      Pertinent Vitals/Pain Pain Assessment: 0-10 Pain Score: 6  Pain Location: L forearm Pain Descriptors / Indicators: Pressure;Tightness;Discomfort Pain Intervention(s): Limited activity within patient's tolerance;Monitored during session;Repositioned;Heat applied(elevated UE )     Hand Dominance Right   Extremity/Trunk Assessment Upper Extremity Assessment Upper Extremity Assessment: LUE deficits/detail LUE Deficits / Details: pain, edema in arm/hand limiting  ROM and strength  LUE Coordination: decreased fine motor;decreased gross motor   Lower Extremity Assessment Lower Extremity Assessment: Defer to PT evaluation   Cervical / Trunk  Assessment Cervical / Trunk Assessment: Normal   Communication Communication Communication: No difficulties   Cognition Arousal/Alertness: Awake/alert Behavior During Therapy: WFL for tasks assessed/performed Overall Cognitive Status: Within Functional Limits for tasks assessed                                     General Comments  L arm edema and clear pustules, encouraged elevation and warm compresses, as well as gentle ROM to hand - repositioned for comfort     Exercises Exercises: Other exercises Other Exercises Other Exercises: gentle ROM includuing fist pumps, wrist f/e   Shoulder Instructions      Home Living Family/patient expects to be discharged to:: Private residence Living Arrangements: Alone Available Help at Discharge: Friend(s);Available PRN/intermittently Type of Home: House Home Access: Stairs to enter [employer] of Steps: 1   Home Layout: One level     Bathroom Shower/Tub: Teacher, early years/pre: Handicapped height Bathroom Accessibility: Yes   Home Equipment: Shower seat          Prior Functioning/Environment Level of Independence: Independent        Comments: working        OT Problem List: Decreased strength;Decreased range of motion;Decreased activity tolerance;Impaired balance (sitting and/or standing);Decreased coordination;Pain;Increased edema;Impaired UE functional use;Decreased knowledge of use of DME or AE      OT Treatment/Interventions: Self-care/ADL training;Therapeutic exercise;Balance training;Patient/family education;Therapeutic activities;Energy conservation;DME and/or AE instruction    OT Goals(Current goals can be found in the care plan section) Acute Rehab OT Goals Patient Stated Goal: to have less pain in my left arm OT Goal Formulation: With patient Time For Goal Achievement: 05/26/19 Potential to Achieve Goals: Good  OT Frequency: Min 2X/week   Barriers to D/C:             Co-evaluation              AM-PAC OT "6 Clicks" Daily Activity     Outcome Measure Help from another person eating meals?: A Little Help from another person taking care of personal grooming?: A Little Help from another person toileting, which includes using toliet, bedpan, or urinal?: A Little Help from another person bathing (including washing, rinsing, drying)?: A Little Help from another person to put on and taking off regular upper body clothing?: A Little Help from another person to put on and taking off regular lower body clothing?: A Little 6 Click Score: 18   End of Session Nurse Communication: Mobility status  Activity Tolerance: Patient tolerated treatment well Patient left: in chair;with call bell/phone within reach;with nursing/sitter in room  OT Visit Diagnosis: Pain;Muscle weakness (generalized) (M62.81) Pain - Right/Left: Left Pain - part of body: Arm;Hand                Time: 4656-8127 OT Time Calculation (min): 13 min Charges:  OT General Charges $OT Visit: 1 Visit OT Evaluation $OT Eval Moderate Complexity: 1 Mod  Delight Stare, OT Acute Rehabilitation Services Pager [phone] Office [phone]   Delight Stare 05/12/2019, 8:16 AM

## 2019-05-12 NOTE — Progress Notes (Signed)
Patient has a history of A-fib- HR got up into the 150s.  Patient was asymptomatic.   She had gotten up to go to bathroom.   Pt had BM on chair and on floor.   Gown changed, sox changed, cleaned floor.

## 2019-05-12 NOTE — Progress Notes (Signed)
Pt asking about when she can eat regular food, paged MD to advise

## 2019-05-12 NOTE — Progress Notes (Signed)
[facility] Progress Note  Subjective: No signs of further gastrointestinal bleeding.  Patient is hungry.  States she has gained weight since being here in the hospital.  Gives a history of having had ascites and paracentesis in the past.  Review of her medicines show she is not on any diuretics at this time.  Objective: Vital signs in last 24 hours: Temp:  [98.7 F (37.1 C)-100 F (37.8 C)] 100 F (37.8 C) (07/12 1101) Pulse Rate:  [85-115] 104 (07/12 1101) Resp:  [18] 18 (07/12 1101) BP: (103-145)/(73-111) 137/98 (07/12 1101) SpO2:  [100 %] 100 % (07/12 1101) Weight:  [104.2 kg] 104.2 kg (07/12 0330) Weight change: 0 kg   PE:  No distress Abdomen soft   Lab Results: Results for orders placed or performed during the hospital encounter of 05/08/19 (from the past 24 hour(s))  Hemoglobin and hematocrit, blood     Status: Abnormal   Collection Time: 05/11/19  2:26 PM  Result Value Ref Range   Hemoglobin 9.8 (L) 12.0 - 15.0 g/dL   HCT 31.4 (L) 36.0 - 46.0 %  Comprehensive metabolic panel     Status: Abnormal   Collection Time: 05/12/19  2:45 AM  Result Value Ref Range   Sodium 137 135 - 145 mmol/L   Potassium 3.7 3.5 - 5.1 mmol/L   Chloride 104 98 - 111 mmol/L   CO2 22 22 - 32 mmol/L   Glucose, Bld 134 (H) 70 - 99 mg/dL   BUN 19 8 - 23 mg/dL   Creatinine, Ser 1.21 (H) 0.44 - 1.00 mg/dL   Calcium 8.2 (L) 8.9 - 10.3 mg/dL   Total Protein 5.9 (L) 6.5 - 8.1 g/dL   Albumin 2.7 (L) 3.5 - 5.0 g/dL   AST 32 15 - 41 U/L   ALT 28 0 - 44 U/L   Alkaline Phosphatase 93 38 - 126 U/L   Total Bilirubin 0.9 0.3 - 1.2 mg/dL   GFR calc non Af Amer 46 (L) >60 mL/min   GFR calc Af Amer 53 (L) >60 mL/min   Anion gap 11 5 - 15  Magnesium     Status: None   Collection Time: 05/12/19  2:45 AM  Result Value Ref Range   Magnesium 2.4 1.7 - 2.4 mg/dL  Phosphorus     Status: Abnormal   Collection Time: 05/12/19  2:45 AM  Result Value Ref Range   Phosphorus 2.1 (L) 2.5 - 4.6 mg/dL   CBC     Status: Abnormal   Collection Time: 05/12/19  2:45 AM  Result Value Ref Range   WBC 11.2 (H) 4.0 - 10.5 K/uL   RBC 3.15 (L) 3.87 - 5.11 MIL/uL   Hemoglobin 8.7 (L) 12.0 - 15.0 g/dL   HCT 27.8 (L) 36.0 - 46.0 %   MCV 88.3 80.0 - 100.0 fL   MCH 27.6 26.0 - 34.0 pg   MCHC 31.3 30.0 - 36.0 g/dL   RDW 14.7 11.5 - 15.5 %   Platelets 203 150 - 400 K/uL   nRBC 1.1 (H) 0.0 - 0.2 %    Studies/Results: No results found.    Assessment: Karlene Lineman cirrhosis GI bleed clinically seems to have resolved.  Weight gain most likely secondary to fluid retention   Plan:   Advance diet  Begin Aldactone 50 mg p.o. daily    SAM F Jeanie Mccard 05/12/2019, 12:01 PM  Pager: [phone] If no answer or after 5 PM call [phone]

## 2019-05-13 ENCOUNTER — Encounter (HOSPITAL_COMMUNITY): Payer: Self-pay | Admitting: Gastroenterology

## 2019-05-13 LAB — CBC
HCT: 26.4 % — ABNORMAL LOW (ref 36.0–46.0)
Hemoglobin: 8.2 g/dL — ABNORMAL LOW (ref 12.0–15.0)
MCH: 27.2 pg (ref 26.0–34.0)
MCHC: 31.1 g/dL (ref 30.0–36.0)
MCV: 87.4 fL (ref 80.0–100.0)
Platelets: 180 10*3/uL (ref 150–400)
RBC: 3.02 MIL/uL — ABNORMAL LOW (ref 3.87–5.11)
RDW: 14.7 % (ref 11.5–15.5)
WBC: 13.5 10*3/uL — ABNORMAL HIGH (ref 4.0–10.5)
nRBC: 0.5 % — ABNORMAL HIGH (ref 0.0–0.2)

## 2019-05-13 MED ORDER — SODIUM CHLORIDE 0.9 % IV SOLN
INTRAVENOUS | Status: DC | PRN
  Administered 2019-05-13 – 2019-05-20 (×3): 1000 mL via INTRAVENOUS

## 2019-05-13 MED ORDER — FUROSEMIDE 20 MG PO TABS
20.0000 mg | ORAL_TABLET | Freq: Every day | ORAL | Status: DC
  Administered 2019-05-13 – 2019-05-22 (×10): 20 mg via ORAL
  Filled 2019-05-13 (×10): qty 1

## 2019-05-13 MED ORDER — PRO-STAT SUGAR FREE PO LIQD
30.0000 mL | Freq: Two times a day (BID) | ORAL | Status: DC
  Administered 2019-05-13 – 2019-05-22 (×19): 30 mL via ORAL
  Filled 2019-05-13 (×19): qty 30

## 2019-05-13 NOTE — Progress Notes (Signed)
Nutrition Follow-up  DOCUMENTATION CODES:   Obesity unspecified  INTERVENTION:  Provide 30 ml Prostat po BID, each supplement provides 100 kcal and 15 grams of protein.   Encourage adequate PO intake.  NUTRITION DIAGNOSIS:   Inadequate oral intake related to acute illness as evidenced by NPO status; diet advanced; improving  GOAL:   Patient will meet greater than or equal to 90% of their needs; met  MONITOR:   PO intake, Supplement acceptance, Skin, Weight trends, Labs, I & O's  REASON FOR ASSESSMENT:   Ventilator    ASSESSMENT:   68 yo female with recent dx of NASH cirrhosis admitted with admitted with massive GI bleed and hemorrhagic shock, severe metabolic acidosis, AKI. PMH includes HTN, stroke, A.fib on Xarelto Extubated 7/9.   GI bleeding has resolved. EGD revealed gastric erosions without evidence of recent bleeding. Colonoscopy nonrevealing. Diet advanced to a heart healthy diet yesterday. Meal completion 100%. Intake good. RD to order nutritional supplements to aid in increased caloric and protein needs. Labs and medications reviewed.   Diet Order:   Diet Order            Diet Heart Room service appropriate? Yes; Fluid consistency: Thin  Diet effective now              EDUCATION NEEDS:   Not appropriate for education at this time  Skin:  Skin Assessment: Skin Integrity Issues: Skin Integrity Issues:: Other (Comment) Other: non pressure wound L arm  Last BM:  7/12  Height:   Ht Readings from Last 1 Encounters:  05/11/19 _0  (1.626 m)    Weight:   Wt Readings from Last 1 Encounters:  05/13/19 106.2 kg  05/08/19 93.9 kg Net I/O: +8 L since admission  Ideal Body Weight:  54.5 kg  BMI:  Body mass index is 40.2 kg/m.  Estimated Nutritional Needs:   Kcal:  1850-2050  Protein:  110-125 grams  Fluid:  Per MD    Corrin Parker, MS, RD, LDN Pager # [phone] After hours/ weekend pager # [phone]

## 2019-05-13 NOTE — Care Management Important Message (Signed)
Important Message  Patient Details  Name: Erin Holt MRN: [id number] Date of Birth: 1951/04/05   Medicare Important Message Given:  Yes     [name] 05/13/2019, 2:33 PM

## 2019-05-13 NOTE — Progress Notes (Signed)
No further signs of bleeding. She was on Lasix as outpatient for her ascites. Will start Lasix. She can follow up with Dr Therisa Doyne as outpatient. Call us if needed.

## 2019-05-13 NOTE — Progress Notes (Signed)
Occupational Therapy Treatment Patient Details Name: Erin Holt MRN: [id number] DOB: Sep 11, 1951 Today's Date: 05/13/2019    History of present illness 68 y.o. female with medical history significant of NASH cirrhosis, HTN, A.Fib on Xarelto, prior stroke. Admitted 05/08/2019 with c/o black stool and dark red blood from rectum with hemorrhagic shock, Intubatd 7/9-7/08/2019. EGD showed gastric erosions.   OT comments  Patient seated in recliner and agreeable to OT.  Engaged in energy conservation education and discussed application to her daily routines, provided handout.  Engaged in gentle ROM to L UE, and encouraged pt to complete ROM throughout day due to endorsing increased "stiffness" in her joints.  Continued education on positioning, noted when distracted pt tends to place UE in dependent position with hand/wrist down.  Pt declined further mobility at this time. Will follow and plan to complete ADL with energy conservation techniques next session.    Follow Up Recommendations  No OT follow up;Supervision - Intermittent    Equipment Recommendations  None recommended by OT    Recommendations for Other Services      Precautions / Restrictions Precautions Precaution Comments: keep L UE elevated, use heat compresses  Restrictions Weight Bearing Restrictions: No       Mobility Bed Mobility               General bed mobility comments: EOB in chair   Transfers Overall transfer level: Needs assistance     Sit to Stand: Supervision         General transfer comment: pt declines mobility at this time     Balance Overall balance assessment: Mild deficits observed, not formally tested                                         ADL either performed or assessed with clinical judgement   ADL Overall ADL's : Needs assistance/impaired                                       General ADL Comments: session focused on energy conservation  education and L UE ROM/elevation      Vision       Perception     Praxis      Cognition Arousal/Alertness: Awake/alert Behavior During Therapy: WFL for tasks assessed/performed Overall Cognitive Status: Within Functional Limits for tasks assessed                                          Exercises Exercises: Other exercises Other Exercises Other Exercises: gentle ROM x 10 reps to L UE: overhead press, elbow flex/extension, wrist flexion/extension, gross hand flexion/extension--limited by edema  Other Exercises: continued education on elevation of UE, discussed position options (with fingers in air or elevated on pillows)- poor carryover as fingers down when not focused on UE position   Shoulder Instructions       General Comments initated and reviewed education on energy conservation techniques and how to apply to her daily routines; pt verbalizes understanding with education     Pertinent Vitals/ Pain       Pain Assessment: No/denies pain  Home Living  Prior Functioning/Environment              Frequency  Min 2X/week        Progress Toward Goals  OT Goals(current goals can now be found in the care plan section)  Progress towards OT goals: Progressing toward goals  Acute Rehab OT Goals Patient Stated Goal: for my left arm to get better OT Goal Formulation: With patient  Plan Discharge plan remains appropriate;Frequency remains appropriate    Co-evaluation                 AM-PAC OT "6 Clicks" Daily Activity     Outcome Measure   Help from another person eating meals?: A Little Help from another person taking care of personal grooming?: A Little Help from another person toileting, which includes using toliet, bedpan, or urinal?: A Little Help from another person bathing (including washing, rinsing, drying)?: A Little Help from another person to put on and taking off  regular upper body clothing?: A Little Help from another person to put on and taking off regular lower body clothing?: A Little 6 Click Score: 18    End of Session    OT Visit Diagnosis: Pain;Muscle weakness (generalized) (M62.81)   Activity Tolerance Patient tolerated treatment well   Patient Left in chair;with call bell/phone within reach   Nurse Communication Mobility status        Time: 7654-6503 OT Time Calculation (min): 14 min  Charges: OT General Charges $OT Visit: 1 Visit OT Treatments $Therapeutic Exercise: 8-22 mins  Delight Stare, OT Acute Rehabilitation Services Pager [phone] Office [phone]    Delight Stare 05/13/2019, 10:05 AM

## 2019-05-13 NOTE — Progress Notes (Signed)
Physical Therapy Treatment Patient Details Name: Erin Holt MRN: [id number] DOB: 1950/11/21 Today's Date: 05/13/2019    History of Present Illness 68 y.o. female with medical history significant of NASH cirrhosis, HTN, A.Fib on Xarelto, prior stroke. Admitted 05/08/2019 with c/o black stool and dark red blood from rectum with hemorrhagic shock, Intubatd 7/9-7/08/2019. EGD showed gastric erosions.    PT Comments    PT educated for energy conservation, LUE elevation and hand exercises as well as continued ambulation in room. PT with improved activity tolerance but remains limited by fatigue and educated for progressive walking program as well as use of cane at home due to current reliance on Iv pole.  HR 122 with gait   Follow Up Recommendations  No PT follow up;Supervision - Intermittent     Equipment Recommendations  None recommended by PT    Recommendations for Other Services       Precautions / Restrictions Precautions Precaution Comments: keep L UE elevated, use heat compresses     Mobility  Bed Mobility               General bed mobility comments: EOB on arrival  Transfers Overall transfer level: Needs assistance     Sit to Stand: Supervision         General transfer comment: supervision for lines pt able to stand from bed and toilet without assist  Ambulation/Gait Ambulation/Gait assistance: Supervision Gait Distance (Feet): 200 Feet Assistive device: IV Pole Gait Pattern/deviations: Step-through pattern;Decreased stride length   Gait velocity interpretation: >2.62 ft/sec, indicative of community ambulatory General Gait Details: pt with decreased arm swing due to painful LUE with cautious slow gait with wide BOS, walked in room without support but requested IV pole for stability for walking in hall   Stairs             Wheelchair Mobility    Modified Rankin (Stroke Patients Only)       Balance Overall balance assessment: Mild  deficits observed, not formally tested                                          Cognition   Behavior During Therapy: [facility] for tasks assessed/performed Overall Cognitive Status: Within Functional Limits for tasks assessed                                        Exercises      General Comments        Pertinent Vitals/Pain Pain Assessment: No/denies pain    Home Living                      Prior Function            PT Goals (current goals can now be found in the care plan section) Progress towards PT goals: Progressing toward goals    Frequency    Min 3X/week      PT Plan Current plan remains appropriate    Co-evaluation              AM-PAC PT "6 Clicks" Mobility   Outcome Measure  Help needed turning from your back to your side while in a flat bed without using bedrails?: None Help needed moving from lying on your back to sitting on  the side of a flat bed without using bedrails?: None Help needed moving to and from a bed to a chair (including a wheelchair)?: None Help needed standing up from a chair using your arms (e.g., wheelchair or bedside chair)?: A Little Help needed to walk in hospital room?: A Little Help needed climbing 3-5 steps with a railing? : A Little 6 Click Score: 21    End of Session   Activity Tolerance: Patient tolerated treatment well Patient left: in chair;with call bell/phone within reach Nurse Communication: Mobility status PT Visit Diagnosis: Other abnormalities of gait and mobility (R26.89);Muscle weakness (generalized) (M62.81);Difficulty in walking, not elsewhere classified (R26.2)     Time: 4847-2072 PT Time Calculation (min) (ACUTE ONLY): 20 min  Charges:  $Gait Training: 8-22 mins                     Platter, PT Acute Rehabilitation Services Pager: [phone] Office: [phone]    Sandy Salaam Charl Wellen 05/13/2019, 8:49 AM

## 2019-05-13 NOTE — Plan of Care (Signed)
Pt has been tolerating diet, hemoglobin stable, left arm swelling from infiltration slowly improving

## 2019-05-13 NOTE — Progress Notes (Signed)
PROGRESS NOTE  Erin Holt TKP:[id number] DOB: Dec 31, 1950 DOA: 05/08/2019 PCP: Leeroy Cha, MD   LOS: 5 days   Brief narrative: Patient is a 68 year old female with past medical history significant for [place] cirrhosis, hypertension, atrial fibrillation on Xarelto and prior CVA presented to the hospital with black stool and dark red blood per rectum for 5 days or so prior to presentation.  Patient was intubated and admitted to ICU team.  Patient underwent EGD and colonoscopy without any clear source of bleeding on 05/12/19. EGD  revealed gastric erosions without any evidence of recent bleeding.  Hemoglobin has remained stable.  Patient has been extubated, and transferred to the hospitalist service.   Left forearm is swollen, firm to touch with some cellulitic changes and blisters.  CTof the left upper extremity did not show any evidence of localized abscess.   ultrasound of the left upper extremity did not show any evidence of DVT.  Subjective: Patient denies any nausea, vomiting abdominal pain or further GI bleed.  She however complains of pain over the left upper extremity with proximal swelling and some blisters.  Assessment/Plan:  Principal Problem:   Hemorrhagic shock (HCC) Active Problems:   Permanent atrial fibrillation   AKI (acute kidney injury) ([place])   Liver cirrhosis (HCC)   GI bleed   Acute blood loss anemia   Respiratory insufficiency  GI bleed: status post EGD and colonoscopy.Source of bleeding remains unclear. Patient has history of Karlene Lineman cirrhosis, and was on Xarelto for atrial fibrillation. Patient is currently stable.  We will continue to hold Xarelto for week.  Globin of 8.2 from 10.1 yesterday prior to that it was 8.7.  Will closely need to monitor.  Denies any further bleeding.  Chronic kidney disease stage III versus acute kidney injury on chronic kidney disease stage III: Creatinine levels improved check BMP in a.m.  Permanent atrial fibrillation:  Continue Cardizem.  Xarelto on hold as per GI for at least 7 days.   Hypertension: Blood pressure is stable at this time.  Left forearm swelling/cellulitis: CT scan of the forearm yesterday showed subcutaneous edema.  Ultrasound of the left upper extremity did not show any evidence of DVT.  Scattered blisters.  No obvious drainable fluid reported on the CT scan but without contrast..  Arm elevation, on IV Ancef.  Trending up leukocytosis.  T-max of 100 Fahrenheit.  Will consult surgery/orthopedics if swelling does not improve with persistent fever or leukocytosis.   VTE Prophylaxis: SCD  Code Status: Full code  Family Communication: none  Disposition Plan: Home likely in 2 to 3 days.  We will continue to monitor left arm swelling.   Consultants:  GI, critical care  Procedures:  Intubation, mechanical ventilation, extubation.  EGD colonoscopy  Antibiotics: Anti-infectives (From admission, onward)   Start     Dose/Rate Route Frequency Ordered Stop   05/12/19 2200  ceFAZolin (ANCEF) IVPB 2g/100 mL premix     2 g 200 mL/hr over 30 Minutes Intravenous Every 8 hours 05/12/19 1738     05/10/19 1200  cefTRIAXone (ROCEPHIN) 2 g in sodium chloride 0.9 % 100 mL IVPB  Status:  Discontinued     2 g 200 mL/hr over 30 Minutes Intravenous Every 24 hours 05/10/19 1002 05/12/19 1738   05/09/19 1600  ciprofloxacin (CIPRO) IVPB 400 mg  Status:  Discontinued     400 mg 200 mL/hr over 60 Minutes Intravenous Every 12 hours 05/09/19 1502 05/10/19 1002      Objective: Vitals:   05/12/19  1941 05/13/19 0538  BP: 114/76 110/73  Pulse: (!) 115 91  Resp: 18 19  Temp: 100 F (37.8 C) 99.7 F (37.6 C)  SpO2: 100% 98%    Intake/Output Summary (Last 24 hours) at 05/13/2019 1143 Last data filed at 05/13/2019 1000 Gross per 24 hour  Intake 1460 ml  Output 300 ml  Net 1160 ml   Filed Weights   05/11/19 1201 05/12/19 0330 05/13/19 0538  Weight: 103.5 kg 104.2 kg 106.2 kg   Body mass  index is 40.2 kg/m.   Physical Exam: GENERAL: Patient is alert awake and oriented. Not in obvious distress.  Morbidly obese HENT: No scleral pallor or icterus. Pupils equally reactive to light. Oral mucosa is moist NECK: is supple, no palpable thyroid enlargement. CHEST: Clear to auscultation. No crackles or wheezes. Non tender on palpation. Diminished breath sounds bilaterally. CVS: S1 and S2 heard, no murmur. Regular rate and rhythm. No pericardial rub. ABDOMEN: Soft, non-tender, bowel sounds are present. No palpable hepato-splenomegaly. EXTREMITIES: Left forearm is swollen forearm with area of redness and tenderness with blisters. CNS: Cranial nerves are intact. No focal motor or sensory deficits. SKIN: warm and dry, left upper extremity with cellulitis blisters.  Data Review: I have personally reviewed the following laboratory data and studies,  CBC: Recent Labs  Lab 05/08/19 1751  05/09/19 2130 05/10/19 0409  05/11/19 0402 05/11/19 1426 05/12/19 0245 05/12/19 1458 05/13/19 0228  WBC 14.0*   < > 13.4* 13.8*  --  12.3*  --  11.2*  --  13.5*  NEUTROABS 11.4*  --   --   --   --   --   --   --   --   --   HGB 4.9*   < > 8.5* 8.3*   < > 8.4* 9.8* 8.7* 10.1* 8.2*  HCT 17.7*   < > 26.1* 25.7*   < > 27.0* 31.4* 27.8* 32.3* 26.4*  MCV 95.7   < > 84.5 86.2  --  90.6  --  88.3  --  87.4  PLT 360   < > 226 209  --  191  --  203  --  180   < > = values in this interval not displayed.   Basic Metabolic Panel: Recent Labs  Lab 05/08/19 1751  05/09/19 0349 05/09/19 0353 05/10/19 0409 05/11/19 0402 05/12/19 0245  NA 144   < > 144 146* 145 140 137  K 4.5   < > 4.2 3.9 2.8* 3.1* 3.7  CL 117*  --  111  --  106 105 104  CO2 10*  --  15*  --  26 24 22   GLUCOSE 182*  --  144*  --  119* 116* 134*  BUN 37*  --  39*  --  39* 25* 19  CREATININE 1.67*  --  1.75*  --  1.78* 1.25* 1.21*  CALCIUM 8.9  --  7.6*  --  7.2* 7.7* 8.2*  MG  --   --  2.1  --  1.9 2.6* 2.4  PHOS  --   --  6.7*  --   3.6 2.6 2.1*   < > = values in this interval not displayed.   Liver Function Tests: Recent Labs  Lab 05/08/19 1751 05/10/19 0409 05/11/19 0402 05/12/19 0245  AST 29 52* 40 32  ALT 14 25 27 28   ALKPHOS 93 77 77 93  BILITOT 0.6 1.2 0.9 0.9  PROT 6.6 5.2* 5.2* 5.9*  ALBUMIN 3.1*  2.5* 2.4* 2.7*   No results for input(s): LIPASE, AMYLASE in the last 168 hours. No results for input(s): AMMONIA in the last 168 hours. Cardiac Enzymes: No results for input(s): CKTOTAL, CKMB, CKMBINDEX, TROPONINI in the last 168 hours. BNP (last 3 results) No results for input(s): BNP in the last 8760 hours.  ProBNP (last 3 results) No results for input(s): PROBNP in the last 8760 hours.  CBG: Recent Labs  Lab 05/08/19 1736 05/08/19 2220 05/09/19 0029 05/09/19 0351 05/10/19 0342  GLUCAP 166* 128* 175* 154* 115*   Recent Results (from the past 240 hour(s))  SARS Coronavirus 2 (CEPHEID - Performed in [place] hospital lab), Hosp Order     Status: None   Collection Time: 05/08/19  8:25 PM   Specimen: Nasopharyngeal Swab  Result Value Ref Range Status   SARS Coronavirus 2 NEGATIVE NEGATIVE Final    Comment: (NOTE) If result is NEGATIVE SARS-CoV-2 target nucleic acids are NOT DETECTED. The SARS-CoV-2 RNA is generally detectable in upper and lower  respiratory specimens during the acute phase of infection. The lowest  concentration of SARS-CoV-2 viral copies this assay can detect is 250  copies / mL. A negative result does not preclude SARS-CoV-2 infection  and should not be used as the sole basis for treatment or other  patient management decisions.  A negative result may occur with  improper specimen collection / handling, submission of specimen other  than nasopharyngeal swab, presence of viral mutation(s) within the  areas targeted by this assay, and inadequate number of viral copies  (<250 copies / mL). A negative result must be combined with clinical  observations, patient history,  and epidemiological information. If result is POSITIVE SARS-CoV-2 target nucleic acids are DETECTED. The SARS-CoV-2 RNA is generally detectable in upper and lower  respiratory specimens dur ing the acute phase of infection.  Positive  results are indicative of active infection with SARS-CoV-2.  Clinical  correlation with patient history and other diagnostic information is  necessary to determine patient infection status.  Positive results do  not rule out bacterial infection or co-infection with other viruses. If result is PRESUMPTIVE POSTIVE SARS-CoV-2 nucleic acids MAY BE PRESENT.   A presumptive positive result was obtained on the submitted specimen  and confirmed on repeat testing.  While 2019 novel coronavirus  (SARS-CoV-2) nucleic acids may be present in the submitted sample  additional confirmatory testing may be necessary for epidemiological  and / or clinical management purposes  to differentiate between  SARS-CoV-2 and other Sarbecovirus currently known to infect humans.  If clinically indicated additional testing with an alternate test  methodology [phone]) is advised. The SARS-CoV-2 RNA is generally  detectable in upper and lower respiratory sp ecimens during the acute  phase of infection. The expected result is Negative. Fact Sheet for Patients:  StrictlyIdeas.no Fact Sheet for Healthcare Providers: BankingDealers.co.za This test is not yet approved or cleared by the [place] FDA and has been authorized for detection and/or diagnosis of SARS-CoV-2 by FDA under an Emergency Use Authorization (EUA).  This EUA will remain in effect (meaning this test can be used) for the duration of the COVID-19 declaration under Section 564(b)(1) of the Act, 21 U.S.C. section 360bbb-3(b)(1), unless the authorization is terminated or revoked sooner. Performed at [facility], [place] [address] [place], [place] [address]   MRSA PCR  Screening     Status: None   Collection Time: 05/08/19 11:47 PM   Specimen: Nasopharyngeal  Result Value Ref  Range Status   MRSA by PCR NEGATIVE NEGATIVE Final    Comment:        The GeneXpert MRSA Assay (FDA approved for NASAL specimens only), is one component of a comprehensive MRSA colonization surveillance program. It is not intended to diagnose MRSA infection nor to guide or monitor treatment for MRSA infections. Performed at [facility], [place] [address], [place], [place] [address]      Studies: Ct Forearm Left Wo Contrast  Result Date: 05/12/2019 CLINICAL DATA:  Patient admitted with gastrointestinal bleeding. Left forearm pain. Question compartment syndrome. EXAM: CT OF THE LEFT FOREARM WITHOUT CONTRAST TECHNIQUE: Multidetector CT imaging was performed according to the standard protocol. Multiplanar CT image reconstructions were also generated. COMPARISON:  None. FINDINGS: Bones/Joint/Cartilage No acute or focal bony abnormality is identified. No joint effusion. Ligaments Suboptimally assessed by CT. Muscles and Tendons Appear intact. No mass or intramuscular fluid collection is identified. No gas within muscle or tracking along fascial planes is seen. Soft tissues Scattered subcutaneous edema is present. No focal fluid collection. Multiple skin blisters are identified. The largest is along the distal radius and measures 2 cm AP by 0.9 cm transverse by 2.1 cm long. IMPRESSION: CT scan is unable to diagnose compartment syndrome. If there is concern for compartment syndrome, pressure measurements are recommended. Subcutaneous edema about the forearm could be due to dependent change or cellulitis. Scattered blisters on skin. No acute bony or joint abnormality. Electronically Signed   By: Inge Rise M.D.   On: 05/12/2019 14:57    Scheduled Meds: . sodium chloride   Intravenous Once  . allopurinol  100 mg Oral QPM  . atorvastatin  40 mg Oral QPM  . brimonidine  1 drop Both  Eyes BID  . brinzolamide  1 drop Both Eyes BID  . diltiazem  240 mg Oral Daily  . feeding supplement (PRO-STAT SUGAR FREE 64)  30 mL Oral BID  . latanoprost  1 drop Both Eyes QHS  . pantoprazole  40 mg Oral Daily  . spironolactone  50 mg Oral Daily    Continuous Infusions: .  ceFAZolin (ANCEF) IV Stopped (05/13/19 0830)     Flora Lipps, MD  [facility] 05/13/2019

## 2019-05-13 NOTE — Consult Note (Signed)
   [facility] CM Inpatient Consult   05/13/2019  Erin Holt 1951-05-22 [id number]   Chart reviewed: Medicare NGACO  Patient reviewed for high risk score [22%] for unplanned readmission risk. MD notes reveals as follows: Erin Holt is a 68 y.o. female with medical history significant of NASH cirrhosis just recently diagnosed, hasnt had EGD yet, HTN, A.Fib on Xarelto, prior stroke. Patient presents to the ED with c/o black stool and dark red blood per rectum for past 5 days or so.  Symptoms constant, nothing makes better or worse.  No abd pain, no vomiting.  Spoke with patient via hospital room telephone.  HIPAA verified.  Explained [facility] services.  Patient states she would like post hospital calls from a nurse for education and support. Primary Care Provider: Leeroy Cha, MD of [facility] and the provider provides the transition of care follow up.  Plan:  Follow patient with [place] Management Coordinator.  [place] [facility] does not interfere with or replace any services arranged by inpatient [facility] team.  For questions, please contact:  Natividad Brood, RN BSN [facility] Liaison  [phone] business mobile phone Toll free office [phone]  Fax number: [phone] [email or web address]

## 2019-05-14 DIAGNOSIS — L03114 Cellulitis of left upper limb: Secondary | ICD-10-CM | POA: Diagnosis not present

## 2019-05-14 LAB — CBC
HCT: 27.8 % — ABNORMAL LOW (ref 36.0–46.0)
Hemoglobin: 8.6 g/dL — ABNORMAL LOW (ref 12.0–15.0)
MCH: 27 pg (ref 26.0–34.0)
MCHC: 30.9 g/dL (ref 30.0–36.0)
MCV: 87.4 fL (ref 80.0–100.0)
Platelets: 175 10*3/uL (ref 150–400)
RBC: 3.18 MIL/uL — ABNORMAL LOW (ref 3.87–5.11)
RDW: 14.9 % (ref 11.5–15.5)
WBC: 14.7 10*3/uL — ABNORMAL HIGH (ref 4.0–10.5)
nRBC: 0.1 % (ref 0.0–0.2)

## 2019-05-14 LAB — COMPREHENSIVE METABOLIC PANEL
ALT: 17 U/L (ref 0–44)
AST: 20 U/L (ref 15–41)
Albumin: 2.5 g/dL — ABNORMAL LOW (ref 3.5–5.0)
Alkaline Phosphatase: 103 U/L (ref 38–126)
Anion gap: 6 (ref 5–15)
BUN: 20 mg/dL (ref 8–23)
CO2: 23 mmol/L (ref 22–32)
Calcium: 8.1 mg/dL — ABNORMAL LOW (ref 8.9–10.3)
Chloride: 107 mmol/L (ref 98–111)
Creatinine, Ser: 1.11 mg/dL — ABNORMAL HIGH (ref 0.44–1.00)
GFR calc Af Amer: 59 mL/min — ABNORMAL LOW (ref >60)
GFR calc non Af Amer: 51 mL/min — ABNORMAL LOW (ref >60)
Glucose, Bld: 113 mg/dL — ABNORMAL HIGH (ref 70–99)
Potassium: 3.5 mmol/L (ref 3.5–5.1)
Sodium: 136 mmol/L (ref 135–145)
Total Bilirubin: 0.7 mg/dL (ref 0.3–1.2)
Total Protein: 5.8 g/dL — ABNORMAL LOW (ref 6.5–8.1)

## 2019-05-14 LAB — MAGNESIUM: Magnesium: 2 mg/dL (ref 1.7–2.4)

## 2019-05-14 MED ORDER — VANCOMYCIN HCL IN DEXTROSE 1-5 GM/200ML-% IV SOLN
1000.0000 mg | INTRAVENOUS | Status: DC
  Administered 2019-05-15 – 2019-05-18 (×4): 1000 mg via INTRAVENOUS
  Filled 2019-05-14 (×4): qty 200

## 2019-05-14 MED ORDER — PIPERACILLIN-TAZOBACTAM 3.375 G IVPB 30 MIN
3.3750 g | Freq: Once | INTRAVENOUS | Status: AC
  Administered 2019-05-14: 3.375 g via INTRAVENOUS
  Filled 2019-05-14: qty 50

## 2019-05-14 MED ORDER — SODIUM CHLORIDE 0.9% FLUSH: 10.0000 mL | INTRAVENOUS | Status: DC | PRN

## 2019-05-14 MED ORDER — VANCOMYCIN HCL 10 G IV SOLR
2000.0000 mg | Freq: Once | INTRAVENOUS | Status: AC
  Administered 2019-05-14: 13:00:00 2000 mg via INTRAVENOUS
  Filled 2019-05-14: qty 2000

## 2019-05-14 MED ORDER — VANCOMYCIN HCL IN DEXTROSE 1-5 GM/200ML-% IV SOLN: 1000.0000 mg | Freq: Once | INTRAVENOUS | Status: DC

## 2019-05-14 MED ORDER — SODIUM CHLORIDE 0.9% FLUSH
10.0000 mL | Freq: Two times a day (BID) | INTRAVENOUS | Status: DC
  Administered 2019-05-14 – 2019-05-22 (×6): 10 mL

## 2019-05-14 MED ORDER — PIPERACILLIN-TAZOBACTAM 3.375 G IVPB
3.3750 g | Freq: Three times a day (TID) | INTRAVENOUS | Status: DC
  Administered 2019-05-14 – 2019-05-21 (×20): 3.375 g via INTRAVENOUS
  Filled 2019-05-14 (×21): qty 50

## 2019-05-14 MED ORDER — BRIMONIDINE TARTRATE 0.2 % OP SOLN
1.0000 [drp] | Freq: Two times a day (BID) | OPHTHALMIC | Status: DC
  Administered 2019-05-14 – 2019-05-21 (×8): 1 [drp] via OPHTHALMIC
  Filled 2019-05-14: qty 5

## 2019-05-14 NOTE — Consult Note (Signed)
Reason for Consult:Left arm swelling Referring Physician: L Pokhrel  Erin Holt is an 68 y.o. female.  HPI: Erin Holt was admitted 7/8 for UGI bleed. On Saturday or thereabout she began to have left FA pain and swelling. She went on to develop bullae and redness. She has had some fevers but no other constitutional symptoms of disseminated infection. She did have an IV in that arm. She denies any prior hx/o similar. She denies any sort of trauma to the arm. She is RHD.   Past Medical History:  Diagnosis Date  . Arthritis    "left knee" (10/09/2012)  . Atrial fibrillation ([place])   . Carotid artery occlusion    a. Carotid US (12/10/13):  R 60-79%; L 1-39% - f/u 6 mos  . Glaucoma (increased eye pressure)    "both eyes" (10/09/2012)  . Gout   . Hx of echocardiogram    a. Echo (09/2012):  Mild LVH, EF 55-65%, Gr 1 DD, MAC, mild MR, mild to mod LAE, mild RVE, PASP 44 mmHg  . Hypertension   . Stroke [facility]) 10/09/2012    Past Surgical History:  Procedure Laterality Date  . BREAST BIOPSY  1980's   "left; it wasn't anything" (10/09/2012)  . COLONOSCOPY WITH PROPOFOL N/A 12/12/2016   Procedure: COLONOSCOPY WITH PROPOFOL;  Surgeon: Garlan Fair, MD;  Location: [facility];  Service: Endoscopy;  Laterality: N/A;  . COLONOSCOPY WITH PROPOFOL N/A 05/09/2019   Procedure: COLONOSCOPY WITH PROPOFOL;  Surgeon: Laurence Spates, MD;  Location: [place];  Service: Endoscopy;  Laterality: N/A;  . ESOPHAGOGASTRODUODENOSCOPY (EGD) WITH PROPOFOL N/A 05/08/2019   Procedure: ESOPHAGOGASTRODUODENOSCOPY (EGD) WITH PROPOFOL;  Surgeon: Ronnette Juniper, MD;  Location: [place];  Service: Gastroenterology;  Laterality: N/A;  . HEMOSTASIS CLIP PLACEMENT  05/08/2019   Procedure: HEMOSTASIS CLIP PLACEMENT;  Surgeon: Ronnette Juniper, MD;  Location: [place];  Service: Gastroenterology;;  . IR PARACENTESIS  09/25/2018  . IR PARACENTESIS  10/16/2018  . KNEE ARTHROSCOPY W/ DEBRIDEMENT  2011   "left; for arthritis"  (10/09/2012)    Family History  Problem Relation Age of Onset  . Heart failure Mother 9       Died  . Diabetes Mother   . Diabetes Father   . CAD Father 74    Social History:  reports that she has never smoked. She has never used smokeless tobacco. She reports that she does not drink alcohol or use drugs.  Allergies: No Known Allergies  Medications: I have reviewed the patient's current medications.  Results for orders placed or performed during the hospital encounter of 05/08/19 (from the past 48 hour(s))  Hemoglobin and hematocrit, blood     Status: Abnormal   Collection Time: 05/12/19  2:58 PM  Result Value Ref Range   Hemoglobin 10.1 (L) 12.0 - 15.0 g/dL   HCT 32.3 (L) 36.0 - 46.0 %    Comment: Performed at [facility], [address], [place], [place] [address]  CBC     Status: Abnormal   Collection Time: 05/13/19  2:28 AM  Result Value Ref Range   WBC 13.5 (H) 4.0 - 10.5 K/uL   RBC 3.02 (L) 3.87 - 5.11 MIL/uL   Hemoglobin 8.2 (L) 12.0 - 15.0 g/dL   HCT 26.4 (L) 36.0 - 46.0 %   MCV 87.4 80.0 - 100.0 fL   MCH 27.2 26.0 - 34.0 pg   MCHC 31.1 30.0 - 36.0 g/dL   RDW 14.7 11.5 - 15.5 %   Platelets  180 150 - 400 K/uL   nRBC 0.5 (H) 0.0 - 0.2 %    Comment: Performed at [facility], [place] [address] [place], [place] [address]  CBC     Status: Abnormal   Collection Time: 05/14/19  7:18 AM  Result Value Ref Range   WBC 14.7 (H) 4.0 - 10.5 K/uL   RBC 3.18 (L) 3.87 - 5.11 MIL/uL   Hemoglobin 8.6 (L) 12.0 - 15.0 g/dL   HCT 27.8 (L) 36.0 - 46.0 %   MCV 87.4 80.0 - 100.0 fL   MCH 27.0 26.0 - 34.0 pg   MCHC 30.9 30.0 - 36.0 g/dL   RDW 14.9 11.5 - 15.5 %   Platelets 175 150 - 400 K/uL   nRBC 0.1 0.0 - 0.2 %    Comment: Performed at [facility], [place] [address] [place], [place] [address]  Comprehensive metabolic panel     Status: Abnormal   Collection Time: 05/14/19  7:18 AM  Result Value Ref Range   Sodium 136 135 - 145 mmol/L   Potassium 3.5 3.5 -  5.1 mmol/L   Chloride 107 98 - 111 mmol/L   CO2 23 22 - 32 mmol/L   Glucose, Bld 113 (H) 70 - 99 mg/dL   BUN 20 8 - 23 mg/dL   Creatinine, Ser 1.11 (H) 0.44 - 1.00 mg/dL   Calcium 8.1 (L) 8.9 - 10.3 mg/dL   Total Protein 5.8 (L) 6.5 - 8.1 g/dL   Albumin 2.5 (L) 3.5 - 5.0 g/dL   AST 20 15 - 41 U/L   ALT 17 0 - 44 U/L   Alkaline Phosphatase 103 38 - 126 U/L   Total Bilirubin 0.7 0.3 - 1.2 mg/dL   GFR calc non Af Amer 51 (L) >60 mL/min   GFR calc Af Amer 59 (L) >60 mL/min   Anion gap 6 5 - 15    Comment: Performed at [place] [address] [place], [place] [address]  Magnesium     Status: None   Collection Time: 05/14/19  7:18 AM  Result Value Ref Range   Magnesium 2.0 1.7 - 2.4 mg/dL    Comment: Performed at [facility] [address], [place], [place] [address]    Ct Forearm Left Wo Contrast  Result Date: 05/12/2019 CLINICAL DATA:  Patient admitted with gastrointestinal bleeding. Left forearm pain. Question compartment syndrome. EXAM: CT OF THE LEFT FOREARM WITHOUT CONTRAST TECHNIQUE: Multidetector CT imaging was performed according to the standard protocol. Multiplanar CT image reconstructions were also generated. COMPARISON:  None. FINDINGS: Bones/Joint/Cartilage No acute or focal bony abnormality is identified. No joint effusion. Ligaments Suboptimally assessed by CT. Muscles and Tendons Appear intact. No mass or intramuscular fluid collection is identified. No gas within muscle or tracking along fascial planes is seen. Soft tissues Scattered subcutaneous edema is present. No focal fluid collection. Multiple skin blisters are identified. The largest is along the distal radius and measures 2 cm AP by 0.9 cm transverse by 2.1 cm long. IMPRESSION: CT scan is unable to diagnose compartment syndrome. If there is concern for compartment syndrome, pressure measurements are recommended. Subcutaneous edema about the forearm could be due to dependent change or cellulitis.  Scattered blisters on skin. No acute bony or joint abnormality. Electronically Signed   By: Inge Rise M.D.   On: 05/12/2019 14:57    Review of Systems  Constitutional: Positive for fever. Negative for chills and weight loss.  HENT: Negative for ear discharge, ear pain, hearing loss and tinnitus.   Eyes: Negative for blurred vision, double vision, photophobia and pain.  Respiratory: Negative for cough, sputum production and shortness of breath.   Cardiovascular: Negative for chest pain.  Gastrointestinal: Negative for abdominal pain, nausea and vomiting.  Genitourinary: Negative for dysuria, flank pain, frequency and urgency.  Musculoskeletal: Positive for myalgias (Left forearm). Negative for back pain, falls, joint pain and neck pain.  Neurological: Negative for dizziness, tingling, sensory change, focal weakness, loss of consciousness and headaches.  Endo/Heme/Allergies: Does not bruise/bleed easily.  Psychiatric/Behavioral: Negative for depression, memory loss and substance abuse. The patient is not nervous/anxious.    Blood pressure (!) 146/86, pulse 97, temperature 99.9 F (37.7 C), temperature source Oral, resp. rate 19, height _0  (1.626 m), weight 105.6 kg, SpO2 100 %. Physical Exam  Constitutional: She appears well-developed and well-nourished. No distress.  HENT:  Head: Normocephalic and atraumatic.  Eyes: Conjunctivae are normal. Right eye exhibits no discharge. Left eye exhibits no discharge. No scleral icterus.  Neck: Normal range of motion.  Cardiovascular: Normal rate and regular rhythm.  Respiratory: Effort normal. No respiratory distress.  Musculoskeletal:     Comments: Left shoulder, elbow, wrist, digits- Multiple bullae FA, erythematous along dorsum, edematous, firm, mod TTP proximal dorsal FA, no pain with AROM elbow or wrist, no instability, no blocks to motion  Sens  Ax/R/M/U paresthetic but stable (s/p CVA)  Mot   Ax/ R/ PIN/ M/ AIN/ U intact  Rad 0  (likely 2/2 edema) but cap refill <2s  Neurological: She is alert.  Skin: Skin is warm and dry. She is not diaphoretic.  Psychiatric: She has a normal mood and affect. Her behavior is normal.    Assessment/Plan: Left forearm cellulitis -- Given poor/no response to Ancef will broaden coverage to vanc and Zosyn. Stressed to pt and RN importance of arm elevation. I do not think she has compartment syndrome nor do I see any indication for surgical involvement as yet. Will reevaluate this afternoon.    Lisette Abu, PA-C Orthopedic Surgery [phone] 05/14/2019, 9:17 AM

## 2019-05-14 NOTE — Progress Notes (Signed)
Pharmacy Antibiotic Note  Erin Holt is a 68 y.o. female admitted on 05/08/2019 with upper GI bleed and on 7/11 developed left forearm cellulitis.  Pharmacy has been consulted for vancomycin and Zosyn dosing.  Vancomycin 1000 mg IV Q 24hrs. Goal AUC 400-550. Expected AUC: 483.6 SCr used: 1.11  Plan: Zosyn 3.375g IV q8h (4 hour infusion).  Vancomycin loading dose 2000 mg IV x1 on 7/14 Vancomycin 1000 mg IV q24h starting on 7/15 Will follow renal function, cultures, and clinical progress and assess vancomycin level if needed   Height: 5\' 4"  (162.6 cm) Weight: 232 lb 11.2 oz (105.6 kg)(scale b) IBW/kg (Calculated) : 54.7  Temp (24hrs), Avg:99.7 F (37.6 C), Min:99 F (37.2 C), Max:100.3 F (37.9 C)  Recent Labs  Lab 05/08/19 2010 05/09/19 0025 05/09/19 0349 05/09/19 0752 05/09/19 1052  05/10/19 0409 05/11/19 0402 05/12/19 0245 05/13/19 0228 05/14/19 0718  WBC  --  15.3* 18.1*  --   --    < > 13.8* 12.3* 11.2* 13.5* 14.7*  CREATININE  --   --  1.75*  --   --   --  1.78* 1.25* 1.21*  --  1.11*  LATICACIDVEN 10.9* 6.6*  --  4.2* 3.2*  --   --   --   --   --   --    < > = values in this interval not displayed.    Estimated Creatinine Clearance: 57.5 mL/min (A) (by C-G formula based on SCr of 1.11 mg/dL (H)).    No Known Allergies  Antimicrobials this admission: Vanc 7/14 >> Zosyn 7/14 >> Cefazolin 7/12 >> 7/14 CTX 7/10 >> 712 Cipro 7/9 >> 7/10  Microbiology results: 7/8 MRSA PCR: negative  7/8 covid: negative   Thank you for allowing pharmacy to be a part of this patient's care.  Cristela Felt, PharmD PGY1 Pharmacy Resident [employer]: [phone]  05/14/2019 10:11 AM

## 2019-05-14 NOTE — Progress Notes (Signed)
PROGRESS NOTE  NATALE BARBA SNK:[id number] DOB: 1951/01/18 DOA: 05/08/2019 PCP: Leeroy Cha, MD   LOS: 6 days   Brief narrative: Patient is a 68 year old female with past medical history significant for Karlene Lineman cirrhosis, hypertension, atrial fibrillation on Xarelto and prior CVA presented to the hospital with black stool and dark red blood per rectum for 5 days or so prior to presentation.  Patient was intubated and admitted to ICU team.  Patient underwent EGD and colonoscopy without any clear source of bleeding on 05/12/19. EGD  revealed gastric erosions without any evidence of recent bleeding.  Hemoglobin has remained stable.  Patient has been extubated, and transferred to the hospitalist service.   Left forearm is swollen, firm to touch with some cellulitic changes and blisters.  CTof the left upper extremity did not show any evidence of localized abscess.   Ultrasound of the left upper extremity did not show any evidence of DVT.  Patient persisted to have tense swelling of the forearm with mild leukocytosis and low-grade fever so orthopedics was consulted on 05/14/2019  Subjective: Patient feels overall better except for the left forearm swelling and pain.  She states that she ambulated some.  No trouble breathing, coughing, nausea or vomiting.  Assessment/Plan:  Principal Problem:   Left arm cellulitis Active Problems:   Permanent atrial fibrillation   AKI (acute kidney injury) ([place])   Liver cirrhosis (HCC)   GI bleed   Acute blood loss anemia   Hemorrhagic shock (HCC)   Respiratory insufficiency  Left forearm swelling/cellulitis: CT scan of the forearm  showed subcutaneous edema.  Ultrasound of the left upper extremity did not show any evidence of DVT.  There is gross edema with scattered blisters.  Orthopedics was consulted and recommended changing antibiotic to vancomycin and Zosyn today.  Patient was on Ancef until yesterday.  Temp Max of 99.9 F.  Leukocytosis trending  up from 13.5-14.7 today.  GI bleed: status post EGD and colonoscopy.Source of bleeding remains unclear. Patient has history of Karlene Lineman cirrhosis, and was on Xarelto for atrial fibrillation. Patient is currently stable.  We will continue to hold Xarelto for a week as per GI recommendation.  Restart date 05/16/2019. hemoglobin of 8.6 today and has remained stable.  She has been followed by GI has been started on Lasix and spironolactone.  Patient will have to follow-up with Dr. Therisa Doyne, GI as outpatient after discharge.  Continue oral Protonix.  Chronic kidney disease stage III versus acute kidney injury on chronic kidney disease stage III: Creatinine levels have improved.  Creatinine of 1.1 today.  Permanent atrial fibrillation: Continue Cardizem.  Xarelto on hold as per GI for at least 7 days after the procedure, procedure date 05/09/2019.Marland Kitchen   Hypertension: Blood pressure is stable at this time.  On Lasix and spironolactone.  Will closely monitor.  VTE Prophylaxis: SCD  Code Status: Full code  Family Communication: none  Disposition Plan: Home likely in 2 to 3 days, pending clinical improvement with left cellulitis.  Consultants:  GI, critical care  Orthopedics consulted 05/14/2019  Procedures:  Intubation, mechanical ventilation, extubation.  EGD, colonoscopy on 05/09/2019.  Antibiotics:  Vancomycin and zosyn > 05/14/19  Anti-infectives (From admission, onward)   Start     Dose/Rate Route Frequency Ordered Stop   05/15/19 1100  vancomycin (VANCOCIN) IVPB 1000 mg/200 mL premix     1,000 mg 200 mL/hr over 60 Minutes Intravenous Every 24 hours 05/14/19 1009     05/14/19 1900  piperacillin-tazobactam (ZOSYN) IVPB 3.375 g  3.375 g 12.5 mL/hr over 240 Minutes Intravenous Every 8 hours 05/14/19 1009     05/14/19 1015  vancomycin (VANCOCIN) 2,000 mg in sodium chloride 0.9 % 500 mL IVPB     2,000 mg 250 mL/hr over 120 Minutes Intravenous  Once 05/14/19 0944     05/14/19 1000   piperacillin-tazobactam (ZOSYN) IVPB 3.375 g     3.375 g 100 mL/hr over 30 Minutes Intravenous  Once 05/14/19 0928 05/14/19 1115   05/14/19 0930  vancomycin (VANCOCIN) IVPB 1000 mg/200 mL premix  Status:  Discontinued     1,000 mg 200 mL/hr over 60 Minutes Intravenous  Once 05/14/19 0928 05/14/19 0941   05/12/19 2200  ceFAZolin (ANCEF) IVPB 2g/100 mL premix  Status:  Discontinued     2 g 200 mL/hr over 30 Minutes Intravenous Every 8 hours 05/12/19 1738 05/14/19 0928   05/10/19 1200  cefTRIAXone (ROCEPHIN) 2 g in sodium chloride 0.9 % 100 mL IVPB  Status:  Discontinued     2 g 200 mL/hr over 30 Minutes Intravenous Every 24 hours 05/10/19 1002 05/12/19 1738   05/09/19 1600  ciprofloxacin (CIPRO) IVPB 400 mg  Status:  Discontinued     400 mg 200 mL/hr over 60 Minutes Intravenous Every 12 hours 05/09/19 1502 05/10/19 1002      Objective: Vitals:   05/14/19 0531 05/14/19 1001  BP:  107/75  Pulse: 97 (!) 101  Resp:    Temp:  99 F (37.2 C)  SpO2:  100%    Intake/Output Summary (Last 24 hours) at 05/14/2019 1232 Last data filed at 05/14/2019 1000 Gross per 24 hour  Intake 1020.68 ml  Output 1700 ml  Net -679.32 ml   Filed Weights   05/12/19 0330 05/13/19 0538 05/14/19 0421  Weight: 104.2 kg 106.2 kg 105.6 kg   Body mass index is 39.94 kg/m.   Physical Exam: General:  Average built, not in obvious distress.  Morbidly obese HENT: Normocephalic, pupils equally reacting to light and accommodation.  No scleral pallor or icterus noted. Oral mucosa is moist.  Chest:  Clear breath sounds.  Diminished breath sounds bilaterally. No crackles or wheezes.  CVS: S1 &S2 heard. No murmur.  Regular rate and rhythm. Abdomen: Soft, nontender, nondistended.  Bowel sounds are heard.  Liver is not palpable, no abdominal mass palpated Extremities: Left forearm edematous erythematous with tense swelling and tenderness and scattered blisters. Psych: Alert, awake and oriented, normal mood CNS:  No  cranial nerve deficits.  Power equal in all extremities.  No sensory deficits noted.  No cerebellar signs.   Skin: Warm, blisters left upper extremity with cellulitis  Data Review: I have personally reviewed the following laboratory data and studies,  CBC: Recent Labs  Lab 05/08/19 1751  05/10/19 0409  05/11/19 0402 05/11/19 1426 05/12/19 0245 05/12/19 1458 05/13/19 0228 05/14/19 0718  WBC 14.0*   < > 13.8*  --  12.3*  --  11.2*  --  13.5* 14.7*  NEUTROABS 11.4*  --   --   --   --   --   --   --   --   --   HGB 4.9*   < > 8.3*   < > 8.4* 9.8* 8.7* 10.1* 8.2* 8.6*  HCT 17.7*   < > 25.7*   < > 27.0* 31.4* 27.8* 32.3* 26.4* 27.8*  MCV 95.7   < > 86.2  --  90.6  --  88.3  --  87.4 87.4  PLT 360   < >  209  --  191  --  203  --  180 175   < > = values in this interval not displayed.   Basic Metabolic Panel: Recent Labs  Lab 05/09/19 0349 05/09/19 0353 05/10/19 0409 05/11/19 0402 05/12/19 0245 05/14/19 0718  NA 144 146* 145 140 137 136  K 4.2 3.9 2.8* 3.1* 3.7 3.5  CL 111  --  106 105 104 107  CO2 15*  --  26 24 22 23   GLUCOSE 144*  --  119* 116* 134* 113*  BUN 39*  --  39* 25* 19 20  CREATININE 1.75*  --  1.78* 1.25* 1.21* 1.11*  CALCIUM 7.6*  --  7.2* 7.7* 8.2* 8.1*  MG 2.1  --  1.9 2.6* 2.4 2.0  PHOS 6.7*  --  3.6 2.6 2.1*  --    Liver Function Tests: Recent Labs  Lab 05/08/19 1751 05/10/19 0409 05/11/19 0402 05/12/19 0245 05/14/19 0718  AST 29 52* 40 32 20  ALT 14 25 27 28 17   ALKPHOS 93 77 77 93 103  BILITOT 0.6 1.2 0.9 0.9 0.7  PROT 6.6 5.2* 5.2* 5.9* 5.8*  ALBUMIN 3.1* 2.5* 2.4* 2.7* 2.5*   No results for input(s): LIPASE, AMYLASE in the last 168 hours. No results for input(s): AMMONIA in the last 168 hours. Cardiac Enzymes: No results for input(s): CKTOTAL, CKMB, CKMBINDEX, TROPONINI in the last 168 hours. BNP (last 3 results) No results for input(s): BNP in the last 8760 hours.  ProBNP (last 3 results) No results for input(s): PROBNP in the last  8760 hours.  CBG: Recent Labs  Lab 05/08/19 1736 05/08/19 2220 05/09/19 0029 05/09/19 0351 05/10/19 0342  GLUCAP 166* 128* 175* 154* 115*   Recent Results (from the past 240 hour(s))  SARS Coronavirus 2 (CEPHEID - Performed in [place] hospital lab), Hosp Order     Status: None   Collection Time: 05/08/19  8:25 PM   Specimen: Nasopharyngeal Swab  Result Value Ref Range Status   SARS Coronavirus 2 NEGATIVE NEGATIVE Final    Comment: (NOTE) If result is NEGATIVE SARS-CoV-2 target nucleic acids are NOT DETECTED. The SARS-CoV-2 RNA is generally detectable in upper and lower  respiratory specimens during the acute phase of infection. The lowest  concentration of SARS-CoV-2 viral copies this assay can detect is 250  copies / mL. A negative result does not preclude SARS-CoV-2 infection  and should not be used as the sole basis for treatment or other  patient management decisions.  A negative result may occur with  improper specimen collection / handling, submission of specimen other  than nasopharyngeal swab, presence of viral mutation(s) within the  areas targeted by this assay, and inadequate number of viral copies  (<250 copies / mL). A negative result must be combined with clinical  observations, patient history, and epidemiological information. If result is POSITIVE SARS-CoV-2 target nucleic acids are DETECTED. The SARS-CoV-2 RNA is generally detectable in upper and lower  respiratory specimens dur ing the acute phase of infection.  Positive  results are indicative of active infection with SARS-CoV-2.  Clinical  correlation with patient history and other diagnostic information is  necessary to determine patient infection status.  Positive results do  not rule out bacterial infection or co-infection with other viruses. If result is PRESUMPTIVE POSTIVE SARS-CoV-2 nucleic acids MAY BE PRESENT.   A presumptive positive result was obtained on the submitted specimen  and  confirmed on repeat testing.  While 2019 novel coronavirus  (  SARS-CoV-2) nucleic acids may be present in the submitted sample  additional confirmatory testing may be necessary for epidemiological  and / or clinical management purposes  to differentiate between  SARS-CoV-2 and other Sarbecovirus currently known to infect humans.  If clinically indicated additional testing with an alternate test  methodology [phone]) is advised. The SARS-CoV-2 RNA is generally  detectable in upper and lower respiratory sp ecimens during the acute  phase of infection. The expected result is Negative. Fact Sheet for Patients:  StrictlyIdeas.no Fact Sheet for Healthcare Providers: BankingDealers.co.za This test is not yet approved or cleared by the [place] FDA and has been authorized for detection and/or diagnosis of SARS-CoV-2 by FDA under an Emergency Use Authorization (EUA).  This EUA will remain in effect (meaning this test can be used) for the duration of the COVID-19 declaration under Section 564(b)(1) of the Act, 21 U.S.C. section 360bbb-3(b)(1), unless the authorization is terminated or revoked sooner. Performed at [facility], [place] [address], [place], [place] [address]   MRSA PCR Screening     Status: None   Collection Time: 05/08/19 11:47 PM   Specimen: Nasopharyngeal  Result Value Ref Range Status   MRSA by PCR NEGATIVE NEGATIVE Final    Comment:        The GeneXpert MRSA Assay (FDA approved for NASAL specimens only), is one component of a comprehensive MRSA colonization surveillance program. It is not intended to diagnose MRSA infection nor to guide or monitor treatment for MRSA infections. Performed at [facility], [place] [address], [place], [place] [address]      Studies: Ct Forearm Left Wo Contrast  Result Date: 05/12/2019 CLINICAL DATA:  Patient admitted with gastrointestinal bleeding. Left forearm pain. Question  compartment syndrome. EXAM: CT OF THE LEFT FOREARM WITHOUT CONTRAST TECHNIQUE: Multidetector CT imaging was performed according to the standard protocol. Multiplanar CT image reconstructions were also generated. COMPARISON:  None. FINDINGS: Bones/Joint/Cartilage No acute or focal bony abnormality is identified. No joint effusion. Ligaments Suboptimally assessed by CT. Muscles and Tendons Appear intact. No mass or intramuscular fluid collection is identified. No gas within muscle or tracking along fascial planes is seen. Soft tissues Scattered subcutaneous edema is present. No focal fluid collection. Multiple skin blisters are identified. The largest is along the distal radius and measures 2 cm AP by 0.9 cm transverse by 2.1 cm long. IMPRESSION: CT scan is unable to diagnose compartment syndrome. If there is concern for compartment syndrome, pressure measurements are recommended. Subcutaneous edema about the forearm could be due to dependent change or cellulitis. Scattered blisters on skin. No acute bony or joint abnormality. Electronically Signed   By: Inge Rise M.D.   On: 05/12/2019 14:57    Scheduled Meds:  sodium chloride   Intravenous Once   allopurinol  100 mg Oral QPM   atorvastatin  40 mg Oral QPM   brimonidine  1 drop Both Eyes BID   brinzolamide  1 drop Both Eyes BID   diltiazem  240 mg Oral Daily   feeding supplement (PRO-STAT SUGAR FREE 64)  30 mL Oral BID   furosemide  20 mg Oral Daily   latanoprost  1 drop Both Eyes QHS   pantoprazole  40 mg Oral Daily   spironolactone  50 mg Oral Daily    Continuous Infusions:  sodium chloride 1,000 mL (05/14/19 0611)   piperacillin-tazobactam (ZOSYN)  IV     vancomycin     [START ON 05/15/2019] vancomycin  Flora Lipps, MD  [facility] 05/14/2019

## 2019-05-14 NOTE — Progress Notes (Signed)
Orthopedic Tech Progress Note Patient Details:  Erin Holt [birth year] [id number] RN called after talking to PA JEFFRY about swollen left arm. Ortho Devices Type of Ortho Device: Sling arm elevator Ortho Device/Splint Location: ULE Ortho Device/Splint Interventions: Adjustment, Application, Ordered   Post Interventions Patient Tolerated: Well Instructions Provided: Care of device, Adjustment of device   [name] 05/14/2019, 9:27 AM

## 2019-05-14 NOTE — Progress Notes (Signed)
Physical Therapy Treatment Patient Details Name: Erin Holt MRN: [id number] DOB: 1951-01-03 Today's Date: 05/14/2019    History of Present Illness 68 y.o. female with medical history significant of NASH cirrhosis, HTN, A.Fib on Xarelto, prior stroke. Admitted 05/08/2019 with c/o black stool and dark red blood from rectum with hemorrhagic shock, Intubatd 7/9-7/08/2019. EGD showed gastric erosions. During admission, pt with LUE swelling and redness, thought to be secondary to cellulitis.     PT Comments    Pt progressing towards goals. Mild unsteadiness with gait and pt using IV pole for stability. Pt requiring supervision for mobility tasks. Reviewed LE seated HEP with pt. Current recommendations appropriate. Will continue to follow acutely to maximize functional mobility independence and safety.    Follow Up Recommendations  No PT follow up;Supervision - Intermittent     Equipment Recommendations  None recommended by PT    Recommendations for Other Services       Precautions / Restrictions Precautions Precautions: Other (comment) Precaution Comments: Keep LUE elevated Required Braces or Orthoses: Other Brace Other Brace: Pt now with sling on LUE to keep elevated  Restrictions Weight Bearing Restrictions: No    Mobility  Bed Mobility               General bed mobility comments: OOB in chair upon entry   Transfers Overall transfer level: Needs assistance Equipment used: None Transfers: Sit to/from Stand Sit to Stand: Supervision         General transfer comment: Supervision for safety.   Ambulation/Gait Ambulation/Gait assistance: Supervision Gait Distance (Feet): 200 Feet Assistive device: IV Pole Gait Pattern/deviations: Step-through pattern;Decreased stride length Gait velocity: Decreased    General Gait Details: Supervision for safety. Pt using IV pole for increased steadiness. Educated about use of cane for increased stability at home. Pt keeping LUE  tucked during gait.    Stairs             Wheelchair Mobility    Modified Rankin (Stroke Patients Only)       Balance Overall balance assessment: Mild deficits observed, not formally tested                                          Cognition Arousal/Alertness: Awake/alert Behavior During Therapy: WFL for tasks assessed/performed Overall Cognitive Status: Within Functional Limits for tasks assessed                                        Exercises General Exercises - Lower Extremity Long Arc Quad: AROM;Both;10 reps;Seated Hip Flexion/Marching: AROM;Both;10 reps;Seated Toe Raises: AROM;Both;10 reps;Seated Heel Raises: AROM;Both;10 reps;Seated    General Comments General comments (skin integrity, edema, etc.): Educated about hand exercises to help with swelling.       Pertinent Vitals/Pain Pain Assessment: Faces Faces Pain Scale: Hurts a little bit Pain Location: L forearm Pain Descriptors / Indicators: Pressure;Tightness;Discomfort Pain Intervention(s): Limited activity within patient's tolerance;Monitored during session;Repositioned    Home Living                      Prior Function            PT Goals (current goals can now be found in the care plan section) Acute Rehab PT Goals Patient Stated Goal: for my left arm to  get better PT Goal Formulation: With patient Time For Goal Achievement: 05/25/19 Potential to Achieve Goals: Good Progress towards PT goals: Progressing toward goals    Frequency    Min 3X/week      PT Plan Current plan remains appropriate    Co-evaluation              AM-PAC PT "6 Clicks" Mobility   Outcome Measure  Help needed turning from your back to your side while in a flat bed without using bedrails?: None Help needed moving from lying on your back to sitting on the side of a flat bed without using bedrails?: None Help needed moving to and from a bed to a chair (including  a wheelchair)?: None Help needed standing up from a chair using your arms (e.g., wheelchair or bedside chair)?: A Little Help needed to walk in hospital room?: A Little Help needed climbing 3-5 steps with a railing? : A Little 6 Click Score: 21    End of Session   Activity Tolerance: Patient tolerated treatment well Patient left: in chair;with call bell/phone within reach;Other (comment)(with LUE elevated using sling ) Nurse Communication: Mobility status PT Visit Diagnosis: Other abnormalities of gait and mobility (R26.89);Muscle weakness (generalized) (M62.81);Difficulty in walking, not elsewhere classified (R26.2)     Time: 9373-4287 PT Time Calculation (min) (ACUTE ONLY): 13 min  Charges:  $Gait Training: 8-22 mins                     Leighton Ruff, PT, DPT  Acute Rehabilitation Services  Pager: [phone] Office: [phone]    Rudean Hitt 05/14/2019, 11:50 AM

## 2019-05-14 NOTE — Consult Note (Signed)
Patient has been seen and evaluated at bedside.  Patient has a complex array of medical problems.  She was admitted and hemodynamically unstable.  She had to undergo resuscitation including pressors according to the chart as well as blood transfusion.  After an ICU stay she has been stabilized.  It was felt that she had an IV infiltrated in her left upper extremity.  This was noted in the chart.  A CT scan has not revealed an obvious abscess and a Doppler did not show a blood clot in the deep veinous system.  I was asked to see in regards to compartment syndrome.  Patient is seen and examined at length.  She has blistering to the forearm and the dorsal radial portion of her proximal forearm has some significant signs of an infiltrate with cellulitic change.  Her compartments are not firm and she is intact to sensation and motor function distally with good refill and vascular competency.  The patient does not have pain on passive extension, pulselessness, paler, or other signs that herald a compartment syndrome at present time.  She does have swelling indicative of cellulitis and I would recommend holding off on any aggressive anticoagulants as well as covering her with antibiotics while she is in the hospital and thereafter.  The blisters should be left alone and allowed to decompress themselves.  Dressing changes and observation daily will be to rule.  She has not had maximum elevation we have fitted her with a arm elevator today in hopes to decrease the edema.  I would not be surprised if she has some degree of a hematoma underneath her skin however at present time there is no urgency for any surgical decompression as there is no compartment syndrome findings and certainly would like to minimize her surgical events if this can clear on its own accord.  We will be happy to follow along with you and make sure her course stay stable.  Erin Presswood MD

## 2019-05-15 ENCOUNTER — Other Ambulatory Visit: Payer: Self-pay | Admitting: *Deleted

## 2019-05-15 LAB — CBC
HCT: 26.2 % — ABNORMAL LOW (ref 36.0–46.0)
Hemoglobin: 7.7 g/dL — ABNORMAL LOW (ref 12.0–15.0)
MCH: 26.9 pg (ref 26.0–34.0)
MCHC: 29.4 g/dL — ABNORMAL LOW (ref 30.0–36.0)
MCV: 91.6 fL (ref 80.0–100.0)
Platelets: 189 10*3/uL (ref 150–400)
RBC: 2.86 MIL/uL — ABNORMAL LOW (ref 3.87–5.11)
RDW: 15.1 % (ref 11.5–15.5)
WBC: 13.4 10*3/uL — ABNORMAL HIGH (ref 4.0–10.5)
nRBC: 0 % (ref 0.0–0.2)

## 2019-05-15 LAB — BASIC METABOLIC PANEL
Anion gap: 9 (ref 5–15)
BUN: 22 mg/dL (ref 8–23)
CO2: 21 mmol/L — ABNORMAL LOW (ref 22–32)
Calcium: 8.2 mg/dL — ABNORMAL LOW (ref 8.9–10.3)
Chloride: 107 mmol/L (ref 98–111)
Creatinine, Ser: 1.1 mg/dL — ABNORMAL HIGH (ref 0.44–1.00)
GFR calc Af Amer: 60 mL/min — ABNORMAL LOW (ref >60)
GFR calc non Af Amer: 52 mL/min — ABNORMAL LOW (ref >60)
Glucose, Bld: 108 mg/dL — ABNORMAL HIGH (ref 70–99)
Potassium: 3.6 mmol/L (ref 3.5–5.1)
Sodium: 137 mmol/L (ref 135–145)

## 2019-05-15 NOTE — Progress Notes (Signed)
Upon entering patients room, patients dressing was partially unwrapped. Dressing was wet to touch. Dressing redone and arm placed back in arm elevator.

## 2019-05-15 NOTE — Consult Note (Signed)
Patient is without advancing changes.  Given her findings I would leave her dressing clean dry and intact continue elevation motion and continue observation at this time.  She was seen and examined today.   Once again I would favor a cellulitic process with possible hematoma as my note from yesterday reflects.  Certainly with no worsening we will continue to observe Erin Heider MD

## 2019-05-15 NOTE — Progress Notes (Signed)
PROGRESS NOTE  ARIBELLA VAVRA JJH:[id number] DOB: 1951/06/16 DOA: 05/08/2019 PCP: Leeroy Cha, MD   LOS: 7 days   Brief narrative: Patient is a 68 year old female with past medical history significant for Karlene Lineman cirrhosis, hypertension, atrial fibrillation on Xarelto and prior CVA presented to the hospital with black stool and dark red blood per rectum for 5 days or so prior to presentation.  Patient was intubated and admitted to ICU team.  Patient underwent EGD and colonoscopy without any clear source of bleeding on 05/12/19. EGD  revealed gastric erosions without any evidence of recent bleeding.  Hemoglobin has remained stable.  Patient has been extubated, and transferred to the hospitalist service.   Left forearm is swollen, firm to touch with some cellulitic changes and blisters.  CTof the left upper extremity did not show any evidence of localized abscess.   Ultrasound of the left upper extremity did not show any evidence of DVT.  Patient persisted to have tense swelling of the forearm with mild leukocytosis and low-grade fever so orthopedics was consulted on 05/14/2019  Subjective: Patient denies interval complaints.  Her arm has felt better with swelling no fever chills or rigor.  Is continuing to elevate the extremity.  Denies shortness of breath, chest pain, nausea or vomiting.  Assessment/Plan:  Principal Problem:   Left arm cellulitis Active Problems:   Permanent atrial fibrillation   AKI (acute kidney injury) ([place])   Liver cirrhosis (HCC)   GI bleed   Acute blood loss anemia   Hemorrhagic shock (HCC)   Respiratory insufficiency  Left forearm swelling/cellulitis: CT scan of the forearm  showed subcutaneous edema.  Ultrasound of the left upper extremity did not show any evidence of DVT.  Orthopedics on board.  No compartment syndrome.  There is a possibility of cellulitis with hematoma.  Continue arm elevation.  on vancomycin and Zosyn as per orthopedic recommendation..   Patient was on initially on Ancef.  Temp Max of 99.8 F.  Leukocytosis trending down from 14.7 to 13.4 today.  We will continue to follow progress.  Follow orthopedic recommendation.  GI bleed: status post EGD and colonoscopy. Source of bleeding remains unclear. Patient has history of Karlene Lineman cirrhosis, and was on Xarelto for atrial fibrillation. Patient is currently stable.  We will continue to hold Xarelto for a week as per GI recommendation.  Restart date 05/16/2019. hemoglobin of 7.7  today with a mild drop from yesterday but no evidence of bleeding..  She has been followed by GI has been started on Lasix and spironolactone.  Patient will have to follow-up with Dr. Therisa Doyne, GI as outpatient after discharge.  Continue oral Protonix.  Chronic kidney disease stage III versus acute kidney injury on chronic kidney disease stage III: Creatinine levels have improved.  Creatinine of 1.10 today.  Permanent atrial fibrillation: Continue Cardizem.  Xarelto on hold as per GI for at least 7 days after the procedure, procedure date 05/09/2019.  Rate controlled.  Hypertension: Blood pressure is stable at this time.  On Lasix and spironolactone.  Will closely monitor.  VTE Prophylaxis: SCD  Code Status: Full code  Family Communication: none  Disposition Plan: Home likely in 1 to 2 days, pending clinical improvement with left cellulitis.  Follow orthopedic recommendations on discharge.  Consultants:  GI, critical care  Orthopedics consulted 05/14/2019  Procedures:  Intubation, mechanical ventilation, extubation.  EGD, colonoscopy on 05/09/2019.  Antibiotics:  Vancomycin and zosyn > 05/14/19  Anti-infectives (From admission, onward)   Start  Dose/Rate Route Frequency Ordered Stop   05/15/19 1100  vancomycin (VANCOCIN) IVPB 1000 mg/200 mL premix     1,000 mg 200 mL/hr over 60 Minutes Intravenous Every 24 hours 05/14/19 1009     05/14/19 1900  piperacillin-tazobactam (ZOSYN) IVPB 3.375 g      3.375 g 12.5 mL/hr over 240 Minutes Intravenous Every 8 hours 05/14/19 1009     05/14/19 1015  vancomycin (VANCOCIN) 2,000 mg in sodium chloride 0.9 % 500 mL IVPB     2,000 mg 250 mL/hr over 120 Minutes Intravenous  Once 05/14/19 0944 05/14/19 1526   05/14/19 1000  piperacillin-tazobactam (ZOSYN) IVPB 3.375 g     3.375 g 100 mL/hr over 30 Minutes Intravenous  Once 05/14/19 0928 05/14/19 1115   05/14/19 0930  vancomycin (VANCOCIN) IVPB 1000 mg/200 mL premix  Status:  Discontinued     1,000 mg 200 mL/hr over 60 Minutes Intravenous  Once 05/14/19 0928 05/14/19 0941   05/12/19 2200  ceFAZolin (ANCEF) IVPB 2g/100 mL premix  Status:  Discontinued     2 g 200 mL/hr over 30 Minutes Intravenous Every 8 hours 05/12/19 1738 05/14/19 0928   05/10/19 1200  cefTRIAXone (ROCEPHIN) 2 g in sodium chloride 0.9 % 100 mL IVPB  Status:  Discontinued     2 g 200 mL/hr over 30 Minutes Intravenous Every 24 hours 05/10/19 1002 05/12/19 1738   05/09/19 1600  ciprofloxacin (CIPRO) IVPB 400 mg  Status:  Discontinued     400 mg 200 mL/hr over 60 Minutes Intravenous Every 12 hours 05/09/19 1502 05/10/19 1002     Objective: Vitals:   05/15/19 0502 05/15/19 0900  BP: 123/74 133/82  Pulse: 85 85  Resp: 20   Temp: 98.8 F (37.1 C)   SpO2: 100%     Intake/Output Summary (Last 24 hours) at 05/15/2019 0946 Last data filed at 05/15/2019 0701 Gross per 24 hour  Intake 1169.93 ml  Output 1250 ml  Net -80.07 ml   Filed Weights   05/13/19 0538 05/14/19 0421 05/15/19 0509  Weight: 106.2 kg 105.6 kg 107.7 kg   Body mass index is 40.75 kg/m.   Physical Exam:                                                                                                                                                              general: Morbidly obese not in obvious distress HENT: Normocephalic, pupils equally reacting to light and accommodation.  No scleral pallor or icterus noted. Oral mucosa is moist.  Chest:  Clear breath  sounds.  Diminished breath sounds bilaterally. No crackles or wheezes.  CVS: S1 &S2 heard. No murmur.  Regular rate and rhythm. Abdomen: Soft, nontender, nondistended.  Bowel sounds are heard.  Liver is not palpable, no abdominal mass palpated  Extremities: Left forearm edema and erythema with blisters covered with dressing and on elevation currently.  Lower extremity with mild pedal edema.Marland Kitchen Psych: Alert, awake and oriented, normal mood CNS:  No cranial nerve deficits.  Power equal in all extremities.  No sensory deficits noted.  No cerebellar signs.   Skin: Warm and dry.  Left upper extremity cellulitis with blister.   Data Review: I have personally reviewed the following laboratory data and studies,  CBC: Recent Labs  Lab 05/08/19 1751  05/11/19 0402  05/12/19 0245 05/12/19 1458 05/13/19 0228 05/14/19 0718 05/15/19 0343  WBC 14.0*   < > 12.3*  --  11.2*  --  13.5* 14.7* 13.4*  NEUTROABS 11.4*  --   --   --   --   --   --   --   --   HGB 4.9*   < > 8.4*   < > 8.7* 10.1* 8.2* 8.6* 7.7*  HCT 17.7*   < > 27.0*   < > 27.8* 32.3* 26.4* 27.8* 26.2*  MCV 95.7   < > 90.6  --  88.3  --  87.4 87.4 91.6  PLT 360   < > 191  --  203  --  180 175 189   < > = values in this interval not displayed.   Basic Metabolic Panel: Recent Labs  Lab 05/09/19 0349  05/10/19 0409 05/11/19 0402 05/12/19 0245 05/14/19 0718 05/15/19 0343  NA 144   < > 145 140 137 136 137  K 4.2   < > 2.8* 3.1* 3.7 3.5 3.6  CL 111  --  106 105 104 107 107  CO2 15*  --  26 24 22 23  21*  GLUCOSE 144*  --  119* 116* 134* 113* 108*  BUN 39*  --  39* 25* 19 20 22   CREATININE 1.75*  --  1.78* 1.25* 1.21* 1.11* 1.10*  CALCIUM 7.6*  --  7.2* 7.7* 8.2* 8.1* 8.2*  MG 2.1  --  1.9 2.6* 2.4 2.0  --   PHOS 6.7*  --  3.6 2.6 2.1*  --   --    < > = values in this interval not displayed.   Liver Function Tests: Recent Labs  Lab 05/08/19 1751 05/10/19 0409 05/11/19 0402 05/12/19 0245 05/14/19 0718  AST 29 52* 40 32 20  ALT  14 25 27 28 17   ALKPHOS 93 77 77 93 103  BILITOT 0.6 1.2 0.9 0.9 0.7  PROT 6.6 5.2* 5.2* 5.9* 5.8*  ALBUMIN 3.1* 2.5* 2.4* 2.7* 2.5*   No results for input(s): LIPASE, AMYLASE in the last 168 hours. No results for input(s): AMMONIA in the last 168 hours. Cardiac Enzymes: No results for input(s): CKTOTAL, CKMB, CKMBINDEX, TROPONINI in the last 168 hours. BNP (last 3 results) No results for input(s): BNP in the last 8760 hours.  ProBNP (last 3 results) No results for input(s): PROBNP in the last 8760 hours.  CBG: Recent Labs  Lab 05/08/19 1736 05/08/19 2220 05/09/19 0029 05/09/19 0351 05/10/19 0342  GLUCAP 166* 128* 175* 154* 115*   Recent Results (from the past 240 hour(s))  SARS Coronavirus 2 (CEPHEID - Performed in [place] hospital lab), Hosp Order     Status: None   Collection Time: 05/08/19  8:25 PM   Specimen: Nasopharyngeal Swab  Result Value Ref Range Status   SARS Coronavirus 2 NEGATIVE NEGATIVE Final    Comment: (NOTE) If result is NEGATIVE SARS-CoV-2 target nucleic acids are NOT DETECTED.  The SARS-CoV-2 RNA is generally detectable in upper and lower  respiratory specimens during the acute phase of infection. The lowest  concentration of SARS-CoV-2 viral copies this assay can detect is 250  copies / mL. A negative result does not preclude SARS-CoV-2 infection  and should not be used as the sole basis for treatment or other  patient management decisions.  A negative result may occur with  improper specimen collection / handling, submission of specimen other  than nasopharyngeal swab, presence of viral mutation(s) within the  areas targeted by this assay, and inadequate number of viral copies  (<250 copies / mL). A negative result must be combined with clinical  observations, patient history, and epidemiological information. If result is POSITIVE SARS-CoV-2 target nucleic acids are DETECTED. The SARS-CoV-2 RNA is generally detectable in upper and lower   respiratory specimens dur ing the acute phase of infection.  Positive  results are indicative of active infection with SARS-CoV-2.  Clinical  correlation with patient history and other diagnostic information is  necessary to determine patient infection status.  Positive results do  not rule out bacterial infection or co-infection with other viruses. If result is PRESUMPTIVE POSTIVE SARS-CoV-2 nucleic acids MAY BE PRESENT.   A presumptive positive result was obtained on the submitted specimen  and confirmed on repeat testing.  While 2019 novel coronavirus  (SARS-CoV-2) nucleic acids may be present in the submitted sample  additional confirmatory testing may be necessary for epidemiological  and / or clinical management purposes  to differentiate between  SARS-CoV-2 and other Sarbecovirus currently known to infect humans.  If clinically indicated additional testing with an alternate test  methodology [phone]) is advised. The SARS-CoV-2 RNA is generally  detectable in upper and lower respiratory sp ecimens during the acute  phase of infection. The expected result is Negative. Fact Sheet for Patients:  StrictlyIdeas.no Fact Sheet for Healthcare Providers: BankingDealers.co.za This test is not yet approved or cleared by the [place] FDA and has been authorized for detection and/or diagnosis of SARS-CoV-2 by FDA under an Emergency Use Authorization (EUA).  This EUA will remain in effect (meaning this test can be used) for the duration of the COVID-19 declaration under Section 564(b)(1) of the Act, 21 U.S.C. section 360bbb-3(b)(1), unless the authorization is terminated or revoked sooner. Performed at [facility], [place] [address] [place], [place] [address]   MRSA PCR Screening     Status: None   Collection Time: 05/08/19 11:47 PM   Specimen: Nasopharyngeal  Result Value Ref Range Status   MRSA by PCR NEGATIVE NEGATIVE Final     Comment:        The GeneXpert MRSA Assay (FDA approved for NASAL specimens only), is one component of a comprehensive MRSA colonization surveillance program. It is not intended to diagnose MRSA infection nor to guide or monitor treatment for MRSA infections. Performed at [facility], [place] [address] [place],  [address]      Studies: No results found.  Scheduled Meds: . sodium chloride   Intravenous Once  . allopurinol  100 mg Oral QPM  . atorvastatin  40 mg Oral QPM  . brimonidine  1 drop Both Eyes BID  . brinzolamide  1 drop Both Eyes BID  . diltiazem  240 mg Oral Daily  . feeding supplement (PRO-STAT SUGAR FREE 64)  30 mL Oral BID  . furosemide  20 mg Oral Daily  . latanoprost  1 drop Both Eyes QHS  . pantoprazole  40 mg Oral Daily  . sodium chloride flush  10-40 mL Intracatheter Q12H  . spironolactone  50 mg Oral Daily    Continuous Infusions: . sodium chloride Stopped (05/14/19 1530)  . piperacillin-tazobactam (ZOSYN)  IV 3.375 g (05/15/19 0231)  . vancomycin       Flora Lipps, MD  [facility] 05/15/2019

## 2019-05-15 NOTE — Progress Notes (Addendum)
Occupational Therapy Treatment Patient Details Name: Erin Holt MRN: [id number] DOB: 1951/08/24 Today's Date: 05/15/2019    History of present illness 68 y.o. female with medical history significant of NASH cirrhosis, HTN, A.Fib on Xarelto, prior stroke. Admitted 05/08/2019 with c/o black stool and dark red blood from rectum with hemorrhagic shock, Intubatd 7/9-7/08/2019. EGD showed gastric erosions. During admission, pt with LUE swelling and redness, thought to be secondary to cellulitis.    OT comments  Patient progressing well.  Completing ADLs, mobility in room, and simulated tub transfers with supervision today. Noted decreased edema in LUE, now with sling UE elevator in room.  Able to complete exercises to LUE without pain, pt reports tightness with elbow extension and supination (limited -5 degrees, and -20 degrees full ROM, respectively)--encouraged completion of these exercises to tolerance and gradually increase range. Functional coordination and use improving as edema decreases.  Pt reports increased B LE edema, encouraged elevation and exercises to LEs- RN notified. Activity tolerance progressing well. Will follow acutely.     Follow Up Recommendations  No OT follow up;Supervision - Intermittent    Equipment Recommendations  None recommended by OT    Recommendations for Other Services      Precautions / Restrictions Precautions Precautions: Other (comment) Precaution Comments: Keep LUE elevated Required Braces or Orthoses: Other Brace Other Brace: Pt now with sling on LUE to keep elevated  Restrictions Weight Bearing Restrictions: No       Mobility Bed Mobility               General bed mobility comments: OOB in chair upon entry   Transfers Overall transfer level: Needs assistance Equipment used: None Transfers: Sit to/from Stand Sit to Stand: Supervision         General transfer comment: Supervision for safety.     Balance Overall balance  assessment: Mild deficits observed, not formally tested                                         ADL either performed or assessed with clinical judgement   ADL Overall ADL's : Needs assistance/impaired     Grooming: Wash/dry hands;Wash/dry face;Supervision/safety;Standing   Upper Body Bathing: Supervision/ safety;Standing           Lower Body Dressing: Supervision/safety;Sit to/from stand Lower Body Dressing Details (indicate cue type and reason): adjust socks with supervision seated, sit to stand supervision Toilet Transfer: Supervision/safety;Ambulation       Tub/ Shower Transfer: Tub transfer;Supervision/safety;Shower seat;Ambulation Tub/Shower Transfer Details (indicate cue type and reason): demostrated UE support stepping over threshold, pt return demonstrated with supervision  Functional mobility during ADLs: Supervision/safety General ADL Comments: reviewed energy conservation techniques, L UE exercises, and safety      Vision       Perception     Praxis      Cognition Arousal/Alertness: Awake/alert Behavior During Therapy: WFL for tasks assessed/performed Overall Cognitive Status: Within Functional Limits for tasks assessed                                          Exercises Exercises: Other exercises Other Exercises Other Exercises: completed exercises to L UE x 10 reps: shoulder flexion/extension, elbow flexion/extension, supination/pronation, wrist flexion/extension, gross hand flexion/ extension    Shoulder Instructions  General Comments encouraged B LE exercises and elevation, as reports increased edema in feet    Pertinent Vitals/ Pain       Pain Assessment: No/denies pain  Home Living                                          Prior Functioning/Environment              Frequency  Min 2X/week        Progress Toward Goals  OT Goals(current goals can now be found in the care  plan section)  Progress towards OT goals: Progressing toward goals  Acute Rehab OT Goals Patient Stated Goal: for my left arm to get better OT Goal Formulation: With patient  Plan Discharge plan remains appropriate;Frequency remains appropriate    Co-evaluation                 AM-PAC OT "6 Clicks" Daily Activity     Outcome Measure   Help from another person eating meals?: A Little Help from another person taking care of personal grooming?: A Little Help from another person toileting, which includes using toliet, bedpan, or urinal?: A Little Help from another person bathing (including washing, rinsing, drying)?: A Little Help from another person to put on and taking off regular upper body clothing?: A Little Help from another person to put on and taking off regular lower body clothing?: A Little 6 Click Score: 18    End of Session    OT Visit Diagnosis: Pain;Muscle weakness (generalized) (M62.81)   Activity Tolerance Patient tolerated treatment well   Patient Left in chair;with call bell/phone within reach   Nurse Communication Mobility status;Other (comment)(pt reports increased edema in B LEs)        Time: 8338-2505 OT Time Calculation (min): 16 min  Charges: OT General Charges $OT Visit: 1 Visit OT Treatments $Self Care/Home Management : 8-22 mins  Delight Stare, [place] Pager [phone] Office [phone]    Delight Stare 05/15/2019, 4:35 PM

## 2019-05-15 NOTE — Patient Outreach (Signed)
  [place] [facility]) Care Management  05/15/2019  RANEISHA BRESS 1951-10-24 [id number]    Generated in error as pt remains hospitalized as a inpt. Will monitor for upcoming discharge for pending [facility] services.  Raina Mina, RN Care Management Coordinator [place] Office [phone]

## 2019-05-16 LAB — BASIC METABOLIC PANEL
Anion gap: 11 (ref 5–15)
BUN: 22 mg/dL (ref 8–23)
CO2: 22 mmol/L (ref 22–32)
Calcium: 8.5 mg/dL — ABNORMAL LOW (ref 8.9–10.3)
Chloride: 107 mmol/L (ref 98–111)
Creatinine, Ser: 1.19 mg/dL — ABNORMAL HIGH (ref 0.44–1.00)
GFR calc Af Amer: 54 mL/min — ABNORMAL LOW (ref >60)
GFR calc non Af Amer: 47 mL/min — ABNORMAL LOW (ref >60)
Glucose, Bld: 117 mg/dL — ABNORMAL HIGH (ref 70–99)
Potassium: 3.4 mmol/L — ABNORMAL LOW (ref 3.5–5.1)
Sodium: 140 mmol/L (ref 135–145)

## 2019-05-16 LAB — CBC
HCT: 27.6 % — ABNORMAL LOW (ref 36.0–46.0)
Hemoglobin: 8.4 g/dL — ABNORMAL LOW (ref 12.0–15.0)
MCH: 27.1 pg (ref 26.0–34.0)
MCHC: 30.4 g/dL (ref 30.0–36.0)
MCV: 89 fL (ref 80.0–100.0)
Platelets: 219 10*3/uL (ref 150–400)
RBC: 3.1 MIL/uL — ABNORMAL LOW (ref 3.87–5.11)
RDW: 15.1 % (ref 11.5–15.5)
WBC: 10.5 10*3/uL (ref 4.0–10.5)
nRBC: 0.2 % (ref 0.0–0.2)

## 2019-05-16 LAB — GLUCOSE, CAPILLARY: Glucose-Capillary: 117 mg/dL — ABNORMAL HIGH (ref 70–99)

## 2019-05-16 MED ORDER — RIVAROXABAN 20 MG PO TABS
20.0000 mg | ORAL_TABLET | Freq: Every day | ORAL | Status: DC
  Administered 2019-05-16 – 2019-05-17 (×2): 20 mg via ORAL
  Filled 2019-05-16 (×2): qty 1

## 2019-05-16 NOTE — Progress Notes (Signed)
Physical Therapy Treatment/ Discharge Patient Details Name: Erin Holt MRN: [id number] DOB: 1950/12/28 Today's Date: 05/16/2019    History of Present Illness 68 y.o. female with medical history significant of NASH cirrhosis, HTN, A.Fib on Xarelto, prior stroke. Admitted 05/08/2019 with c/o black stool and dark red blood from rectum with hemorrhagic shock, Intubated 7/9-7/08/2019. EGD showed gastric erosions. During admission, pt with LUE swelling and redness, thought to be secondary to cellulitis.    PT Comments    Pt with improved mobility and activity tolerance this session and able to perform transfers and gait without assist. Pt currently walking what she deems to be her baseline activity tolerance without assist or AD. Pt encouraged to continue bil LE HEP acutely for conditioning as well as walk in halls 3x/day. Pt verbalized understanding of all education and reports she feels comfortable walking and moving on her own. Will sign off with pt aware and agreeable.     Follow Up Recommendations  No PT follow up     Equipment Recommendations  None recommended by PT    Recommendations for Other Services       Precautions / Restrictions Precautions Precautions: Other (comment) Precaution Comments: Keep LUE elevated    Mobility  Bed Mobility               General bed mobility comments: OOB in chair upon entry   Transfers Overall transfer level: Modified independent Equipment used: None Transfers: Sit to/from Stand           General transfer comment: pt able to rise and lower to chair on her own including taking arm out of and placing it back in sling  Ambulation/Gait Ambulation/Gait assistance: Modified independent (Device/Increase time) Gait Distance (Feet): 240 Feet Assistive device: None Gait Pattern/deviations: Step-through pattern;Decreased stride length;Wide base of support   Gait velocity interpretation: >2.62 ft/sec, indicative of community  ambulatory General Gait Details: pt able to walk without assist of IV pole with increased BOS and good speed   Stairs             Wheelchair Mobility    Modified Rankin (Stroke Patients Only)       Balance Overall balance assessment: No apparent balance deficits (not formally assessed)   Sitting balance-Leahy Scale: Normal       Standing balance-Leahy Scale: Normal                              Cognition Arousal/Alertness: Awake/alert Behavior During Therapy: WFL for tasks assessed/performed Overall Cognitive Status: Within Functional Limits for tasks assessed                                        Exercises General Exercises - Lower Extremity Long Arc Quad: 20 reps;AROM;Both;Seated Hip Flexion/Marching: AROM;20 reps;Both;Seated    General Comments        Pertinent Vitals/Pain Pain Assessment: No/denies pain Pain Location: L forearm    Home Living                      Prior Function            PT Goals (current goals can now be found in the care plan section) Progress towards PT goals: Goals met/education completed, patient discharged from PT    Frequency  PT Plan Current plan remains appropriate    Co-evaluation              AM-PAC PT "6 Clicks" Mobility   Outcome Measure  Help needed turning from your back to your side while in a flat bed without using bedrails?: None Help needed moving from lying on your back to sitting on the side of a flat bed without using bedrails?: None Help needed moving to and from a bed to a chair (including a wheelchair)?: None Help needed standing up from a chair using your arms (e.g., wheelchair or bedside chair)?: None Help needed to walk in hospital room?: None Help needed climbing 3-5 steps with a railing? : None 6 Click Score: 24    End of Session   Activity Tolerance: Patient tolerated treatment well Patient left: in chair;with call bell/phone  within reach;Other (comment)(LUE elevated in sling) Nurse Communication: Mobility status PT Visit Diagnosis: Other abnormalities of gait and mobility (R26.89);Muscle weakness (generalized) (M62.81);Difficulty in walking, not elsewhere classified (R26.2)     Time: 1071-2524 PT Time Calculation (min) (ACUTE ONLY): 9 min  Charges:  $Gait Training: 8-22 mins                     Aggie Douse Pam Drown, PT Acute Rehabilitation Services Pager: [phone] Office: [place] 05/16/2019, 1:16 PM

## 2019-05-16 NOTE — Plan of Care (Signed)

## 2019-05-16 NOTE — Progress Notes (Signed)
PROGRESS NOTE  Erin Holt OVF:[id number] DOB: [birth year] DOA: 05/08/2019 PCP: Leeroy Cha, MD   LOS: 8 days   Brief narrative: Patient is a 68 year old female with past medical history significant for [place] cirrhosis, hypertension, atrial fibrillation on Xarelto and prior CVA presented to the hospital with black stool and dark red blood per rectum for 5 days or so prior to presentation.  Patient was intubated and admitted to ICU team.  Patient underwent EGD and colonoscopy without any clear source of bleeding on 05/12/19. EGD  revealed gastric erosions without any evidence of recent bleeding.  Hemoglobin has remained stable.  Patient has been extubated, and transferred to the hospitalist service.   Left forearm is swollen, firm to touch with some cellulitic changes and blisters.  CTof the left upper extremity did not show any evidence of localized abscess.   Ultrasound of the left upper extremity did not show any evidence of DVT.  Patient persisted to have tense swelling of the forearm with mild leukocytosis and low-grade fever so orthopedics was consulted on 05/14/2019.  Subjective: Today, patient denies interval complaints.  Mild pain on forearm on palpation.  Assessment/Plan:  Principal Problem:   Left arm cellulitis Active Problems:   Permanent atrial fibrillation   AKI (acute kidney injury) ([place])   Liver cirrhosis (HCC)   GI bleed   Acute blood loss anemia   Hemorrhagic shock (HCC)   Respiratory insufficiency  Left forearm swelling/cellulitis:  orthopedics on board and is following closely.  Continue current level of treatment. CT scan of the forearm  showed subcutaneous edema.  Ultrasound of the left upper extremity did not show any evidence of DVT.  Orthopedics on board.  No compartment syndrome.  There is a possibility of cellulitis with hematoma.  Continue arm elevation.  on vancomycin and Zosyn as per orthopedic recommendation.  Patient was on initially on Ancef.   Temp Max of 98.7 F.  Leukocytosis trending down from13.4 to 10.5 today.  We will continue to follow progress.  Follow orthopedic recommendation. Orthopedic to reassess on Saturday to see if the patient would need I&D.  GI bleed: status post EGD and colonoscopy. Source of bleeding remains unclear. Patient has history of Karlene Lineman cirrhosis, and was on Xarelto for atrial fibrillation. Patient is currently stable.    Patient is off was at [place] date 05/16/2019- will restart today.. hemoglobin of   8.4 today  With improvement from yesterday but no evidence of bleeding..  She has been followed by GI has been started on Lasix and spironolactone.  Patient will have to follow-up with Dr. Therisa Doyne, GI as outpatient after discharge.  Continue oral Protonix.  Chronic kidney disease stage III versus acute kidney injury on chronic kidney disease stage III: Creatinine levels have improved  And has remained stable  Permanent atrial fibrillation: Continue Cardizem.  Xarelto  To be initiated today    Hypertension: Blood pressure is stable at this time.  On Lasix and spironolactone.  Will closely monitor.  VTE Prophylaxis: SCD, will initiate enteral today and closely monitor hemoglobin.  Code Status: Full code  Family Communication: none  Disposition Plan: Likely in 2 to 3 days.   Consultants:  GI, critical care  Orthopedics  05/14/2019  Procedures:  Intubation, mechanical ventilation, extubation.  EGD, colonoscopy on 05/09/2019.  Antibiotics:  Vancomycin and zosyn > 05/14/19  Anti-infectives (From admission, onward)   Start     Dose/Rate Route Frequency Ordered Stop   05/15/19 1100  vancomycin (VANCOCIN) IVPB 1000  mg/200 mL premix     1,000 mg 200 mL/hr over 60 Minutes Intravenous Every 24 hours 05/14/19 1009     05/14/19 1900  piperacillin-tazobactam (ZOSYN) IVPB 3.375 g     3.375 g 12.5 mL/hr over 240 Minutes Intravenous Every 8 hours 05/14/19 1009     05/14/19 1015  vancomycin  (VANCOCIN) 2,000 mg in sodium chloride 0.9 % 500 mL IVPB     2,000 mg 250 mL/hr over 120 Minutes Intravenous  Once 05/14/19 0944 05/14/19 1526   05/14/19 1000  piperacillin-tazobactam (ZOSYN) IVPB 3.375 g     3.375 g 100 mL/hr over 30 Minutes Intravenous  Once 05/14/19 0928 05/14/19 1115   05/14/19 0930  vancomycin (VANCOCIN) IVPB 1000 mg/200 mL premix  Status:  Discontinued     1,000 mg 200 mL/hr over 60 Minutes Intravenous  Once 05/14/19 0928 05/14/19 0941   05/12/19 2200  ceFAZolin (ANCEF) IVPB 2g/100 mL premix  Status:  Discontinued     2 g 200 mL/hr over 30 Minutes Intravenous Every 8 hours 05/12/19 1738 05/14/19 0928   05/10/19 1200  cefTRIAXone (ROCEPHIN) 2 g in sodium chloride 0.9 % 100 mL IVPB  Status:  Discontinued     2 g 200 mL/hr over 30 Minutes Intravenous Every 24 hours 05/10/19 1002 05/12/19 1738   05/09/19 1600  ciprofloxacin (CIPRO) IVPB 400 mg  Status:  Discontinued     400 mg 200 mL/hr over 60 Minutes Intravenous Every 12 hours 05/09/19 1502 05/10/19 1002     Objective: Vitals:   05/16/19 0512 05/16/19 0950  BP: 139/88 130/80  Pulse: 91 91  Resp: 20   Temp: 98.7 F (37.1 C) 98.7 F (37.1 C)  SpO2: 100% 100%    Intake/Output Summary (Last 24 hours) at 05/16/2019 1017 Last data filed at 05/16/2019 0800 Gross per 24 hour  Intake 700 ml  Output 800 ml  Net -100 ml   Filed Weights   05/14/19 0421 05/15/19 0509 05/16/19 0512  Weight: 105.6 kg 107.7 kg 107.3 kg   Body mass index is 40.6 kg/m.   Physical Exam:             General appearance :Awake, alert, not in obvious distress. Speech Clear. Not toxic Looking.  Morbidly obese Eyes:  PERLA,no scleral icterus or pallor.  HEENT: No pharyngeal erythema. mucosa is moist Neck: supple, no JVD.  No palpable thyroid enlargement Resp:  Breath sounds diminished bilaterally.  No crackles or wheezes noted. CVS: S1 S2 heard, regular rate and rhythm,, no murmur heard.  GI: Bowel sounds present, Non tender and not  distended with no guarding, rigidity or rebound. No palpable masses noted Extremities: B/L Lower ext shows no edema, both legs are warm to touch Neurology:  speech clear, non focal, sensation is grossly intact. Psychiatric:  Alert and oriented x 3.  Skin: No Rash, warm and dry. Left upper extremity cellulitis with blister, forearm is still appears to be tense though the erythema has improved.         Data Review: I have personally reviewed the following laboratory data and studies,  CBC: Recent Labs  Lab 05/12/19 0245 05/12/19 1458 05/13/19 0228 05/14/19 0718 05/15/19 0343 05/16/19 0708  WBC 11.2*  --  13.5* 14.7* 13.4* 10.5  HGB 8.7* 10.1* 8.2* 8.6* 7.7* 8.4*  HCT 27.8* 32.3* 26.4* 27.8* 26.2* 27.6*  MCV 88.3  --  87.4 87.4 91.6 89.0  PLT 203  --  180 175 189 761   Basic Metabolic Panel: Recent  Labs  Lab 05/10/19 0409 05/11/19 0402 05/12/19 0245 05/14/19 0718 05/15/19 0343 05/16/19 0708  NA 145 140 137 136 137 140  K 2.8* 3.1* 3.7 3.5 3.6 3.4*  CL 106 105 104 107 107 107  CO2 26 24 22 23  21* 22  GLUCOSE 119* 116* 134* 113* 108* 117*  BUN 39* 25* 19 20 22 22   CREATININE 1.78* 1.25* 1.21* 1.11* 1.10* 1.19*  CALCIUM 7.2* 7.7* 8.2* 8.1* 8.2* 8.5*  MG 1.9 2.6* 2.4 2.0  --   --   PHOS 3.6 2.6 2.1*  --   --   --    Liver Function Tests: Recent Labs  Lab 05/10/19 0409 05/11/19 0402 05/12/19 0245 05/14/19 0718  AST 52* 40 32 20  ALT 25 27 28 17   ALKPHOS 77 77 93 103  BILITOT 1.2 0.9 0.9 0.7  PROT 5.2* 5.2* 5.9* 5.8*  ALBUMIN 2.5* 2.4* 2.7* 2.5*   No results for input(s): LIPASE, AMYLASE in the last 168 hours. No results for input(s): AMMONIA in the last 168 hours. Cardiac Enzymes: No results for input(s): CKTOTAL, CKMB, CKMBINDEX, TROPONINI in the last 168 hours. BNP (last 3 results) No results for input(s): BNP in the last 8760 hours.  ProBNP (last 3 results) No results for input(s): PROBNP in the last 8760 hours.  CBG: Recent Labs  Lab 05/10/19 0342   GLUCAP 115*   Recent Results (from the past 240 hour(s))  SARS Coronavirus 2 (CEPHEID - Performed in [facility] hospital lab), Hosp Order     Status: None   Collection Time: 05/08/19  8:25 PM   Specimen: Nasopharyngeal Swab  Result Value Ref Range Status   SARS Coronavirus 2 NEGATIVE NEGATIVE Final    Comment: (NOTE) If result is NEGATIVE SARS-CoV-2 target nucleic acids are NOT DETECTED. The SARS-CoV-2 RNA is generally detectable in upper and lower  respiratory specimens during the acute phase of infection. The lowest  concentration of SARS-CoV-2 viral copies this assay can detect is 250  copies / mL. A negative result does not preclude SARS-CoV-2 infection  and should not be used as the sole basis for treatment or other  patient management decisions.  A negative result may occur with  improper specimen collection / handling, submission of specimen other  than nasopharyngeal swab, presence of viral mutation(s) within the  areas targeted by this assay, and inadequate number of viral copies  (<250 copies / mL). A negative result must be combined with clinical  observations, patient history, and epidemiological information. If result is POSITIVE SARS-CoV-2 target nucleic acids are DETECTED. The SARS-CoV-2 RNA is generally detectable in upper and lower  respiratory specimens dur ing the acute phase of infection.  Positive  results are indicative of active infection with SARS-CoV-2.  Clinical  correlation with patient history and other diagnostic information is  necessary to determine patient infection status.  Positive results do  not rule out bacterial infection or co-infection with other viruses. If result is PRESUMPTIVE POSTIVE SARS-CoV-2 nucleic acids MAY BE PRESENT.   A presumptive positive result was obtained on the submitted specimen  and confirmed on repeat testing.  While 2019 novel coronavirus  (SARS-CoV-2) nucleic acids may be present in the submitted sample  additional  confirmatory testing may be necessary for epidemiological  and / or clinical management purposes  to differentiate between  SARS-CoV-2 and other Sarbecovirus currently known to infect humans.  If clinically indicated additional testing with an alternate test  methodology [phone]) is advised. The SARS-CoV-2 RNA  is generally  detectable in upper and lower respiratory sp ecimens during the acute  phase of infection. The expected result is Negative. Fact Sheet for Patients:  StrictlyIdeas.no Fact Sheet for Healthcare Providers: BankingDealers.co.za This test is not yet approved or cleared by the [place] FDA and has been authorized for detection and/or diagnosis of SARS-CoV-2 by FDA under an Emergency Use Authorization (EUA).  This EUA will remain in effect (meaning this test can be used) for the duration of the COVID-19 declaration under Section 564(b)(1) of the Act, 21 U.S.C. section 360bbb-3(b)(1), unless the authorization is terminated or revoked sooner. Performed at [facility], [place] [address] [place], [place] [address]   MRSA PCR Screening     Status: None   Collection Time: 05/08/19 11:47 PM   Specimen: Nasopharyngeal  Result Value Ref Range Status   MRSA by PCR NEGATIVE NEGATIVE Final    Comment:        The GeneXpert MRSA Assay (FDA approved for NASAL specimens only), is one component of a comprehensive MRSA colonization surveillance program. It is not intended to diagnose MRSA infection nor to guide or monitor treatment for MRSA infections. Performed at [facility], [place] [address] [place],  [address]      Studies: No results found.  Scheduled Meds: . sodium chloride   Intravenous Once  . allopurinol  100 mg Oral QPM  . atorvastatin  40 mg Oral QPM  . brimonidine  1 drop Both Eyes BID  . brinzolamide  1 drop Both Eyes BID  . diltiazem  240 mg Oral Daily  . feeding supplement (PRO-STAT  SUGAR FREE 64)  30 mL Oral BID  . furosemide  20 mg Oral Daily  . latanoprost  1 drop Both Eyes QHS  . pantoprazole  40 mg Oral Daily  . sodium chloride flush  10-40 mL Intracatheter Q12H  . spironolactone  50 mg Oral Daily    Continuous Infusions: . sodium chloride Stopped (05/14/19 1530)  . piperacillin-tazobactam (ZOSYN)  IV 3.375 g (05/16/19 0406)  . vancomycin Stopped (05/15/19 1315)     Flora Lipps, MD  [facility] 05/16/2019

## 2019-05-16 NOTE — Progress Notes (Signed)
Patient ID: Erin Holt, female   DOB: [birth year], 68 y.o.   MRN: [id number] Patient seen and examined at bedside.  No significant changes to the arm in terms of any worsening.  Her hand is neurovascularly intact she has excellent motor function.  Some of the blisters have began decompressing themselves.  At this time I would recommend elevation range of motion edema control and other measures to support the arm as this process continues to resolve and evolve.  I do not feel that any aggressive surgical intervention is warranted at this time.  Unfortunately the form still has an area in the subcutaneous tissue which is suggestive of infiltrate versus small bleed and this could evolve to require a incision and drainage but for now looks very stable over the course of the last 3 days when I have seen her.  Thus I do not recommend any surgical intervention and do not feel she has a compartment syndrome.  This is noted in my prior note.  We will continue to monitor with you.  I will plan to take a look at her Saturday.  Dressing changes should continue as well as elevation and edema control.  Danya Spearman MD cell phone [phone]

## 2019-05-16 NOTE — Plan of Care (Signed)
  Problem: Activity: Goal: Risk for activity intolerance will decrease Outcome: Completed/Met

## 2019-05-17 LAB — VANCOMYCIN, PEAK: Vancomycin Pk: 22 ug/mL — ABNORMAL LOW (ref 30–40)

## 2019-05-17 NOTE — Progress Notes (Signed)
PROGRESS NOTE  Erin Holt UXL:[id number] DOB: Oct 25, 1951 DOA: 05/08/2019 PCP: Leeroy Cha, MD   LOS: 9 days   Brief narrative: Patient is a 68 year old female with past medical history significant for Karlene Lineman cirrhosis, hypertension, atrial fibrillation on Xarelto and prior CVA presented to the hospital with black stool and dark red blood per rectum for 5 days or so prior to presentation.  Patient was intubated and admitted to ICU team.  Patient underwent EGD and colonoscopy without any clear source of bleeding on 05/12/19. EGD  revealed gastric erosions without any evidence of recent bleeding.  Hemoglobin has remained stable.  Patient has been extubated, and transferred to the hospitalist service.   Left forearm is swollen, firm to touch with some cellulitic changes and blisters.  CTof the left upper extremity did not show any evidence of localized abscess.   Ultrasound of the left upper extremity did not show any evidence of DVT.  Patient persisted to have tense swelling of the forearm with mild leukocytosis and low-grade fever so orthopedics was consulted on 05/14/2019.  Subjective:  Today, patient still complains of swelling and induration  of the forearm.  States that slightly increased edema but no fever, chills or rigor.  Denies any nausea, vomiting or diarrhea. Assessment/Plan:   Principal Problem:   Left arm cellulitis Active Problems:   Permanent atrial fibrillation   AKI (acute kidney injury) ([place])   Liver cirrhosis (HCC)   GI bleed   Acute blood loss anemia   Hemorrhagic shock (HCC)   Respiratory insufficiency  Left forearm swelling/cellulitis:  Orthopedics on board and is following closely.  CT scan of the forearm  showed subcutaneous edema.  Ultrasound of the left upper extremity did not show any evidence of DVT.  Orthopedics on board.  No compartment syndrome.  There is a possibility of cellulitis with hematoma.  Continue arm elevation.  on vancomycin and Zosyn as  per orthopedic recommendation.  Patient was on initially on Ancef.  Temp Max of 98.7 F.  Improved leukocytosis. Orthopedic to reassess on Saturday to see if the patient would need I&D.  GI bleed: status post EGD and colonoscopy. Source of bleeding remains unclear. Patient has history of Karlene Lineman cirrhosis, and was on Xarelto for atrial fibrillation. Patient is currently stable.    Restarted Eliquis.  Check CBC in a.m.   She has been followed by GI has been started on Lasix and spironolactone.  Patient will have to follow-up with Dr. Therisa Doyne, GI as outpatient after discharge.  Continue oral Protonix.  Chronic kidney disease stage III versus acute kidney injury on chronic kidney disease stage III:  Creatinine levels have improved  And has remained stable  Permanent atrial fibrillation: Continue Cardizem.  Continue Xarelto.   Hypertension: Blood pressure is stable at this time.  On Lasix and spironolactone.  Will closely monitor.  VTE Prophylaxis: Xarelto  Code Status: Full code  Family Communication: none   Disposition Plan: Likely in 2 to 3 days.   Consultants:  GI, critical care  Orthopedics  05/14/2019  Procedures:  Intubation, mechanical ventilation, extubation.  EGD, colonoscopy on 05/09/2019.  Antibiotics:  Vancomycin and zosyn > 05/14/19  Anti-infectives (From admission, onward)   Start     Dose/Rate Route Frequency Ordered Stop   05/15/19 1100  vancomycin (VANCOCIN) IVPB 1000 mg/200 mL premix     1,000 mg 200 mL/hr over 60 Minutes Intravenous Every 24 hours 05/14/19 1009     05/14/19 1900  piperacillin-tazobactam (ZOSYN) IVPB 3.375 g  3.375 g 12.5 mL/hr over 240 Minutes Intravenous Every 8 hours 05/14/19 1009     05/14/19 1015  vancomycin (VANCOCIN) 2,000 mg in sodium chloride 0.9 % 500 mL IVPB     2,000 mg 250 mL/hr over 120 Minutes Intravenous  Once 05/14/19 0944 05/14/19 1526   05/14/19 1000  piperacillin-tazobactam (ZOSYN) IVPB 3.375 g     3.375 g 100  mL/hr over 30 Minutes Intravenous  Once 05/14/19 0928 05/14/19 1115   05/14/19 0930  vancomycin (VANCOCIN) IVPB 1000 mg/200 mL premix  Status:  Discontinued     1,000 mg 200 mL/hr over 60 Minutes Intravenous  Once 05/14/19 0928 05/14/19 0941   05/12/19 2200  ceFAZolin (ANCEF) IVPB 2g/100 mL premix  Status:  Discontinued     2 g 200 mL/hr over 30 Minutes Intravenous Every 8 hours 05/12/19 1738 05/14/19 0928   05/10/19 1200  cefTRIAXone (ROCEPHIN) 2 g in sodium chloride 0.9 % 100 mL IVPB  Status:  Discontinued     2 g 200 mL/hr over 30 Minutes Intravenous Every 24 hours 05/10/19 1002 05/12/19 1738   05/09/19 1600  ciprofloxacin (CIPRO) IVPB 400 mg  Status:  Discontinued     400 mg 200 mL/hr over 60 Minutes Intravenous Every 12 hours 05/09/19 1502 05/10/19 1002     Objective: Vitals:   05/17/19 1014 05/17/19 1117  BP: 132/77 137/80  Pulse:  91  Resp:  18  Temp:  98 F (36.7 C)  SpO2:  100%    Intake/Output Summary (Last 24 hours) at 05/17/2019 1305 Last data filed at 05/17/2019 1231 Gross per 24 hour  Intake 560 ml  Output 1000 ml  Net -440 ml   Filed Weights   05/15/19 0509 05/16/19 0512 05/17/19 0536  Weight: 107.7 kg 107.3 kg 107.5 kg   Body mass index is 40.68 kg/m.   Physical Exam:  General:  Average built, not in obvious distress.  Morbidly obese HENT: Normocephalic, pupils equally reacting to light and accommodation.  No scleral pallor or icterus noted. Oral mucosa is moist.  Chest:  Clear breath sounds.  Diminished breath sounds bilaterally. No crackles or wheezes.  CVS: S1 &S2 heard. No murmur.  Regular rate and rhythm. Abdomen: Soft, nontender, nondistended.  Bowel sounds are heard.  Liver is not palpable, no abdominal mass palpated Extremities: Left upper extremity cellulitis Psych: Alert, awake and oriented, normal mood CNS:  No cranial nerve deficits.  Power equal in all extremities.  No sensory deficits noted.  No cerebellar signs.    Skin: Left upper  extremity cellulitis, mild erythema, forearm is still tense, edema of the arm.        Data Review: I have personally reviewed the following laboratory data and studies,  CBC: Recent Labs  Lab 05/12/19 0245 05/12/19 1458 05/13/19 0228 05/14/19 0718 05/15/19 0343 05/16/19 0708  WBC 11.2*  --  13.5* 14.7* 13.4* 10.5  HGB 8.7* 10.1* 8.2* 8.6* 7.7* 8.4*  HCT 27.8* 32.3* 26.4* 27.8* 26.2* 27.6*  MCV 88.3  --  87.4 87.4 91.6 89.0  PLT 203  --  180 175 189 295   Basic Metabolic Panel: Recent Labs  Lab 05/11/19 0402 05/12/19 0245 05/14/19 0718 05/15/19 0343 05/16/19 0708  NA 140 137 136 137 140  K 3.1* 3.7 3.5 3.6 3.4*  CL 105 104 107 107 107  CO2 24 22 23  21* 22  GLUCOSE 116* 134* 113* 108* 117*  BUN 25* 19 20 22 22   CREATININE 1.25* 1.21* 1.11* 1.10* 1.19*  CALCIUM 7.7* 8.2* 8.1* 8.2* 8.5*  MG 2.6* 2.4 2.0  --   --   PHOS 2.6 2.1*  --   --   --    Liver Function Tests: Recent Labs  Lab 05/11/19 0402 05/12/19 0245 05/14/19 0718  AST 40 32 20  ALT 27 28 17   ALKPHOS 77 93 103  BILITOT 0.9 0.9 0.7  PROT 5.2* 5.9* 5.8*  ALBUMIN 2.4* 2.7* 2.5*   No results for input(s): LIPASE, AMYLASE in the last 168 hours. No results for input(s): AMMONIA in the last 168 hours. Cardiac Enzymes: No results for input(s): CKTOTAL, CKMB, CKMBINDEX, TROPONINI in the last 168 hours. BNP (last 3 results) No results for input(s): BNP in the last 8760 hours.  ProBNP (last 3 results) No results for input(s): PROBNP in the last 8760 hours.  CBG: Recent Labs  Lab 05/16/19 1603  GLUCAP 117*   Recent Results (from the past 240 hour(s))  SARS Coronavirus 2 (CEPHEID - Performed in [place] hospital lab), Hosp Order     Status: None   Collection Time: 05/08/19  8:25 PM   Specimen: Nasopharyngeal Swab  Result Value Ref Range Status   SARS Coronavirus 2 NEGATIVE NEGATIVE Final    Comment: (NOTE) If result is NEGATIVE SARS-CoV-2 target nucleic acids are NOT DETECTED. The SARS-CoV-2  RNA is generally detectable in upper and lower  respiratory specimens during the acute phase of infection. The lowest  concentration of SARS-CoV-2 viral copies this assay can detect is 250  copies / mL. A negative result does not preclude SARS-CoV-2 infection  and should not be used as the sole basis for treatment or other  patient management decisions.  A negative result may occur with  improper specimen collection / handling, submission of specimen other  than nasopharyngeal swab, presence of viral mutation(s) within the  areas targeted by this assay, and inadequate number of viral copies  (<250 copies / mL). A negative result must be combined with clinical  observations, patient history, and epidemiological information. If result is POSITIVE SARS-CoV-2 target nucleic acids are DETECTED. The SARS-CoV-2 RNA is generally detectable in upper and lower  respiratory specimens dur ing the acute phase of infection.  Positive  results are indicative of active infection with SARS-CoV-2.  Clinical  correlation with patient history and other diagnostic information is  necessary to determine patient infection status.  Positive results do  not rule out bacterial infection or co-infection with other viruses. If result is PRESUMPTIVE POSTIVE SARS-CoV-2 nucleic acids MAY BE PRESENT.   A presumptive positive result was obtained on the submitted specimen  and confirmed on repeat testing.  While 2019 novel coronavirus  (SARS-CoV-2) nucleic acids may be present in the submitted sample  additional confirmatory testing may be necessary for epidemiological  and / or clinical management purposes  to differentiate between  SARS-CoV-2 and other Sarbecovirus currently known to infect humans.  If clinically indicated additional testing with an alternate test  methodology [phone]) is advised. The SARS-CoV-2 RNA is generally  detectable in upper and lower respiratory sp ecimens during the acute  phase of  infection. The expected result is Negative. Fact Sheet for Patients:  StrictlyIdeas.no Fact Sheet for Healthcare Providers: BankingDealers.co.za This test is not yet approved or cleared by the [place] FDA and has been authorized for detection and/or diagnosis of SARS-CoV-2 by FDA under an Emergency Use Authorization (EUA).  This EUA will remain in effect (meaning this test can be used) for the duration of  the COVID-19 declaration under Section 564(b)(1) of the Act, 21 U.S.C. section 360bbb-3(b)(1), unless the authorization is terminated or revoked sooner. Performed at [facility], [place] [address] [place], [place] [address]   MRSA PCR Screening     Status: None   Collection Time: 05/08/19 11:47 PM   Specimen: Nasopharyngeal  Result Value Ref Range Status   MRSA by PCR NEGATIVE NEGATIVE Final    Comment:        The GeneXpert MRSA Assay (FDA approved for NASAL specimens only), is one component of a comprehensive MRSA colonization surveillance program. It is not intended to diagnose MRSA infection nor to guide or monitor treatment for MRSA infections. Performed at [facility], [place] [address], [place], [place] [address]      Studies: No results found.  Scheduled Meds: . sodium chloride   Intravenous Once  . allopurinol  100 mg Oral QPM  . atorvastatin  40 mg Oral QPM  . brimonidine  1 drop Both Eyes BID  . brinzolamide  1 drop Both Eyes BID  . diltiazem  240 mg Oral Daily  . feeding supplement (PRO-STAT SUGAR FREE 64)  30 mL Oral BID  . furosemide  20 mg Oral Daily  . latanoprost  1 drop Both Eyes QHS  . pantoprazole  40 mg Oral Daily  . rivaroxaban  20 mg Oral Q supper  . sodium chloride flush  10-40 mL Intracatheter Q12H  . spironolactone  50 mg Oral Daily    Continuous Infusions: . sodium chloride Stopped (05/14/19 1530)  . piperacillin-tazobactam (ZOSYN)  IV 3.375 g (05/17/19 1018)  . vancomycin  1,000 mg (05/17/19 1043)     Flora Lipps, MD  [facility] 05/17/2019

## 2019-05-17 NOTE — Care Management Important Message (Signed)
Important Message  Patient Details  Name: SVEA PUSCH MRN: [id number] Date of Birth: 1951-01-30   Medicare Important Message Given:  Yes     [name] 05/17/2019, 12:00 PM

## 2019-05-18 ENCOUNTER — Inpatient Hospital Stay (HOSPITAL_COMMUNITY): Payer: Medicare Other

## 2019-05-18 LAB — BASIC METABOLIC PANEL
Anion gap: 9 (ref 5–15)
BUN: 19 mg/dL (ref 8–23)
CO2: 21 mmol/L — ABNORMAL LOW (ref 22–32)
Calcium: 8.4 mg/dL — ABNORMAL LOW (ref 8.9–10.3)
Chloride: 109 mmol/L (ref 98–111)
Creatinine, Ser: 1.07 mg/dL — ABNORMAL HIGH (ref 0.44–1.00)
GFR calc Af Amer: >60 mL/min (ref >60)
GFR calc non Af Amer: 53 mL/min — ABNORMAL LOW (ref >60)
Glucose, Bld: 111 mg/dL — ABNORMAL HIGH (ref 70–99)
Potassium: 3.7 mmol/L (ref 3.5–5.1)
Sodium: 139 mmol/L (ref 135–145)

## 2019-05-18 LAB — CBC
HCT: 24.7 % — ABNORMAL LOW (ref 36.0–46.0)
Hemoglobin: 7.5 g/dL — ABNORMAL LOW (ref 12.0–15.0)
MCH: 26.3 pg (ref 26.0–34.0)
MCHC: 30.4 g/dL (ref 30.0–36.0)
MCV: 86.7 fL (ref 80.0–100.0)
Platelets: 199 10*3/uL (ref 150–400)
RBC: 2.85 MIL/uL — ABNORMAL LOW (ref 3.87–5.11)
RDW: 15.2 % (ref 11.5–15.5)
WBC: 11.9 10*3/uL — ABNORMAL HIGH (ref 4.0–10.5)
nRBC: 0.2 % (ref 0.0–0.2)

## 2019-05-18 LAB — VANCOMYCIN, TROUGH: Vancomycin Tr: 8 ug/mL — ABNORMAL LOW (ref 15–20)

## 2019-05-18 MED ORDER — LORAZEPAM 2 MG/ML IJ SOLN
1.0000 mg | Freq: Once | INTRAMUSCULAR | Status: AC
  Administered 2019-05-18: 1 mg via INTRAVENOUS
  Filled 2019-05-18: qty 1

## 2019-05-18 MED ORDER — VANCOMYCIN HCL 10 G IV SOLR
1250.0000 mg | INTRAVENOUS | Status: DC
  Administered 2019-05-19: 1250 mg via INTRAVENOUS
  Filled 2019-05-18: qty 1250

## 2019-05-18 NOTE — Progress Notes (Signed)
PROGRESS NOTE  Erin Holt XBD:[id number] DOB: 1950-12-07 DOA: 05/08/2019 PCP: Leeroy Cha, MD   LOS: 10 days   Brief narrative: Patient is a 68 year old female with past medical history significant for Karlene Lineman cirrhosis, hypertension, atrial fibrillation on Xarelto and prior CVA presented to the hospital with black stool and dark red blood per rectum for 5 days or so prior to presentation.  Patient was intubated and admitted to ICU team.  Patient underwent EGD and colonoscopy without any clear source of bleeding on 05/12/19. EGD  revealed gastric erosions without any evidence of recent bleeding.  Hemoglobin has remained stable.  Patient has been extubated, and transferred to the hospitalist service.   Left forearm is swollen, firm to touch with some cellulitic changes and blisters.  CTof the left upper extremity did not show any evidence of localized abscess.   Ultrasound of the left upper extremity did not show any evidence of DVT.  Patient persisted to have tense swelling of the forearm with mild leukocytosis and low-grade fever so orthopedics was consulted on 05/14/2019.   Subjective:  Today, low-grade fever noted yesterday.  Patient denies overt pain in the arm but has persistent tense swelling of the forearm and increased arm swelling.  No significant improvement been noted.  Assessment/Plan:   Principal Problem:   Left arm cellulitis Active Problems:   Permanent atrial fibrillation   AKI (acute kidney injury) ([place])   Liver cirrhosis (HCC)   GI bleed   Acute blood loss anemia   Hemorrhagic shock (HCC)   Respiratory insufficiency  Left forearm swelling/cellulitis:  Orthopedics on board and is following closely.  CT scan of the forearm  showed subcutaneous edema.  Ultrasound of the left upper extremity did not show any evidence of DVT.  I spoke with orthopedics today.  Recommend MRI of the forearm for further evaluation.  Orthopedics on board.  No compartment syndrome.  There  is a possibility of cellulitis with hematoma.  Continue arm elevation.  on vancomycin and Zosyn as per orthopedic recommendation.  Did have low-grade fever yesterday and WBC of 11.9 today.  Orthopedics to reassess the patient today.  GI bleed: status post EGD and colonoscopy. Source of bleeding remains unclear. Patient has history of Karlene Lineman cirrhosis, and was on Xarelto for atrial fibrillation. Patient is currently stable.    Check CBC in a.m.   She has been followed by GI has been started on Lasix and spironolactone.  Patient will have to follow-up with Dr. Therisa Doyne, GI as outpatient after discharge.  Continue oral Protonix.  Chronic kidney disease stage III versus acute kidney injury on chronic kidney disease stage III:  Creatinine levels have improved  and has remained stable  Permanent atrial fibrillation: Continue Cardizem.  On Xarelto.   Hypertension: Blood pressure is stable at this time.  On Lasix and spironolactone.  Will closely monitor.  VTE Prophylaxis: Xarelto, will hold today for possible intervention.  Code Status: Full code  Family Communication: none   Disposition Plan: Likely in 2 to 3 days.  Orthopedics on board.  Check MRI of the forearm due to nonresolution of induration.  Possible need for I$D.   Consultants:  GI, critical care  Orthopedics  05/14/2019  Procedures:  Intubation, mechanical ventilation, extubation.  EGD, colonoscopy on 05/09/2019.  Antibiotics:  Vancomycin and zosyn > 05/14/19  Anti-infectives (From admission, onward)   Start     Dose/Rate Route Frequency Ordered Stop   05/15/19 1100  vancomycin (VANCOCIN) IVPB 1000 mg/200 mL premix  1,000 mg 200 mL/hr over 60 Minutes Intravenous Every 24 hours 05/14/19 1009     05/14/19 1900  piperacillin-tazobactam (ZOSYN) IVPB 3.375 g     3.375 g 12.5 mL/hr over 240 Minutes Intravenous Every 8 hours 05/14/19 1009     05/14/19 1015  vancomycin (VANCOCIN) 2,000 mg in sodium chloride 0.9 % 500 mL IVPB      2,000 mg 250 mL/hr over 120 Minutes Intravenous  Once 05/14/19 0944 05/14/19 1526   05/14/19 1000  piperacillin-tazobactam (ZOSYN) IVPB 3.375 g     3.375 g 100 mL/hr over 30 Minutes Intravenous  Once 05/14/19 0928 05/14/19 1115   05/14/19 0930  vancomycin (VANCOCIN) IVPB 1000 mg/200 mL premix  Status:  Discontinued     1,000 mg 200 mL/hr over 60 Minutes Intravenous  Once 05/14/19 0928 05/14/19 0941   05/12/19 2200  ceFAZolin (ANCEF) IVPB 2g/100 mL premix  Status:  Discontinued     2 g 200 mL/hr over 30 Minutes Intravenous Every 8 hours 05/12/19 1738 05/14/19 0928   05/10/19 1200  cefTRIAXone (ROCEPHIN) 2 g in sodium chloride 0.9 % 100 mL IVPB  Status:  Discontinued     2 g 200 mL/hr over 30 Minutes Intravenous Every 24 hours 05/10/19 1002 05/12/19 1738   05/09/19 1600  ciprofloxacin (CIPRO) IVPB 400 mg  Status:  Discontinued     400 mg 200 mL/hr over 60 Minutes Intravenous Every 12 hours 05/09/19 1502 05/10/19 1002     Objective: Vitals:   05/18/19 0555 05/18/19 0848  BP: (!) 144/84 140/79  Pulse: 91   Resp: 18   Temp: 98.7 F (37.1 C)   SpO2: 100%     Intake/Output Summary (Last 24 hours) at 05/18/2019 1032 Last data filed at 05/18/2019 0700 Gross per 24 hour  Intake 1760 ml  Output 1250 ml  Net 510 ml   Filed Weights   05/16/19 0512 05/17/19 0536 05/18/19 0555  Weight: 107.3 kg 107.5 kg 107.8 kg   Body mass index is 40.8 kg/m.   Physical Exam:  General:  Average built, not in obvious distress.  Morbidly obese HENT: Normocephalic, pupils equally reacting to light and accommodation.  No scleral pallor or icterus noted. Oral mucosa is moist.  Chest:  Clear breath sounds.  Diminished breath sounds bilaterally. No crackles or wheezes.  CVS: S1 &S2 heard. No murmur.  Regular rate and rhythm. Abdomen: Soft, nontender, nondistended.  Bowel sounds are heard.  Liver is not palpable, no abdominal mass palpated Extremities: Left upper extremity-swelling of the left  forearm. Psych: Alert, awake and oriented, normal mood CNS:  No cranial nerve deficits.  Power equal in all extremities.  No sensory deficits noted.  No cerebellar signs.    Skin: Left upper extremity -forearm with tense indurated area with tenderness mild erythema, left edematous patient is able to move all the fingers.       Data Review: I have personally reviewed the following laboratory data and studies,  CBC: Recent Labs  Lab 05/13/19 0228 05/14/19 0718 05/15/19 0343 05/16/19 0708 05/18/19 0740  WBC 13.5* 14.7* 13.4* 10.5 11.9*  HGB 8.2* 8.6* 7.7* 8.4* 7.5*  HCT 26.4* 27.8* 26.2* 27.6* 24.7*  MCV 87.4 87.4 91.6 89.0 86.7  PLT 180 175 189 219 408   Basic Metabolic Panel: Recent Labs  Lab 05/12/19 0245 05/14/19 0718 05/15/19 0343 05/16/19 0708 05/18/19 0740  NA 137 136 137 140 139  K 3.7 3.5 3.6 3.4* 3.7  CL 104 107 107 107 109  CO2 22 23 21* 22 21*  GLUCOSE 134* 113* 108* 117* 111*  BUN 19 20 22 22 19   CREATININE 1.21* 1.11* 1.10* 1.19* 1.07*  CALCIUM 8.2* 8.1* 8.2* 8.5* 8.4*  MG 2.4 2.0  --   --   --   PHOS 2.1*  --   --   --   --    Liver Function Tests: Recent Labs  Lab 05/12/19 0245 05/14/19 0718  AST 32 20  ALT 28 17  ALKPHOS 93 103  BILITOT 0.9 0.7  PROT 5.9* 5.8*  ALBUMIN 2.7* 2.5*   No results for input(s): LIPASE, AMYLASE in the last 168 hours. No results for input(s): AMMONIA in the last 168 hours. Cardiac Enzymes: No results for input(s): CKTOTAL, CKMB, CKMBINDEX, TROPONINI in the last 168 hours. BNP (last 3 results) No results for input(s): BNP in the last 8760 hours.  ProBNP (last 3 results) No results for input(s): PROBNP in the last 8760 hours.  CBG: Recent Labs  Lab 05/16/19 1603  GLUCAP 117*   Recent Results (from the past 240 hour(s))  SARS Coronavirus 2 (CEPHEID - Performed in [place] hospital lab), Hosp Order     Status: None   Collection Time: 05/08/19  8:25 PM   Specimen: Nasopharyngeal Swab  Result Value Ref  Range Status   SARS Coronavirus 2 NEGATIVE NEGATIVE Final    Comment: (NOTE) If result is NEGATIVE SARS-CoV-2 target nucleic acids are NOT DETECTED. The SARS-CoV-2 RNA is generally detectable in upper and lower  respiratory specimens during the acute phase of infection. The lowest  concentration of SARS-CoV-2 viral copies this assay can detect is 250  copies / mL. A negative result does not preclude SARS-CoV-2 infection  and should not be used as the sole basis for treatment or other  patient management decisions.  A negative result may occur with  improper specimen collection / handling, submission of specimen other  than nasopharyngeal swab, presence of viral mutation(s) within the  areas targeted by this assay, and inadequate number of viral copies  (<250 copies / mL). A negative result must be combined with clinical  observations, patient history, and epidemiological information. If result is POSITIVE SARS-CoV-2 target nucleic acids are DETECTED. The SARS-CoV-2 RNA is generally detectable in upper and lower  respiratory specimens dur ing the acute phase of infection.  Positive  results are indicative of active infection with SARS-CoV-2.  Clinical  correlation with patient history and other diagnostic information is  necessary to determine patient infection status.  Positive results do  not rule out bacterial infection or co-infection with other viruses. If result is PRESUMPTIVE POSTIVE SARS-CoV-2 nucleic acids MAY BE PRESENT.   A presumptive positive result was obtained on the submitted specimen  and confirmed on repeat testing.  While 2019 novel coronavirus  (SARS-CoV-2) nucleic acids may be present in the submitted sample  additional confirmatory testing may be necessary for epidemiological  and / or clinical management purposes  to differentiate between  SARS-CoV-2 and other Sarbecovirus currently known to infect humans.  If clinically indicated additional testing with an  alternate test  methodology [phone]) is advised. The SARS-CoV-2 RNA is generally  detectable in upper and lower respiratory sp ecimens during the acute  phase of infection. The expected result is Negative. Fact Sheet for Patients:  StrictlyIdeas.no Fact Sheet for Healthcare Providers: BankingDealers.co.za This test is not yet approved or cleared by the [place] FDA and has been authorized for detection and/or diagnosis of SARS-CoV-2 by  FDA under an Emergency Use Authorization (EUA).  This EUA will remain in effect (meaning this test can be used) for the duration of the COVID-19 declaration under Section 564(b)(1) of the Act, 21 U.S.C. section 360bbb-3(b)(1), unless the authorization is terminated or revoked sooner. Performed at [facility], [place] [address], [place], [place] [address]   MRSA PCR Screening     Status: None   Collection Time: 05/08/19 11:47 PM   Specimen: Nasopharyngeal  Result Value Ref Range Status   MRSA by PCR NEGATIVE NEGATIVE Final    Comment:        The GeneXpert MRSA Assay (FDA approved for NASAL specimens only), is one component of a comprehensive MRSA colonization surveillance program. It is not intended to diagnose MRSA infection nor to guide or monitor treatment for MRSA infections. Performed at [facility], [place] [address], [place], [place] [address]      Studies: No results found.  Scheduled Meds: . allopurinol  100 mg Oral QPM  . atorvastatin  40 mg Oral QPM  . brimonidine  1 drop Both Eyes BID  . brinzolamide  1 drop Both Eyes BID  . diltiazem  240 mg Oral Daily  . feeding supplement (PRO-STAT SUGAR FREE 64)  30 mL Oral BID  . furosemide  20 mg Oral Daily  . latanoprost  1 drop Both Eyes QHS  . pantoprazole  40 mg Oral Daily  . rivaroxaban  20 mg Oral Q supper  . sodium chloride flush  10-40 mL Intracatheter Q12H  . spironolactone  50 mg Oral Daily    Continuous  Infusions: . sodium chloride Stopped (05/14/19 1530)  . piperacillin-tazobactam (ZOSYN)  IV 3.375 g (05/18/19 0340)  . vancomycin Stopped (05/17/19 1143)     Flora Lipps, MD  [facility] 05/18/2019

## 2019-05-18 NOTE — Consult Note (Signed)
Patient seen and evaluated at bedside.  I reviewed her exam.  She still moves her fingers very nicely and her shoulder and elbow are functional in terms of flexion and extension.  She still maintains blistering over her forearm which is been present since the infiltration event.  I have gone back through the chart and reviewed this once again.  Her erythema is actually improved but she is tender to the touch of the forearm.  Her compartments are soft.  I do not see any signs of necrotizing fasciitis or compartment syndrome.  She does have a cellulitis.  She does have a history of a stroke as well in the distant past and has always had some tingling and coolness in the arm since the stroke.  This is noted in the chart per pulmonary critical care notes.  The patient is generally frustrated and would like for her arm to improve more swiftly.  I have washed the arm with soap and water.  She help me with this over the sink.  Following this I debrided the blister and then performed placement of Xeroform after my exam.  My exam would highlight the neurovascular exam which is competent and capable and actually lessening erythema.  Nevertheless she is still markedly swollen and has a very abnormal arm.  Given the suspected IV infiltration of a pressor agent I do feel that she is likely to have some degree of continued cellulitic change and even some inflammatory changes in her muscle.  Nevertheless, this can evolve into an abscess and despite the CT being negative and the DVT analysis being negative I would certainly keep a very close eye on her to make sure she is trending towards normal.  Her white blood cell count has gone up and down but stayed between 13 and 10 over the last few days.  I discussed this with medicine.  At present time my thoughts are to continue elevation IV antibiotics and general wound care for the blistering.  In addition to this I would certainly consider an MRI scan to rule out an  abscess.  I feel the MRI will show cellulitic change as well as inflammation in the musculature but am specifically looking for an abscess.  Her clinical examination would tell us that she has inflammation in her arms and cellulitic change.    I have discussed these issues with the patient and the hospitalist today.  We will continue to watch and observe her.  Her arm was bandaged and I discussed her care with nursing staff at bedside.  It is okay to wash her with soap and water followed by the Xeroform dressing change twice a day.  I have also once again requested ice wraps for the room and aggressive elevation.  Shamira Toutant MD

## 2019-05-18 NOTE — Progress Notes (Signed)
Pharmacy Antibiotic Note  Erin Holt is a 68 y.o. female admitted on 05/08/2019 with upper GI bleed and on 7/11 developed left forearm cellulitis.  Pharmacy has been consulted for vancomycin and Zosyn dosing.  Levels today calculate to AUC of 359, slightly below goal   Plan: Zosyn 3.375g IV q8h (4 hour infusion).  Increase Vancomycin to 1250 mg iv Q 24 hours  Follow up plans for de-escalation of antibiotics  Height: 5\' 4"  (162.6 cm) Weight: 237 lb 11.2 oz (107.8 kg) IBW/kg (Calculated) : 54.7  Temp (24hrs), Avg:99.5 F (37.5 C), Min:98.7 F (37.1 C), Max:100.2 F (37.9 C)  Recent Labs  Lab 05/12/19 0245 05/13/19 0228 05/14/19 0718 05/15/19 0343 05/16/19 0708 05/17/19 1408 05/18/19 0740 05/18/19 1100  WBC 11.2* 13.5* 14.7* 13.4* 10.5  --  11.9*  --   CREATININE 1.21*  --  1.11* 1.10* 1.19*  --  1.07*  --   VANCOTROUGH  --   --   --   --   --   --   --  8*  VANCOPEAK  --   --   --   --   --  22*  --   --     Estimated Creatinine Clearance: 60.3 mL/min (A) (by C-G formula based on SCr of 1.07 mg/dL (H)).    No Known Allergies  Antimicrobials this admission: Vanc 7/14 >> Zosyn 7/14 >> Cefazolin 7/12 >> 7/14 CTX 7/10 >> 712 Cipro 7/9 >> 7/10  Microbiology results: 7/8 MRSA PCR: negative  7/8 covid: negative   Thank you for allowing pharmacy to be a part of this patient's care. Anette Guarneri, PharmD  05/18/2019 12:23 PM

## 2019-05-18 NOTE — Consult Note (Signed)
MRI reviewed  No abscess Cellulitis and edema noted  Discussed with radiology to confirm my read and they agree Amedeo Plenty MD

## 2019-05-19 LAB — BASIC METABOLIC PANEL
Anion gap: 10 (ref 5–15)
BUN: 20 mg/dL (ref 8–23)
CO2: 20 mmol/L — ABNORMAL LOW (ref 22–32)
Calcium: 8.5 mg/dL — ABNORMAL LOW (ref 8.9–10.3)
Chloride: 110 mmol/L (ref 98–111)
Creatinine, Ser: 1.1 mg/dL — ABNORMAL HIGH (ref 0.44–1.00)
GFR calc Af Amer: 60 mL/min — ABNORMAL LOW (ref >60)
GFR calc non Af Amer: 52 mL/min — ABNORMAL LOW (ref >60)
Glucose, Bld: 114 mg/dL — ABNORMAL HIGH (ref 70–99)
Potassium: 3.6 mmol/L (ref 3.5–5.1)
Sodium: 140 mmol/L (ref 135–145)

## 2019-05-19 LAB — CBC
HCT: 27.3 % — ABNORMAL LOW (ref 36.0–46.0)
Hemoglobin: 8.2 g/dL — ABNORMAL LOW (ref 12.0–15.0)
MCH: 26 pg (ref 26.0–34.0)
MCHC: 30 g/dL (ref 30.0–36.0)
MCV: 86.7 fL (ref 80.0–100.0)
Platelets: 226 10*3/uL (ref 150–400)
RBC: 3.15 MIL/uL — ABNORMAL LOW (ref 3.87–5.11)
RDW: 15.3 % (ref 11.5–15.5)
WBC: 11.6 10*3/uL — ABNORMAL HIGH (ref 4.0–10.5)
nRBC: 0 % (ref 0.0–0.2)

## 2019-05-19 LAB — MAGNESIUM: Magnesium: 1.8 mg/dL (ref 1.7–2.4)

## 2019-05-19 MED ORDER — RIVAROXABAN 20 MG PO TABS
20.0000 mg | ORAL_TABLET | Freq: Every day | ORAL | Status: DC
  Administered 2019-05-19 – 2019-05-21 (×3): 20 mg via ORAL
  Filled 2019-05-19 (×3): qty 1

## 2019-05-19 NOTE — Consult Note (Signed)
Patient is seen and examined at bedside.  Patient is awake alert and oriented.  Patient moves her hand fingers and wrist quite well as well as her shoulder and elbow.  She still has persistent edema and mild cellulitic change in the arm.  I have reviewed her MRI as noted in the chart from last night's light note.  The MRI does not show any focal abscess.  The MRI would reveal cellulitic change.  There is no abscess.  There is not an official read in the chart but I reviewed this personally and the radiologist reviewed this with me last night.  Given all issues including her age, medical problems, extravasation potency/suggested pressor extravasation and other factors I feel that it will be a long course.  Nevertheless, with a negative ultrasound, CT scan and now MRI I do feel that time and observation with edema control efforts are going to be the rule.  I would continue antibiotics for the cellulitis.  Oftentimes in patients who have significant cellulitis and lots of adiposity cellulitic change will be harder to resolve.  If she continues to improve which I do see signs of clinically on a daily basis in a slow fashion I would hope that she could transition home Tuesday or Wednesday as long as her WBC and temperature do not show signs of worsening.  Once again, aggressive cellulitis can be oftentimes perplexing and difficult to treat.  A combination of strict elevation, patience and antibiotic treatment is typically the rule as long as there is no deep abscess or other factor to require surgical intervention.  We had this discussion at length.  I have once again washed her arm with her at her sink followed by application of Xeroform gauze and a arm wrap.  Once again we do not have an Ace wrap at bedside despite my request and thus I re-requested this again.  Hopefully we can procure an Ace wrap to help with the edema control.  The patient remains diligent about her elevation and this is  helping.  Is going to be very important that at least twice a day she washed her arm and apply Neosporin or Xeroform and then the wrap around the arm where the blisters are located.  She will need to do this when she transitions home thus it is important that we help her now to get a good feel for this and some self-confidence.  I discussed this with the nursing staff as well.   Labs remain relatively stable.  WBC noted at 11.6 today All questions have been encouraged and answered.  Khalilah Hoke MD

## 2019-05-19 NOTE — Progress Notes (Addendum)
PROGRESS NOTE  Erin Holt LPF:[id number] DOB: Jun 06, 1951 DOA: 05/08/2019 PCP: Leeroy Cha, MD   LOS: 11 days   Brief narrative: Patient is a 68 year old female with past medical history significant for Nash cirrhosis, hypertension, atrial fibrillation on Xarelto and prior CVA presented to the hospital with black stool and dark red blood per rectum for 5 days or so prior to presentation.  Patient was intubated and admitted to ICU team.  Patient underwent EGD and colonoscopy without any clear source of bleeding on 05/12/19. EGD  revealed gastric erosions without any evidence of recent bleeding.  Hemoglobin has remained stable.  Patient has been extubated, and transferred to the hospitalist service.   Left forearm is swollen, firm to touch with some cellulitic changes and blisters.  CTof the left upper extremity did not show any evidence of localized abscess.   Ultrasound of the left upper extremity did not show any evidence of DVT.  Patient persisted to have tense swelling of the forearm with mild leukocytosis and low-grade fever so orthopedics was consulted on 05/14/2019.   Subjective:  Today, denies further fever chills or rigor.  Still has persistent left upper extremity swelling.  Assessment/Plan:   Principal Problem:   Left arm cellulitis Active Problems:   Permanent atrial fibrillation   AKI (acute kidney injury) ([place])   Liver cirrhosis (H[facility])   GI bleed   Acute blood loss anemia   Hemorrhagic shock ([facility])   Respiratory insufficiency  Left forearm swelling/cellulitis:  Orthopedics on board.  CT scan of the forearm  showed subcutaneous edema.  Ultrasound of the left upper extremity did not show any evidence of DV. MRI of the forearm showed no evidence of abscess.   Continue arm elevation.  on vancomycin and Zosyn.  Will discontinue vancomycin today.  GI bleed: status post EGD and colonoscopy. Source of bleeding remains unclear. Patient has history of Karlene Lineman cirrhosis, and  was on Xarelto for atrial fibrillation. Patient is currently stable.    Check CBC in a.m.   on Lasix and spironolactone.  Patient will have to follow-up with Dr. Therisa Doyne, GI as outpatient after discharge.  Continue oral Protonix.  Chronic kidney disease stage III versus acute kidney injury on chronic kidney disease stage III:  Creatinine levels remained stable  Permanent atrial fibrillation: Continue Cardizem.  On Xarelto.    Hypertension: Blood pressure is stable at this time.  On Lasix and spironolactone.  Will closely monitor.  VTE Prophylaxis:  Xarelto, will resume Xarelto again.  Code Status: Full code  Family Communication: none   Disposition Plan: Likely in 2 to 3 days.  Will follow orthopedic recommendations prior to discharge.   Consultants:  GI, critical care  Orthopedics  05/14/2019  Procedures:  Intubation, mechanical ventilation, extubation.  EGD, colonoscopy on 05/09/2019.  Antibiotics:  Vancomycin and zosyn > 05/14/19  Anti-infectives (From admission, onward)   Start     Dose/Rate Route Frequency Ordered Stop   05/19/19 1100  vancomycin (VANCOCIN) 1,250 mg in sodium chloride 0.9 % 250 mL IVPB     1,250 mg 166.7 mL/hr over 90 Minutes Intravenous Every 24 hours 05/18/19 1225     05/15/19 1100  vancomycin (VANCOCIN) IVPB 1000 mg/200 mL premix  Status:  Discontinued     1,000 mg 200 mL/hr over 60 Minutes Intravenous Every 24 hours 05/14/19 1009 05/18/19 1225   05/14/19 1900  piperacillin-tazobactam (ZOSYN) IVPB 3.375 g     3.375 g 12.5 mL/hr over 240 Minutes Intravenous Every 8 hours 05/14/19 1009  05/14/19 1015  vancomycin (VANCOCIN) 2,000 mg in sodium chloride 0.9 % 500 mL IVPB     2,000 mg 250 mL/hr over 120 Minutes Intravenous  Once 05/14/19 0944 05/14/19 1526   05/14/19 1000  piperacillin-tazobactam (ZOSYN) IVPB 3.375 g     3.375 g 100 mL/hr over 30 Minutes Intravenous  Once 05/14/19 0928 05/14/19 1115   05/14/19 0930  vancomycin (VANCOCIN) IVPB  1000 mg/200 mL premix  Status:  Discontinued     1,000 mg 200 mL/hr over 60 Minutes Intravenous  Once 05/14/19 0928 05/14/19 0941   05/12/19 2200  ceFAZolin (ANCEF) IVPB 2g/100 mL premix  Status:  Discontinued     2 g 200 mL/hr over 30 Minutes Intravenous Every 8 hours 05/12/19 1738 05/14/19 0928   05/10/19 1200  cefTRIAXone (ROCEPHIN) 2 g in sodium chloride 0.9 % 100 mL IVPB  Status:  Discontinued     2 g 200 mL/hr over 30 Minutes Intravenous Every 24 hours 05/10/19 1002 05/12/19 1738   05/09/19 1600  ciprofloxacin (CIPRO) IVPB 400 mg  Status:  Discontinued     400 mg 200 mL/hr over 60 Minutes Intravenous Every 12 hours 05/09/19 1502 05/10/19 1002     Objective: Vitals:   05/19/19 1055 05/19/19 1236  BP: (!) 153/110 (!) 149/109  Pulse:  (!) 120  Resp:  20  Temp:  98.6 F (37 C)  SpO2:  96%    Intake/Output Summary (Last 24 hours) at 05/19/2019 1247 Last data filed at 05/19/2019 0500 Gross per 24 hour  Intake 533.64 ml  Output 951 ml  Net -417.36 ml   Filed Weights   05/17/19 0536 05/18/19 0555 05/19/19 0500  Weight: 107.5 kg 107.8 kg 107.2 kg   Body mass index is 40.58 kg/m.   Physical Exam: General: Morbidly obese.  Not in obvious distress HENT: Normocephalic, pupils equally reacting to light and accommodation.  No scleral pallor or icterus noted. Oral mucosa is moist.  Chest:  Clear breath sounds.  Diminished breath sounds bilaterally. No crackles or wheezes.  CVS: S1 &S2 heard. No murmur.  Regular rate and rhythm. Abdomen: Soft, nontender, nondistended.  Bowel sounds are heard.  Liver is not palpable, no abdominal mass palpated Extremities: Left upper extremity swelling of the forearm and arm with blisters currently on dressing. Psych: Alert, awake and oriented, normal mood CNS:  No cranial nerve deficits.  Power equal in all extremities.  No sensory deficits noted.  No cerebellar signs.   Skin: Left upper extremity edema, cellulitis and blisters       Data  Review: I have personally reviewed the following laboratory data and studies,  CBC: Recent Labs  Lab 05/14/19 0718 05/15/19 0343 05/16/19 0708 05/18/19 0740 05/19/19 0719  WBC 14.7* 13.4* 10.5 11.9* 11.6*  HGB 8.6* 7.7* 8.4* 7.5* 8.2*  HCT 27.8* 26.2* 27.6* 24.7* 27.3*  MCV 87.4 91.6 89.0 86.7 86.7  PLT 175 189 219 199 102   Basic Metabolic Panel: Recent Labs  Lab 05/14/19 0718 05/15/19 0343 05/16/19 0708 05/18/19 0740 05/19/19 0719  NA 136 137 140 139 140  K 3.5 3.6 3.4* 3.7 3.6  CL 107 107 107 109 110  CO2 23 21* 22 21* 20*  GLUCOSE 113* 108* 117* 111* 114*  BUN 20 22 22 19 20   CREATININE 1.11* 1.10* 1.19* 1.07* 1.10*  CALCIUM 8.1* 8.2* 8.5* 8.4* 8.5*  MG 2.0  --   --   --  1.8   Liver Function Tests: Recent Labs  Lab 05/14/19  0718  AST 20  ALT 17  ALKPHOS 103  BILITOT 0.7  PROT 5.8*  ALBUMIN 2.5*   No results for input(s): LIPASE, AMYLASE in the last 168 hours. No results for input(s): AMMONIA in the last 168 hours. Cardiac Enzymes: No results for input(s): CKTOTAL, CKMB, CKMBINDEX, TROPONINI in the last 168 hours. BNP (last 3 results) No results for input(s): BNP in the last 8760 hours.  ProBNP (last 3 results) No results for input(s): PROBNP in the last 8760 hours.  CBG: Recent Labs  Lab 05/16/19 1603  GLUCAP 117*   No results found for this or any previous visit (from the past 240 hour(s)).   Studies: Mr Forearm Left Wo Contrast  Result Date: 05/19/2019 CLINICAL DATA:  Forearm pain and fever. EXAM: MRI OF THE LEFT FOREARM WITHOUT CONTRAST TECHNIQUE: Multiplanar, multisequence MR imaging of the left forearm was performed. No intravenous contrast was administered. COMPARISON:  CT scan May 12, 2019 FINDINGS: Bones/Joint/Cartilage No significant abnormal osseous edema signal to suggest osteomyelitis. Upper normal amount of fluid in the elbow joint. Ligaments N/A Muscles and Tendons Diffuse muscular and fascial edema in the anterior, posterior, and  lateral compartments of the forearm without appreciable drainable abscess. Soft tissues Diffuse and worsened subcutaneous edema diffusely in the forearm, also tracking into the upper arm. No drainable abscess or appreciable gas in the soft tissues. Along the radial side of the distal forearm, several fluid signal intensities along the cutaneous surface are compatible with blisters. IMPRESSION: 1. Worsening diffuse subcutaneous edema in the forearm, accompanied by infiltrative edema in the musculature and fascial planes of all 3 compartments of the forearm. Appearance favors cellulitis with myositis/fasciitis. No gas is evident in the soft tissues. No abscess is identified. No findings of osteomyelitis at this time. 2. Blisters along the radial side of the distal forearm. Electronically Signed   By: Van Clines M.D.   On: 05/19/2019 09:37    Scheduled Meds:  allopurinol  100 mg Oral QPM   atorvastatin  40 mg Oral QPM   brimonidine  1 drop Both Eyes BID   brinzolamide  1 drop Both Eyes BID   diltiazem  240 mg Oral Daily   feeding supplement (PRO-STAT SUGAR FREE 64)  30 mL Oral BID   furosemide  20 mg Oral Daily   latanoprost  1 drop Both Eyes QHS   pantoprazole  40 mg Oral Daily   sodium chloride flush  10-40 mL Intracatheter Q12H   spironolactone  50 mg Oral Daily    Continuous Infusions:  sodium chloride Stopped (05/14/19 1530)   piperacillin-tazobactam (ZOSYN)  IV 3.375 g (05/19/19 1129)   vancomycin 1,250 mg (05/19/19 1130)     Flora Lipps, MD  [facility] 05/19/2019

## 2019-05-19 NOTE — Progress Notes (Signed)
ACE wrap at bedside, wrapped left arm both lower and upper arm per verbal instruction per Dr. Amedeo Plenty, MD.  Order for ACE was placed yesterday by myself but did not arrive to unit.  I made sure to expedite a new order for ACE wrap.  New Xeroform and Kerlix was applied on 7/17-18/19 BID.

## 2019-05-19 NOTE — Progress Notes (Signed)
Provided education to patient regarding the need to wash her arm this afternoon twice at 1400 and again at 1700.  Patient declined wanting to wash her arm and change dressing at this time.  Dressing changed this am by Dr. Amedeo Plenty, MD with ACE applied by myself.  Will pass along to night shift RN Ronalee Belts, RN) to encourage patient to have arm washed and new dressing with Xeroform, Kerlix, and ACE applied.

## 2019-05-20 MED ORDER — OXYMETAZOLINE HCL 0.05 % NA SOLN
3.0000 | Freq: Once | NASAL | Status: DC
  Filled 2019-05-20: qty 30

## 2019-05-20 NOTE — Progress Notes (Signed)
Nutrition Follow-up  DOCUMENTATION CODES:   Obesity unspecified  INTERVENTION:   -MVI with minerals daily -Continue 30 ml Prostat BID, each supplement provides 100 kcals and 15 grams protein  NUTRITION DIAGNOSIS:   Increased nutrient needs related to wound healing as evidenced by estimated needs.  Ongoing  GOAL:   Patient will meet greater than or equal to 90% of their needs  Progressing   MONITOR:   PO intake, Supplement acceptance, Skin, Weight trends, Labs, I & O's  REASON FOR ASSESSMENT:   Ventilator    ASSESSMENT:   68 yo female with recent dx of NASH cirrhosis admitted with admitted with massive GI bleed and hemorrhagic shock, severe metabolic acidosis, AKI. PMH includes HTN, stroke, A.fib on Xarelto  7/9- extubated  Reviewed I/O's: +824 ml x 24 hours and +7.8 L since admission  UOP: 200 ml x 24 hours   Pt remains with good appetite; noted meal completion 75-100%. She is also compliant with Prostat supplements.  Per MD notes, plan to transition from IV to PO antibiotics for possible discharge on 05/22/19.   Labs reviewed.   Diet Order:   Diet Order            Diet Heart Room service appropriate? Yes; Fluid consistency: Thin  Diet effective now              EDUCATION NEEDS:   Not appropriate for education at this time  Skin:  Skin Assessment: Skin Integrity Issues: Skin Integrity Issues:: Other (Comment) Other: non pressure wound L arm  Last BM:  05/19/19  Height:   Ht Readings from Last 1 Encounters:  05/11/19 5\' 4"  (1.626 m)    Weight:   Wt Readings from Last 1 Encounters:  05/20/19 107.7 kg    Ideal Body Weight:  54.5 kg  BMI:  Body mass index is 40.75 kg/m.  Estimated Nutritional Needs:   Kcal:  1850-2050  Protein:  110-125 grams  Fluid:  Per MD    Kymia Simi A. Jimmye Norman, RD, LDN, [place] Registered Dietitian II Certified Diabetes Care and Education Specialist Pager: [phone] After hours Pager: [phone]

## 2019-05-20 NOTE — Progress Notes (Addendum)
Occupational Therapy Treatment and Discharge  Patient Details Name: Erin Holt MRN: [id number] DOB: [birth year] Today's Date: 05/20/2019    History of present illness 68 y.o. female with medical history significant of NASH cirrhosis, HTN, A.Fib on Xarelto, prior stroke. Admitted 05/08/2019 with c/o black stool and dark red blood from rectum with hemorrhagic shock, Intubated 7/9-7/08/2019. EGD showed gastric erosions. During admission, pt with LUE swelling and redness, thought to be secondary to cellulitis.   OT comments  Patient progressing well.  Reports completing ADLs without assist daily.  Focus on reviewed of energy conservation techniques and safety with ADLs for home. Patient demonstrating completion of HEP (from memory) with independence to L UE with greatly improved AROM, at all joints but remains edematous in forearm and upper arm.  Issued squeeze ball and reviewed exercises.  Patient has met all OT goals and completing exercises without assist.  Encouraged pt to continue with exercises and agreeable.  No further questions or concerns.  OT will sign off.  If further needs arise, please re-consult.    Follow Up Recommendations  No OT follow up;Supervision - Intermittent    Equipment Recommendations  None recommended by OT    Recommendations for Other Services      Precautions / Restrictions Precautions Precautions: Other (comment) Precaution Comments: Keep LUE elevated Required Braces or Orthoses: Other Brace Other Brace: Pt now with sling on LUE to keep elevated  Restrictions Weight Bearing Restrictions: No       Mobility Bed Mobility                  Transfers                      Balance                                           ADL either performed or assessed with clinical judgement   ADL                                         General ADL Comments: reviewed safety with ADLs, using [place] for bathing in  shower and maintaining L UE elevation      Vision       Perception     Praxis      Cognition Arousal/Alertness: Awake/alert Behavior During Therapy: WFL for tasks assessed/performed Overall Cognitive Status: Within Functional Limits for tasks assessed                                          Exercises Exercises: Other exercises Other Exercises Other Exercises: completed exercises to L UE x 10 reps: shoulder flexion/extension, elbow flexion/extension, supination/pronation, wrist flexion/extension, gross hand flexion/ extension  Other Exercises: issued squeeze ball for LUE: completed 10 reps of gross grasp, pinch and tripod pinch    Shoulder Instructions       General Comments session focused on exercises to L UE     Pertinent Vitals/ Pain       Pain Assessment: No/denies pain  Home Living  Prior Functioning/Environment              Frequency  Min 2X/week        Progress Toward Goals  OT Goals(current goals can now be found in the care plan section)  Progress towards OT goals: Goals met/education completed, patient discharged from OT  Acute Rehab OT Goals Patient Stated Goal: for my left arm to get better OT Goal Formulation: With patient Time For Goal Achievement: 05/26/19 Potential to Achieve Goals: Good  Plan All goals met and education completed, patient discharged from OT services    Co-evaluation                 AM-PAC OT "6 Clicks" Daily Activity     Outcome Measure   Help from another person eating meals?: None Help from another person taking care of personal grooming?: None Help from another person toileting, which includes using toliet, bedpan, or urinal?: None Help from another person bathing (including washing, rinsing, drying)?: None Help from another person to put on and taking off regular upper body clothing?: None Help from another person to put on and  taking off regular lower body clothing?: None 6 Click Score: 24    End of Session    OT Visit Diagnosis: Pain;Muscle weakness (generalized) (M62.81)   Activity Tolerance Patient tolerated treatment well   Patient Left in bed;with call bell/phone within reach   Nurse Communication          Time: 9409-0502 OT Time Calculation (min): 11 min  Charges: OT General Charges $OT Visit: 1 Visit OT Treatments $Therapeutic Exercise: 8-22 mins  Delight Stare, OT Acute Rehabilitation Services Pager [phone] Office [phone]    Delight Stare 05/20/2019, 9:27 AM

## 2019-05-20 NOTE — Consult Note (Signed)
Patient seen and examined at bedside.  Her clinical course continues to improve slowly.  I have discussed all issues with the patient.  She is neurovascularly intact and has less edema and swelling.  I do feel that I approach of observation antibiotics elevation has continued to allow improvement.  There is certainly not worsening and oftentimes it does take quite a bit of time to catch up in these type of cellulitic events of the soft tissue.  Once again I do not see indications for surgical I&D.  I have reviewed her MRI personally.  At present juncture I would consider transitioning to p.o. antibiotics Tuesday and allow her to go home Wednesday if she has no trouble on the p.o. antibiotics as she feels very comfortable with washing and dressing changes on her arm which I have personally shown her multiple times and she is now performing herself  I would recommend consult with infectious disease for the best antibiotic orally given her complex medical history so that we do not cause any undue interactions.  Oftentimes a simple cephalosporin is the most reasonable answer in these situations but I would certainly ask infectious disease and move forward accordingly with p.o. transition Tuesday  Patient understands my thoughts and recommendations as I have verbalized them to her today.  Erin Maricle MD [phone]

## 2019-05-20 NOTE — Plan of Care (Signed)
  Problem: Education: Goal: Knowledge of General Education information will improve Description: Including pain rating scale, medication(s)/side effects and non-pharmacologic comfort measures Outcome: Progressing   Problem: Health Behavior/Discharge Planning: Goal: Ability to manage health-related needs will improve Outcome: Progressing   Problem: Clinical Measurements: Goal: Ability to maintain clinical measurements within normal limits will improve Outcome: Progressing Goal: Will remain free from infection Outcome: Progressing Goal: Diagnostic test results will improve Outcome: Progressing Goal: Cardiovascular complication will be avoided Outcome: Progressing   Problem: Skin Integrity: Goal: Risk for impaired skin integrity will decrease Outcome: Progressing

## 2019-05-20 NOTE — Care Management Important Message (Signed)
Important Message  Patient Details  Name: Erin Holt MRN: [id number] Date of Birth: 02/07/1951   Medicare Important Message Given:  Yes     [name] 05/20/2019, 1:30 PM

## 2019-05-20 NOTE — Progress Notes (Signed)
Arm has been washed and xeroform applied, wrapped with gauze and Ace wrap-twice today. Pt states that she elevates her arm in the arm sling.

## 2019-05-20 NOTE — Progress Notes (Signed)
PROGRESS NOTE  Erin Holt ZOX:[id number] DOB: 13-Dec-1950 DOA: 05/08/2019 PCP: Leeroy Cha, MD   LOS: 12 days   Brief narrative: Patient is a 68 year old female with past medical history significant for Nash cirrhosis, hypertension, atrial fibrillation on Xarelto and prior CVA presented to the hospital with black stool and dark red blood per rectum for 5 days or so prior to presentation.  Patient was intubated and admitted to ICU team.  Patient underwent EGD and colonoscopy without any clear source of bleeding on 05/12/19. EGD  revealed gastric erosions without any evidence of recent bleeding.  Hemoglobin has remained stable.  Patient has been extubated, and transferred to the hospitalist service.   Left forearm is swollen, firm to touch with some cellulitic changes and blisters.  CTof the left upper extremity did not show any evidence of localized abscess.   Ultrasound of the left upper extremity did not show any evidence of DVT.  Patient persisted to have tense swelling of the forearm with mild leukocytosis and low-grade fever so orthopedics was consulted on 05/14/2019.  MRI of the arm was performed which did not show any evidence of abscess. Patient was continued on antibiotics while in the hospital.   Subjective:  Today, complains of fatigue.  No dyspnea chest pain palpitation hematochezia.  Swelling persists but no increasing pain.  Able to move fingers.  Assessment/Plan:   Principal Problem:   Left arm cellulitis Active Problems:   Permanent atrial fibrillation   AKI (acute kidney injury) ([place])   Liver cirrhosis (HCC)   GI bleed   Acute blood loss anemia   Hemorrhagic shock (HCC)   Respiratory insufficiency  Left forearm swelling/cellulitis:  CT scan of the forearm  showed subcutaneous edema.  Ultrasound of the left upper extremity did not show any evidence of DVT. MRI of the forearm showed no evidence of abscess.  Continue arm elevation.  Continue IV Zosyn.  As per  orthopedic, will change to oral antibiotic tomorrow and potential disposition home by Wednesday if she continues to improve.  I spoke with Dr. Linus Salmons infectious disease regarding appropriate antibiotic.  Augmentin would be the choice on discharge.  GI bleed: status post EGD and colonoscopy. Source of bleeding remains unclear. Patient has history of Karlene Lineman cirrhosis, and was on Xarelto for atrial fibrillation. Patient is currently stable.    Hemoglobin is 8.2 today. on Lasix and spironolactone.  Patient will have to follow-up with Dr. Therisa Doyne, GI as outpatient after discharge.  Continue oral Protonix.  Chronic kidney disease stage III versus acute kidney injury on chronic kidney disease stage III:  Creatinine levels remained stable  Permanent atrial fibrillation: Continue Cardizem.  On Xarelto.    Hypertension: Blood pressure is stable at this time.  On Lasix and spironolactone.  Will closely monitor.  VTE Prophylaxis:  Xarelto  Code Status: Full code  Family Communication: none   Disposition Plan: Likely in 2  days.  Will follow orthopedic recommendations prior to discharge.   Consultants:  GI, critical care  Orthopedics  05/14/2019  Procedures:  Intubation, mechanical ventilation, extubation.  EGD, colonoscopy on 05/09/2019.  Antibiotics:  Vancomycin and zosyn > 05/14/19  Vancomycin DC'd>05/19/2019  Anti-infectives (From admission, onward)   Start     Dose/Rate Route Frequency Ordered Stop   05/19/19 1100  vancomycin (VANCOCIN) 1,250 mg in sodium chloride 0.9 % 250 mL IVPB  Status:  Discontinued     1,250 mg 166.7 mL/hr over 90 Minutes Intravenous Every 24 hours 05/18/19 1225 05/19/19 1251  05/15/19 1100  vancomycin (VANCOCIN) IVPB 1000 mg/200 mL premix  Status:  Discontinued     1,000 mg 200 mL/hr over 60 Minutes Intravenous Every 24 hours 05/14/19 1009 05/18/19 1225   05/14/19 1900  piperacillin-tazobactam (ZOSYN) IVPB 3.375 g     3.375 g 12.5 mL/hr over 240 Minutes  Intravenous Every 8 hours 05/14/19 1009     05/14/19 1015  vancomycin (VANCOCIN) 2,000 mg in sodium chloride 0.9 % 500 mL IVPB     2,000 mg 250 mL/hr over 120 Minutes Intravenous  Once 05/14/19 0944 05/14/19 1526   05/14/19 1000  piperacillin-tazobactam (ZOSYN) IVPB 3.375 g     3.375 g 100 mL/hr over 30 Minutes Intravenous  Once 05/14/19 0928 05/14/19 1115   05/14/19 0930  vancomycin (VANCOCIN) IVPB 1000 mg/200 mL premix  Status:  Discontinued     1,000 mg 200 mL/hr over 60 Minutes Intravenous  Once 05/14/19 0928 05/14/19 0941   05/12/19 2200  ceFAZolin (ANCEF) IVPB 2g/100 mL premix  Status:  Discontinued     2 g 200 mL/hr over 30 Minutes Intravenous Every 8 hours 05/12/19 1738 05/14/19 0928   05/10/19 1200  cefTRIAXone (ROCEPHIN) 2 g in sodium chloride 0.9 % 100 mL IVPB  Status:  Discontinued     2 g 200 mL/hr over 30 Minutes Intravenous Every 24 hours 05/10/19 1002 05/12/19 1738   05/09/19 1600  ciprofloxacin (CIPRO) IVPB 400 mg  Status:  Discontinued     400 mg 200 mL/hr over 60 Minutes Intravenous Every 12 hours 05/09/19 1502 05/10/19 1002     Objective: Vitals:   05/20/19 0603 05/20/19 0915  BP: 127/84 135/84  Pulse: 96 80  Resp: 16   Temp: 98.2 F (36.8 C)   SpO2: 100%     Intake/Output Summary (Last 24 hours) at 05/20/2019 1141 Last data filed at 05/20/2019 0748 Gross per 24 hour  Intake 783.9 ml  Output 325 ml  Net 458.9 ml   Filed Weights   05/18/19 0555 05/19/19 0500 05/20/19 0603  Weight: 107.8 kg 107.2 kg 107.7 kg   Body mass index is 40.75 kg/m.   Physical Exam: General: Morbidly obese.  Not in obvious distress HENT: Normocephalic, pupils equally reacting to light and accommodation.  No scleral pallor or icterus noted. Oral mucosa is moist.  Chest:  Clear breath sounds.  Diminished breath sounds bilaterally. No crackles or wheezes.  CVS: S1 &S2 heard. No murmur.  Regular rate and rhythm. Abdomen: Soft, nontender, nondistended.  Bowel sounds are heard.   Liver is not palpable, no abdominal mass palpated Extremities: Left upper extremity swelling edema and blisters, currently on elevation. Psych: Alert, awake and oriented, normal mood CNS:  No cranial nerve deficits.  Power equal in all extremities.  No sensory deficits noted.  No cerebellar signs.   Skin: Left upper extremity edema blisters     Data Review: I have personally reviewed the following laboratory data and studies,  CBC: Recent Labs  Lab 05/14/19 0718 05/15/19 0343 05/16/19 0708 05/18/19 0740 05/19/19 0719  WBC 14.7* 13.4* 10.5 11.9* 11.6*  HGB 8.6* 7.7* 8.4* 7.5* 8.2*  HCT 27.8* 26.2* 27.6* 24.7* 27.3*  MCV 87.4 91.6 89.0 86.7 86.7  PLT 175 189 219 199 161   Basic Metabolic Panel: Recent Labs  Lab 05/14/19 0718 05/15/19 0343 05/16/19 0708 05/18/19 0740 05/19/19 0719  NA 136 137 140 139 140  K 3.5 3.6 3.4* 3.7 3.6  CL 107 107 107 109 110  CO2 23 21* 22  21* 20*  GLUCOSE 113* 108* 117* 111* 114*  BUN 20 22 22 19 20   CREATININE 1.11* 1.10* 1.19* 1.07* 1.10*  CALCIUM 8.1* 8.2* 8.5* 8.4* 8.5*  MG 2.0  --   --   --  1.8   Liver Function Tests: Recent Labs  Lab 05/14/19 0718  AST 20  ALT 17  ALKPHOS 103  BILITOT 0.7  PROT 5.8*  ALBUMIN 2.5*   No results for input(s): LIPASE, AMYLASE in the last 168 hours. No results for input(s): AMMONIA in the last 168 hours. Cardiac Enzymes: No results for input(s): CKTOTAL, CKMB, CKMBINDEX, TROPONINI in the last 168 hours. BNP (last 3 results) No results for input(s): BNP in the last 8760 hours.  ProBNP (last 3 results) No results for input(s): PROBNP in the last 8760 hours.  CBG: Recent Labs  Lab 05/16/19 1603  GLUCAP 117*   No results found for this or any previous visit (from the past 240 hour(s)).   Studies: Mr Forearm Left Wo Contrast  Result Date: 05/19/2019 CLINICAL DATA:  Forearm pain and fever. EXAM: MRI OF THE LEFT FOREARM WITHOUT CONTRAST TECHNIQUE: Multiplanar, multisequence MR imaging of the  left forearm was performed. No intravenous contrast was administered. COMPARISON:  CT scan May 12, 2019 FINDINGS: Bones/Joint/Cartilage No significant abnormal osseous edema signal to suggest osteomyelitis. Upper normal amount of fluid in the elbow joint. Ligaments N/A Muscles and Tendons Diffuse muscular and fascial edema in the anterior, posterior, and lateral compartments of the forearm without appreciable drainable abscess. Soft tissues Diffuse and worsened subcutaneous edema diffusely in the forearm, also tracking into the upper arm. No drainable abscess or appreciable gas in the soft tissues. Along the radial side of the distal forearm, several fluid signal intensities along the cutaneous surface are compatible with blisters. IMPRESSION: 1. Worsening diffuse subcutaneous edema in the forearm, accompanied by infiltrative edema in the musculature and fascial planes of all 3 compartments of the forearm. Appearance favors cellulitis with myositis/fasciitis. No gas is evident in the soft tissues. No abscess is identified. No findings of osteomyelitis at this time. 2. Blisters along the radial side of the distal forearm. Electronically Signed   By: Van Clines M.D.   On: 05/19/2019 09:37    Scheduled Meds:  allopurinol  100 mg Oral QPM   atorvastatin  40 mg Oral QPM   brimonidine  1 drop Both Eyes BID   brinzolamide  1 drop Both Eyes BID   diltiazem  240 mg Oral Daily   feeding supplement (PRO-STAT SUGAR FREE 64)  30 mL Oral BID   furosemide  20 mg Oral Daily   latanoprost  1 drop Both Eyes QHS   pantoprazole  40 mg Oral Daily   rivaroxaban  20 mg Oral Q supper   sodium chloride flush  10-40 mL Intracatheter Q12H   spironolactone  50 mg Oral Daily    Continuous Infusions:  sodium chloride 1,000 mL (05/20/19 1121)   piperacillin-tazobactam (ZOSYN)  IV 3.375 g (05/20/19 1123)     Flora Lipps, MD  [facility] 05/20/2019

## 2019-05-21 LAB — CBC
HCT: 27.6 % — ABNORMAL LOW (ref 36.0–46.0)
Hemoglobin: 8.2 g/dL — ABNORMAL LOW (ref 12.0–15.0)
MCH: 25.8 pg — ABNORMAL LOW (ref 26.0–34.0)
MCHC: 29.7 g/dL — ABNORMAL LOW (ref 30.0–36.0)
MCV: 86.8 fL (ref 80.0–100.0)
Platelets: 333 10*3/uL (ref 150–400)
RBC: 3.18 MIL/uL — ABNORMAL LOW (ref 3.87–5.11)
RDW: 15.5 % (ref 11.5–15.5)
WBC: 9.1 10*3/uL (ref 4.0–10.5)
nRBC: 0.2 % (ref 0.0–0.2)

## 2019-05-21 MED ORDER — AMOXICILLIN-POT CLAVULANATE 875-125 MG PO TABS
1.0000 | ORAL_TABLET | Freq: Two times a day (BID) | ORAL | Status: DC
  Administered 2019-05-21 – 2019-05-22 (×3): 1 via ORAL
  Filled 2019-05-21 (×3): qty 1

## 2019-05-21 MED ORDER — FUROSEMIDE 10 MG/ML IJ SOLN
40.0000 mg | Freq: Once | INTRAMUSCULAR | Status: AC
  Administered 2019-05-21: 40 mg via INTRAVENOUS
  Filled 2019-05-21: qty 4

## 2019-05-21 NOTE — Progress Notes (Addendum)
Pt had visitor in room and didn't want dressing changed at this time.

## 2019-05-21 NOTE — Consult Note (Signed)
Patient seen and evaluated at bedside.  The patient notes no worsening.  Today her white blood cell count has trended into the normal range.  Her arm is much improved.  It is soft non-tender and making great strides.  I am quite pleased to see this.  I do feel that the treatment algorithm outlined has been quite effective.  I discussed with her that she will transition to Augmentin as an outpatient.  I would recommend 14 days of Augmentin and to return to see me in my office Monday at 1 PM for follow-up.  I discussed with her all issues plans and concerns.  At the time of evaluation her arm is markedly improved and I am quite pleased to see in her doing so well.  Shalinda Burkholder MD

## 2019-05-21 NOTE — Plan of Care (Signed)

## 2019-05-21 NOTE — Progress Notes (Signed)
Pharmacy Antibiotic Note  Erin Holt is a 68 y.o. female admitted on 05/08/2019 with forearm cellulits.  Pharmacy has been consulted for Zosyn dosing.  D: zosyn D7 for L forearm cellulitis. No evidence of abscess or DVT. WBC 18.1>>11.6, afeb.  LUE swelling- possiblity of cellulitis with hematoma. MRI negative  Vanc 7/14 >>7/19 Zosyn 7/14 >> Cefazolin 7/12 >> 7/14 CTX 7/10 >> 7/12 Cipro 7/9 >> 7/10  7/8 MRSA PCR: negative  7/8 covid: negative    Plan: Zosyn 3.375g q8h- Recommend change to Augmentin Pharmacy will sign off. Please reconsult for further dosing assitance.   Height: 5\' 4"  (162.6 cm) Weight: 232 lb (105.2 kg)(scale b) IBW/kg (Calculated) : 54.7  Temp (24hrs), Avg:98.9 F (37.2 C), Min:98.7 F (37.1 C), Max:99.3 F (37.4 C)  Recent Labs  Lab 05/15/19 0343 05/16/19 0708 05/17/19 1408 05/18/19 0740 05/18/19 1100 05/19/19 0719  WBC 13.4* 10.5  --  11.9*  --  11.6*  CREATININE 1.10* 1.19*  --  1.07*  --  1.10*  VANCOTROUGH  --   --   --   --  8*  --   VANCOPEAK  --   --  22*  --   --   --     Estimated Creatinine Clearance: 57.9 mL/min (A) (by C-G formula based on SCr of 1.1 mg/dL (H)).    No Known Allergies   Dreydon Cardenas S. Alford Highland, PharmD, BCPS Clinical Staff Pharmacist Eilene Ghazi Stillinger 05/21/2019 9:51 AM

## 2019-05-21 NOTE — Progress Notes (Addendum)
PROGRESS NOTE  Erin Holt PYK:[id number] DOB: 08/11/1951 DOA: 05/08/2019 PCP: Leeroy Cha, MD   LOS: 13 days   Brief narrative: Patient is a 68 year old female with past medical history significant for Nash cirrhosis, hypertension, atrial fibrillation on Xarelto and prior CVA presented to the hospital with black stool and dark red blood per rectum for 5 days or so prior to presentation.  Patient was intubated and admitted to ICU team.  Patient underwent EGD and colonoscopy without any clear source of bleeding on 05/12/19. EGD  revealed gastric erosions without any evidence of recent bleeding.  Hemoglobin has remained stable.  Patient has been extubated, and transferred to the hospitalist service.   Left forearm is swollen, firm to touch with some cellulitic changes and blisters.  CTof the left upper extremity did not show any evidence of localized abscess.   Ultrasound of the left upper extremity did not show any evidence of DVT.  Patient persisted to have tense swelling of the forearm with mild leukocytosis and low-grade fever so orthopedics was consulted on 05/14/2019.  MRI of the arm was performed which did not show any evidence of abscess.  Patient was then seen by orthopedics who continued conservative treatment and was on IV antibiotics.      Subjective:  Today, has interval complaints.  Swelling of the has started to improve.  Has mild fatigue.  Denies any chest pain, palpitation, fever or chills.  Assessment/Plan:   Principal Problem:   Left arm cellulitis Active Problems:   Permanent atrial fibrillation   AKI (acute kidney injury) ([place])   Liver cirrhosis (HCC)   GI bleed   Acute blood loss anemia   Hemorrhagic shock (HCC)   Respiratory insufficiency  Left forearm cellulitis: Improving with conservative treatment.  On IV antibiotics with Zosyn orthopedics on board.   Orthopedic is closely following the patient.  CT scan of the forearm  showed subcutaneous edema.   Ultrasound of the left upper extremity did not show any evidence of DVT. MRI of the forearm showed no evidence of abscess.  Continue arm elevation.  As per orthopedics, will change to oral antibiotic today and potential disposition home by Wednesday if she continues to improve.  ID recommends Augmentin on discharge and will change to Augmentin today.    GI bleed: status post EGD and colonoscopy. Source of bleeding remains unclear. Patient has history of Karlene Lineman cirrhosis, and was on Xarelto for atrial fibrillation.  CBC from today is pending. Patient is currently stable.    Last hemoglobin from 7/19 hemoglobin of 8.2.  On Lasix and spironolactone.  Patient will have to follow-up with Dr. Therisa Doyne, GI as outpatient after discharge.  Continue oral Protonix.  Chronic kidney disease stage III versus acute kidney injury on chronic kidney disease stage III:  Creatinine levels remained stable.  Patient is on low-dose Lasix.  Will give 40 mg of IV Lasix today x1 due to peripheral edema from ongoing IV antibiotics and fluids..  Permanent atrial fibrillation: Continue Cardizem.  On Xarelto.    Hypertension: Blood pressure is stable at this time.  On Lasix and spironolactone.  Will closely monitor.  VTE Prophylaxis:  Xarelto  Code Status: Full code  Family Communication: none   Disposition Plan: Likely home by tomorrow with oral Augmentin.  Will follow orthopedic recommendations prior to discharge.   Consultants:  GI, critical care  Orthopedics  05/14/2019  Procedures:  Intubation, mechanical ventilation, extubation.  EGD, colonoscopy on 05/09/2019.  Antibiotics:  Vancomycin and zosyn >  05/14/19  Vancomycin DC'd>05/19/2019  Anti-infectives (From admission, onward)   Start     Dose/Rate Route Frequency Ordered Stop   05/21/19 1030  amoxicillin-clavulanate (AUGMENTIN) 875-125 MG per tablet 1 tablet     1 tablet Oral Every 12 hours 05/21/19 0959     05/19/19 1100  vancomycin (VANCOCIN) 1,250  mg in sodium chloride 0.9 % 250 mL IVPB  Status:  Discontinued     1,250 mg 166.7 mL/hr over 90 Minutes Intravenous Every 24 hours 05/18/19 1225 05/19/19 1251   05/15/19 1100  vancomycin (VANCOCIN) IVPB 1000 mg/200 mL premix  Status:  Discontinued     1,000 mg 200 mL/hr over 60 Minutes Intravenous Every 24 hours 05/14/19 1009 05/18/19 1225   05/14/19 1900  piperacillin-tazobactam (ZOSYN) IVPB 3.375 g  Status:  Discontinued     3.375 g 12.5 mL/hr over 240 Minutes Intravenous Every 8 hours 05/14/19 1009 05/21/19 0959   05/14/19 1015  vancomycin (VANCOCIN) 2,000 mg in sodium chloride 0.9 % 500 mL IVPB     2,000 mg 250 mL/hr over 120 Minutes Intravenous  Once 05/14/19 0944 05/14/19 1526   05/14/19 1000  piperacillin-tazobactam (ZOSYN) IVPB 3.375 g     3.375 g 100 mL/hr over 30 Minutes Intravenous  Once 05/14/19 0928 05/14/19 1115   05/14/19 0930  vancomycin (VANCOCIN) IVPB 1000 mg/200 mL premix  Status:  Discontinued     1,000 mg 200 mL/hr over 60 Minutes Intravenous  Once 05/14/19 0928 05/14/19 0941   05/12/19 2200  ceFAZolin (ANCEF) IVPB 2g/100 mL premix  Status:  Discontinued     2 g 200 mL/hr over 30 Minutes Intravenous Every 8 hours 05/12/19 1738 05/14/19 0928   05/10/19 1200  cefTRIAXone (ROCEPHIN) 2 g in sodium chloride 0.9 % 100 mL IVPB  Status:  Discontinued     2 g 200 mL/hr over 30 Minutes Intravenous Every 24 hours 05/10/19 1002 05/12/19 1738   05/09/19 1600  ciprofloxacin (CIPRO) IVPB 400 mg  Status:  Discontinued     400 mg 200 mL/hr over 60 Minutes Intravenous Every 12 hours 05/09/19 1502 05/10/19 1002     Objective: Vitals:   05/21/19 0411 05/21/19 0821  BP: 118/73 138/77  Pulse: 95 94  Resp: 18   Temp: 98.7 F (37.1 C)   SpO2: 96%     Intake/Output Summary (Last 24 hours) at 05/21/2019 1117 Last data filed at 05/21/2019 0952 Gross per 24 hour  Intake 1235.08 ml  Output 800 ml  Net 435.08 ml   Filed Weights   05/19/19 0500 05/20/19 0603 05/21/19 0406   Weight: 107.2 kg 107.7 kg 105.2 kg   Body mass index is 39.82 kg/m.   Physical Exam: General: Morbidly obese.  Not in obvious distress HENT: Normocephalic, pupils equally reacting to light and accommodation.  No scleral pallor or icterus noted. Oral mucosa is moist.  Chest:  Clear breath sounds.  Diminished breath sounds bilaterally. No crackles or wheezes.  CVS: S1 &S2 heard. No murmur.  Regular rate and rhythm. Abdomen: Soft, nontender, nondistended.  Bowel sounds are heard.  Liver is not palpable, no abdominal mass palpated Extremities: Left upper extremity swelling edema and blisters, currently on elevation. Psych: Alert, awake and oriented, normal mood CNS:  No cranial nerve deficits.  Power equal in all extremities.  No sensory deficits noted.  No cerebellar signs.   Skin: Left upper extremity edema blisters     Data Review: I have personally reviewed the following laboratory data and studies,  CBC:  Recent Labs  Lab 05/15/19 0343 05/16/19 0708 05/18/19 0740 05/19/19 0719  WBC 13.4* 10.5 11.9* 11.6*  HGB 7.7* 8.4* 7.5* 8.2*  HCT 26.2* 27.6* 24.7* 27.3*  MCV 91.6 89.0 86.7 86.7  PLT 189 219 199 458   Basic Metabolic Panel: Recent Labs  Lab 05/15/19 0343 05/16/19 0708 05/18/19 0740 05/19/19 0719  NA 137 140 139 140  K 3.6 3.4* 3.7 3.6  CL 107 107 109 110  CO2 21* 22 21* 20*  GLUCOSE 108* 117* 111* 114*  BUN 22 22 19 20   CREATININE 1.10* 1.19* 1.07* 1.10*  CALCIUM 8.2* 8.5* 8.4* 8.5*  MG  --   --   --  1.8   Liver Function Tests: No results for input(s): AST, ALT, ALKPHOS, BILITOT, PROT, ALBUMIN in the last 168 hours. No results for input(s): LIPASE, AMYLASE in the last 168 hours. No results for input(s): AMMONIA in the last 168 hours. Cardiac Enzymes: No results for input(s): CKTOTAL, CKMB, CKMBINDEX, TROPONINI in the last 168 hours. BNP (last 3 results) No results for input(s): BNP in the last 8760 hours.  ProBNP (last 3 results) No results for  input(s): PROBNP in the last 8760 hours.  CBG: Recent Labs  Lab 05/16/19 1603  GLUCAP 117*   No results found for this or any previous visit (from the past 240 hour(s)).   Studies: No results found.  Scheduled Meds: . allopurinol  100 mg Oral QPM  . amoxicillin-clavulanate  1 tablet Oral Q12H  . atorvastatin  40 mg Oral QPM  . brimonidine  1 drop Both Eyes BID  . brinzolamide  1 drop Both Eyes BID  . diltiazem  240 mg Oral Daily  . feeding supplement (PRO-STAT SUGAR FREE 64)  30 mL Oral BID  . furosemide  20 mg Oral Daily  . latanoprost  1 drop Both Eyes QHS  . oxymetazoline  3 spray Each Nare Once  . pantoprazole  40 mg Oral Daily  . rivaroxaban  20 mg Oral Q supper  . sodium chloride flush  10-40 mL Intracatheter Q12H  . spironolactone  50 mg Oral Daily    Continuous Infusions: . sodium chloride 1,000 mL (05/20/19 1121)     Flora Lipps, MD  [facility] 05/21/2019

## 2019-05-22 ENCOUNTER — Other Ambulatory Visit: Payer: Self-pay | Admitting: Cardiology

## 2019-05-22 DIAGNOSIS — L03114 Cellulitis of left upper limb: Secondary | ICD-10-CM

## 2019-05-22 MED ORDER — SPIRONOLACTONE 50 MG PO TABS: 50.0000 mg | ORAL_TABLET | Freq: Every day | ORAL | 1 refills | Status: AC

## 2019-05-22 MED ORDER — AMOXICILLIN-POT CLAVULANATE 875-125 MG PO TABS: 1.0000 | ORAL_TABLET | Freq: Two times a day (BID) | ORAL | 0 refills | Status: AC

## 2019-05-22 MED ORDER — PANTOPRAZOLE SODIUM 40 MG PO TBEC: 40.0000 mg | DELAYED_RELEASE_TABLET | Freq: Every day | ORAL | 1 refills | Status: AC

## 2019-05-22 MED ORDER — FUROSEMIDE 20 MG PO TABS: 20.0000 mg | ORAL_TABLET | Freq: Every day | ORAL | 1 refills | Status: AC

## 2019-05-22 NOTE — Progress Notes (Signed)
Discussed discharge instruction with patient, including medications and follow up appointments.    Encouraged patient to monitor daily weight and edema in legs

## 2019-05-22 NOTE — Evaluation (Signed)
Physical Therapy Evaluation Patient Details Name: Erin Holt MRN: [id number] DOB: Feb 06, 1951 Today's Date: 05/22/2019   History of Present Illness  68 y.o. female with medical history significant of NASH cirrhosis just recently diagnosed, hasnt had EGD yet, HTN, A.Fib on Xarelto, prior stroke.Admitted 05/08/2019 with c/o black stool and dark red blood from rectum, recieved 1 unit PRBC in ED. Intubatd 7/9-7/08/2019. EGD showed gastric erosions. No varices. One clip placed. Upper and Lower GI negative for bleeding source.  Clinical Impression  Pt impatient for discharge and requires encouragement to work with therapy. Pt is mod I for mobility, but does experience DoE with short distance ambulation. Pt educated on need for short bouts of mobility to build endurance, as well as asking for help from friends with iADLs until she is stronger. Pt verbalizes understanding. PT finished education and is discharging from services. Pt requires no follow up services or equipment.     Follow Up Recommendations No PT follow up;Supervision - Intermittent    Equipment Recommendations  None recommended by PT       Precautions / Restrictions Precautions Precautions: Other (comment) Precaution Comments: Keep LUE elevated Required Braces or Orthoses: Other Brace Other Brace: Pt now with sling on LUE to keep elevated  Restrictions Weight Bearing Restrictions: No      Mobility  Bed Mobility               General bed mobility comments: OOB in recliner on entry  Transfers Overall transfer level: Modified independent   Transfers: Sit to/from Stand           General transfer comment: increased effort to come to standing, good hand placement for power up from recliner  Ambulation/Gait Ambulation/Gait assistance: Modified independent (Device/Increase time) Gait Distance (Feet): 200 Feet   Gait Pattern/deviations: Step-through pattern;Decreased step length - right;Decreased step length -  left Gait velocity: slowed Gait velocity interpretation: 1.31 - 2.62 ft/sec, indicative of limited community ambulator General Gait Details: mod I for increased time and effort, pt with 3/4 DoE at end of ambulation,         Balance Overall balance assessment: Needs assistance Sitting-balance support: Feet supported;No upper extremity supported Sitting balance-Leahy Scale: Good     Standing balance support: No upper extremity supported;During functional activity Standing balance-Leahy Scale: Fair                               Pertinent Vitals/Pain Pain Assessment: Faces Faces Pain Scale: Hurts a little bit Pain Location: L forearm Pain Descriptors / Indicators: Pressure;Tightness Pain Intervention(s): Limited activity within patient's tolerance;Monitored during session;Repositioned    Home Living Family/patient expects to be discharged to:: Private residence Living Arrangements: Alone Available Help at Discharge: Friend(s);Available PRN/intermittently Type of Home: House Home Access: Stairs to enter   [employer] of Steps: 1 Home Layout: One level Home Equipment: None      Prior Function Level of Independence: Independent         Comments: working     Journalist, newspaper        Extremity/Trunk Assessment   Upper Extremity Assessment Upper Extremity Assessment: LUE deficits/detail LUE Deficits / Details: L arm and hand edema limiting ROM and grip strength    Lower Extremity Assessment Lower Extremity Assessment: Overall WFL for tasks assessed       Communication   Communication: No difficulties  Cognition Arousal/Alertness: Awake/alert Behavior During Therapy: WFL for tasks assessed/performed Overall Cognitive Status:  Within Functional Limits for tasks assessed                                        General Comments General comments (skin integrity, edema, etc.): L forearm wrapped in clean, dry ACE bandage, pt  experienced 3/4 DoE with ambulation, vc for pacing. Pt encouraged to ask for assistance from friends for iADLs and to avoid the heat of the day if she does need to go out. max HR 116bpm SaO2>92%O2 throughout session on RA        Assessment/Plan    PT Assessment Patent does not need any further PT services  PT Problem List Decreased strength;Decreased activity tolerance;Decreased mobility       PT Treatment Interventions      PT Goals (Current goals can be found in the Care Plan section)  Acute Rehab PT Goals Patient Stated Goal: go home  PT Goal Formulation: With patient Time For Goal Achievement: 05/25/19 Potential to Achieve Goals: Good    Frequency     Barriers to discharge Decreased caregiver support         AM-PAC PT "6 Clicks" Mobility  Outcome Measure Help needed turning from your back to your side while in a flat bed without using bedrails?: None Help needed moving from lying on your back to sitting on the side of a flat bed without using bedrails?: None Help needed moving to and from a bed to a chair (including a wheelchair)?: None Help needed standing up from a chair using your arms (e.g., wheelchair or bedside chair)?: None Help needed to walk in hospital room?: None Help needed climbing 3-5 steps with a railing? : A Little 6 Click Score: 23    End of Session   Activity Tolerance: Patient tolerated treatment well Patient left: in chair;with call bell/phone within reach Nurse Communication: Mobility status PT Visit Diagnosis: Other abnormalities of gait and mobility (R26.89);Muscle weakness (generalized) (M62.81);Difficulty in walking, not elsewhere classified (R26.2)    Time: 2202-5427 PT Time Calculation (min) (ACUTE ONLY): 11 min   Charges:   PT Evaluation $PT Re-evaluation: 1 Re-eval          Lemond Griffee B. Migdalia Dk PT, DPT Acute Rehabilitation Services Pager [phone] Office [phone]   [place] 05/22/2019, 10:46  AM

## 2019-05-22 NOTE — TOC Transition Note (Signed)
Transition of Care [facility]) - CM/SW Discharge Note   Patient Details  Name: Erin Holt MRN: [id number] Date of Birth: 03-17-1951  [facility] CM/SW Contact:  Zenon Mayo, RN Phone Number: 05/22/2019, 3:54 PM   Clinical Narrative:    Patient from home alone, NCM offered choice for [facility] , she refused [facility] services states she does not need it and she plans to go back to work next week.   Final next level of care: [place] Barriers to Discharge: No Barriers Identified   Patient Goals and CMS Choice Patient states their goals for this hospitalization and ongoing recovery are:: to get  back to work next week and get on with her life CMS Medicare.gov Compare Post Acute Care list provided to:: Patient Choice offered to / list presented to : Patient  Discharge Placement                       Discharge Plan and Services                DME Arranged: (NA)         HH Arranged: RN, Refused HH          Social Determinants of Health (SDOH) Interventions     Readmission Risk Interventions No flowsheet data found.

## 2019-05-22 NOTE — Discharge Instructions (Signed)
Cellulitis, Adult ° °Cellulitis is a skin infection. The infected area is often warm, red, swollen, and sore. It occurs most often in the arms and lower legs. It is very important to get treated for this condition. °What are the causes? °This condition is caused by bacteria. The bacteria enter through a break in the skin, such as a cut, burn, insect bite, open sore, or crack. °What increases the risk? °This condition is more likely to occur in people who: °· Have a weak body defense system (immune system). °· Have open cuts, burns, bites, or scrapes on the skin. °· Are older than 69 years of age. °· Have a blood sugar problem (diabetes). °· Have a long-lasting (chronic) liver disease (cirrhosis) or kidney disease. °· Are very overweight (obese). °· Have a skin problem, such as: °? Itchy rash (eczema). °? Slow movement of blood in the veins (venous stasis). °? Fluid buildup below the skin (edema). °· Have been treated with high-energy rays (radiation). °· Use IV drugs. °What are the signs or symptoms? °Symptoms of this condition include: °· Skin that is: °? Red. °? Streaking. °? Spotting. °? Swollen. °? Sore or painful when you touch it. °? Warm. °· A fever. °· Chills. °· Blisters. °How is this diagnosed? °This condition is diagnosed based on: °· Medical history. °· Physical exam. °· Blood tests. °· Imaging tests. °How is this treated? °Treatment for this condition may include: °· Medicines to treat infections or allergies. °· Home care, such as: °? Rest. °? Placing cold or warm cloths (compresses) on the skin. °· Hospital care, if the condition is very bad. °Follow these instructions at home: °Medicines °· Take over-the-counter and prescription medicines only as told by your doctor. °· If you were prescribed an antibiotic medicine, take it as told by your doctor. Do not stop taking it even if you start to feel better. °General instructions ° °· Drink enough fluid to keep your pee (urine) pale yellow. °· Do not touch  or rub the infected area. °· Raise (elevate) the infected area above the level of your heart while you are sitting or lying down. °· Place cold or warm cloths on the area as told by your doctor. °· Keep all follow-up visits as told by your doctor. This is important. °Contact a doctor if: °· You have a fever. °· You do not start to get better after 1-2 days of treatment. °· Your bone or joint under the infected area starts to hurt after the skin has healed. °· Your infection comes back. This can happen in the same area or another area. °· You have a swollen bump in the area. °· You have new symptoms. °· You feel ill and have muscle aches and pains. °Get help right away if: °· Your symptoms get worse. °· You feel very sleepy. °· You throw up (vomit) or have watery poop (diarrhea) for a long time. °· You see red streaks coming from the area. °· Your red area gets larger. °· Your red area turns dark in color. °These symptoms may represent a serious problem that is an emergency. Do not wait to see if the symptoms will go away. Get medical help right away. Call your local emergency services (911 in the [place] Do not drive yourself to the hospital. °Summary °· Cellulitis is a skin infection. The area is often warm, red, swollen, and sore. °· This condition is treated with medicines, rest, and cold and warm cloths. °· Take all medicines only   as told by your doctor.  Tell your doctor if symptoms do not start to get better after 1-2 days of treatment. This information is not intended to replace advice given to you by your health care provider. Make sure you discuss any questions you have with your health care provider. Document Released: 04/04/2008 Document Revised: 03/08/2018 Document Reviewed: 03/08/2018 Elsevier Patient Education  2020 [place] on my medicine - XARELTO (Rivaroxaban)  Why was Xarelto prescribed for you? Xarelto was prescribed for you to reduce the risk of a blood clot forming  that can cause a stroke if you have a medical condition called atrial fibrillation (a type of irregular heartbeat).  What do you need to know about xarelto ? Take your Xarelto ONCE DAILY at the same time every day with your evening meal. If you have difficulty swallowing the tablet whole, you may crush it and mix in applesauce just prior to taking your dose.  Take Xarelto exactly as prescribed by your doctor and DO NOT stop taking Xarelto without talking to the doctor who prescribed the medication.  Stopping without other stroke prevention medication to take the place of Xarelto may increase your risk of developing a clot that causes a stroke.  Refill your prescription before you run out.  After discharge, you should have regular check-up appointments with your healthcare provider that is prescribing your Xarelto.  In the future your dose may need to be changed if your kidney function or weight changes by a significant amount.  What do you do if you miss a dose? If you are taking Xarelto ONCE DAILY and you miss a dose, take it as soon as you remember on the same day then continue your regularly scheduled once daily regimen the next day. Do not take two doses of Xarelto at the same time or on the same day.   Important Safety Information A possible side effect of Xarelto is bleeding. You should call your healthcare provider right away if you experience any of the following: ? Bleeding from an injury or your nose that does not stop. ? Unusual colored urine (red or dark brown) or unusual colored stools (red or black). ? Unusual bruising for unknown reasons. ? A serious fall or if you hit your head (even if there is no bleeding).  Some medicines may interact with Xarelto and might increase your risk of bleeding while on Xarelto. To help avoid this, consult your healthcare provider or pharmacist prior to using any new prescription or non-prescription medications, including herbals, vitamins,  non-steroidal anti-inflammatory drugs (NSAIDs) and supplements.  This website has more information on Xarelto: [email or web address]

## 2019-05-22 NOTE — Discharge Summary (Signed)
Discharge Summary  Erin Holt:[id number] DOB: Apr 25, 1951  PCP: Erin Cha, MD  Admit date: 05/08/2019 Discharge date: 05/22/2019  Time spent: 35 minutes  Recommendations for Outpatient Follow-up:  1. Follow-up with orthopedic surgery 2. Follow-up with your primary care provider 3. Take your medications as prescribed  Discharge Diagnoses:  Active Hospital Problems   Diagnosis Date Noted   Left arm cellulitis 05/14/2019   GI bleed 05/08/2019   Acute blood loss anemia 05/08/2019   Hemorrhagic shock (HCC) 05/08/2019   Respiratory insufficiency    Liver cirrhosis (Erin Holt) 11/29/2018   AKI (acute kidney injury) (Erin Holt) 11/29/2018   Permanent atrial fibrillation 08/16/2018    Resolved Hospital Problems  No resolved problems to display.    Discharge Condition: Stable  Diet recommendation: Resume previous diet  Vitals:   05/22/19 0514 05/22/19 1012  BP: 127/86 (!) 141/98  Pulse: 91 (!) 101  Resp:    Temp: 98.6 F (37 C)   SpO2: 100% 100%    History of present illness:   Patient is a 68 year old female with past medical history significant for Nash cirrhosis, hypertension, atrial fibrillation on Xarelto and prior CVA presented to the hospital withblack stool and dark red blood per rectum for 5 days or soprior to presentation. Patient was intubated and admitted to ICU team. Patient underwent EGD and colonoscopy without any clear source of bleeding on 05/12/19. EGD  revealed gastric erosions without any evidence of recent bleeding. Hemoglobin has remained stable. Patient has been extubated, and transferred to the hospitalist service.  Left forearm is swollen, firm to touch with some cellulitic changes and blisters.  CTof the left upper extremity did not show any evidence of localized abscess.  Ultrasound of the left upper extremity did not show any evidence of DVT.  Patient persisted to have tense swelling of the forearm with mild leukocytosis and  low-grade fever so orthopedics was consulted on 05/14/2019.  MRI of the arm was performed which did not show any evidence of abscess.  Patient was then seen by orthopedics who continued conservative treatment and was on IV antibiotics.     ID recommends Augmentin on discharge.  05/22/19: Patient was seen and examined her bedside.  She states her left upper extremity cellulitis is much improved.  She has no new complaints.  She wants to go home.  On the day of discharge, the patient was hemodynamically stable.  She will need to follow-up with orthopedic surgery and her primary care provider posthospitalization.    Hospital Course:  Principal Problem:   Left arm cellulitis Active Problems:   Permanent atrial fibrillation   AKI (acute kidney injury) (Erin Holt)   Liver cirrhosis (HCC)   GI bleed   Acute blood loss anemia   Hemorrhagic shock (HCC)   Respiratory insufficiency  Left forearm cellulitis: Improving with conservative treatment.    Completed IV antibiotics with Zosyn. Orthopedic closely followed the patient.  CT scan of the forearm  showed subcutaneous edema.  Ultrasound of the left upper extremity did not show any evidence of DVT. MRI of the forearm showed no evidence of abscess.  Continue arm elevation.  As per orthopedics, changed to oral antibiotic. ID recommends Augmentin.  MRSA screening negative on 05/20/2019.  Some excoriation noted on exam on left upper extremity at the site of cellulitis.  Home with home health services, wound care RN.    GI bleed: status post EGD and colonoscopy. Source of bleeding remains unclear. Patient has history of Karlene Lineman cirrhosis, and was on  Xarelto for atrial fibrillation.  CBC from today is pending. Patient is currently stable.    Last hemoglobin from 7/19 hemoglobin of 8.2.  On Lasix and spironolactone.  Patient will have to follow-up with Dr. Therisa Doyne, GI as outpatient after discharge.  Continue oral Protonix.  Hemoglobin 8.2 on 05/21/19.    Chronic kidney  disease stage III versus acute kidney injury on chronic kidney disease stage III: Creatinine levels remained stable.  Patient is on low-dose Lasix.  Follow-up with your PCP.  Permanent atrial fibrillation: Continue Cardizem.  On Xarelto.    Hypertension: Blood pressure is stable.    Resume home antihypertensives.  Follow-up with your primary care provider  VTE Prophylaxis:  Xarelto  Code Status: Full code    Consultants:  GI, critical care  Orthopedics  05/14/2019  Procedures:  Intubation, mechanical ventilation, extubation.  EGD, colonoscopy on 05/09/2019.  Antibiotics:  Vancomycin and zosyn > 05/14/19  Vancomycin DC'd>05/19/2019  Po Augmentin 05/21/19>> x7 days.    Discharge Exam: BP (!) 141/98 (BP Location: Right Arm)    Pulse (!) 101    Temp 98.6 F (37 C) (Oral)    Resp 18    Ht 5\' 4"  (1.626 m)    Wt 106.8 kg    SpO2 100%    BMI 40.42 kg/m   General: 68 y.o. year-old female well developed well nourished in no acute distress.  Alert and oriented x3.  Cardiovascular: Regular rate and rhythm with no rubs or gallops.  No thyromegaly or JVD noted.    Respiratory: Clear to auscultation with no wheezes or rales. Good inspiratory effort.  Abdomen: Soft nontender nondistended with normal bowel sounds x4 quadrants.  Musculoskeletal: Left upper extremity mild edema with some excoriation.  2/4 pulses in all 4 extremities.  Psychiatry: Mood is appropriate for condition and setting  Discharge Instructions You were cared for by a hospitalist during your hospital stay. If you have any questions about your discharge medications or the care you received while you were in the hospital after you are discharged, you can call the unit and asked to speak with the hospitalist on call if the hospitalist that took care of you is not available. Once you are discharged, your primary care physician will handle any further medical issues. Please note that NO REFILLS for any  discharge medications will be authorized once you are discharged, as it is imperative that you return to your primary care physician (or establish a relationship with a primary care physician if you do not have one) for your aftercare needs so that they can reassess your need for medications and monitor your lab values.  Discharge Instructions    AMB Referral to [facility]   Complete by: As directed    Please assign to community nurse for complex care and disease management follow up calls and assess for further needs.  Patient's primary does the TOC follow up: Erin Cha, MD, [facility]. Questions please call:   Natividad Brood, RN BSN [facility] Liaison  [phone] business mobile phone Toll free office [phone]  Fax number: [phone] [email or web address]  [email or web address]   Reason for consult: Post hospital follow up new diagnosis   Diagnoses of: Other   Other Diagnosis: GIB; Atrial Fib   Expected date of contact: 1-3 days (reserved for hospital discharges)     Allergies as of 05/22/2019   No Known Allergies     Medication List    TAKE these medications   allopurinol 100 MG  tablet Commonly known as: ZYLOPRIM Take 100 mg by mouth every evening.   amoxicillin-clavulanate 875-125 MG tablet Commonly known as: AUGMENTIN Take 1 tablet by mouth every 12 (twelve) hours for 7 days.   atorvastatin 40 MG tablet Commonly known as: LIPITOR Take 40 mg by mouth every evening.   Cartia XT 240 MG 24 hr capsule Generic drug: diltiazem Take 240 mg by mouth daily.   furosemide 20 MG tablet Commonly known as: LASIX Take 1 tablet (20 mg total) by mouth daily. What changed: how much to take   lactulose 10 GM/15ML solution Commonly known as: CHRONULAC Take 10-50 g by mouth See admin instructions. Take 10-50 grams (15-75 ml's) daily AS DIRECTED   latanoprost 0.005 % ophthalmic solution Commonly known as:  XALATAN Place 1 drop into both eyes at bedtime.   pantoprazole 40 MG tablet Commonly known as: PROTONIX Take 1 tablet (40 mg total) by mouth daily. Start taking on: May 23, 2019   rivaroxaban 20 MG Tabs tablet Commonly known as: Xarelto Take 1 tablet (20 mg total) by mouth daily with supper.   Simbrinza 1-0.2 % Susp Generic drug: Brinzolamide-Brimonidine Place 1 drop into both eyes 2 (two) times daily.   spironolactone 50 MG tablet Commonly known as: ALDACTONE Take 1 tablet (50 mg total) by mouth daily. Start taking on: May 23, 2019      No Known Allergies Follow-up Information    Roseanne Kaufman, MD Follow up in 6 day(s).   Specialty: Orthopedic Surgery Why: Please see Dr. Amedeo Plenty again's office this coming Monday May 27, 2019 at 1 PM Contact information: [address] [place] [address] [phone]        Erin Cha, MD. Call in 1 day(s).   Specialty: Internal Medicine Why: Please call for a post hospital follow-up appointment. Contact information: [address] [place] [address] [phone]        Minus Breeding, MD .   Specialty: Cardiology Contact information: [address] [place] [address] [phone]            The results of significant diagnostics from this hospitalization (including imaging, microbiology, ancillary and laboratory) are listed below for reference.    Significant Diagnostic Studies: Ct Abdomen Pelvis Wo Contrast  Result Date: 05/09/2019 CLINICAL DATA:  GI bleed. EXAM: CT ABDOMEN AND PELVIS WITHOUT CONTRAST TECHNIQUE: Multidetector CT imaging of the abdomen and pelvis was performed following the standard protocol without IV contrast. COMPARISON:  Liver CT 10/17/2018 FINDINGS: Lower chest: Left pleural effusion is partially included. Bilateral lower lobe atelectasis. Cardiomegaly is partially included. Hepatobiliary: Nodular hepatic contours suggesting  cirrhosis. Arms down positioning leads to mild streak artifact limiting assessment. No evidence for focal lesion. Suggestion of high-density material in the gallbladder, possible sludge. Possible gallbladder wall thickening. Pancreas: Not well-defined with parenchymal atrophy. No ductal dilatation or inflammation. Spleen: Spleen is normal in size, partially obscured by streak artifact from arms down positioning. Adrenals/Urinary Tract: No adrenal nodule. No hydronephrosis. Mild bilateral perinephric edema. Simple cyst in the posterior right kidney on prior exam is not visualized. Small exophytic cyst in the lower right kidney is unchanged. Stomach/Bowel: Enteric tube tip and side-port in the stomach. Stomach is nondistended. No small bowel dilatation or inflammation. No obstruction. Appendix not confidently visualized. Normal terminal ileum. Majority of the colon is decompressed. No obvious colonic wall thickening or inflammatory change. No findings to localize source of GI bleed. Vascular/Lymphatic: Aortic atherosclerosis. No aneurysm. Limited assessment for adenopathy  given lack contrast and presence of ascites. Reproductive: Uterine calcifications may be fibroids. No obvious adnexal mass. Other: Moderate volume free fluid in the abdomen and pelvis, improved fluid volume from prior exam. Fluid appears slightly heterogeneous and greater density than seen with simple ascites, Hounsfield units 25-33 no free air. No loculated fluid collection. Musculoskeletal: There are no acute or suspicious osseous abnormalities. IMPRESSION: 1. No findings to localize source of GI bleed. 2. Moderate volume ascites in the abdomen and pelvis, diminished fluid volume from December 2019 CT. Fluid appears slightly heterogeneous and greater density than seen with simple ascites. Findings may reflect complex ascites/peritonitis, however an element of hemorrhage is also considered without identifiable source. 3. Cirrhotic hepatic  morphology. Possible sludge in the gallbladder and gallbladder wall thickening. 4. Left pleural effusion is partially included. Bilateral lower lobe atelectasis. Aortic Atherosclerosis (ICD10-I70.0). These results will be called to the ordering clinician or representative by the Radiologist Assistant, and communication documented in the PACS or zVision Dashboard. Electronically Signed   By: Keith Rake M.D.   On: 05/09/2019 02:48   Ct Forearm Left Wo Contrast  Result Date: 05/12/2019 CLINICAL DATA:  Patient admitted with gastrointestinal bleeding. Left forearm pain. Question compartment syndrome. EXAM: CT OF THE LEFT FOREARM WITHOUT CONTRAST TECHNIQUE: Multidetector CT imaging was performed according to the standard protocol. Multiplanar CT image reconstructions were also generated. COMPARISON:  None. FINDINGS: Bones/Joint/Cartilage No acute or focal bony abnormality is identified. No joint effusion. Ligaments Suboptimally assessed by CT. Muscles and Tendons Appear intact. No mass or intramuscular fluid collection is identified. No gas within muscle or tracking along fascial planes is seen. Soft tissues Scattered subcutaneous edema is present. No focal fluid collection. Multiple skin blisters are identified. The largest is along the distal radius and measures 2 cm AP by 0.9 cm transverse by 2.1 cm long. IMPRESSION: CT scan is unable to diagnose compartment syndrome. If there is concern for compartment syndrome, pressure measurements are recommended. Subcutaneous edema about the forearm could be due to dependent change or cellulitis. Scattered blisters on skin. No acute bony or joint abnormality. Electronically Signed   By: Inge Rise M.D.   On: 05/12/2019 14:57   Mr Forearm Left Wo Contrast  Result Date: 05/19/2019 CLINICAL DATA:  Forearm pain and fever. EXAM: MRI OF THE LEFT FOREARM WITHOUT CONTRAST TECHNIQUE: Multiplanar, multisequence MR imaging of the left forearm was performed. No intravenous  contrast was administered. COMPARISON:  CT scan May 12, 2019 FINDINGS: Bones/Joint/Cartilage No significant abnormal osseous edema signal to suggest osteomyelitis. Upper normal amount of fluid in the elbow joint. Ligaments N/A Muscles and Tendons Diffuse muscular and fascial edema in the anterior, posterior, and lateral compartments of the forearm without appreciable drainable abscess. Soft tissues Diffuse and worsened subcutaneous edema diffusely in the forearm, also tracking into the upper arm. No drainable abscess or appreciable gas in the soft tissues. Along the radial side of the distal forearm, several fluid signal intensities along the cutaneous surface are compatible with blisters. IMPRESSION: 1. Worsening diffuse subcutaneous edema in the forearm, accompanied by infiltrative edema in the musculature and fascial planes of all 3 compartments of the forearm. Appearance favors cellulitis with myositis/fasciitis. No gas is evident in the soft tissues. No abscess is identified. No findings of osteomyelitis at this time. 2. Blisters along the radial side of the distal forearm. Electronically Signed   By: Van Clines M.D.   On: 05/19/2019 09:37   Dg Chest Port 1 View  Result Date: 05/09/2019 CLINICAL  DATA:  Central line placement EXAM: PORTABLE CHEST 1 VIEW COMPARISON:  May 08, 2019 and 10:39 p.m. FINDINGS: The endotracheal tube terminates just above the carina by approximately 1.4 cm. There is a newly placed left IJ central venous catheter with tip projecting over the SVC. The heart remains enlarged. There is some vascular congestion. There is no pneumothorax. There is an OG tube that extends below the left hemidiaphragm. IMPRESSION: 1. Well-positioned left-sided central venous catheter without evidence of a pneumothorax. 2. Endotracheal tube terminates 1.4 cm above the carina. 3. Enteric tube extends below the left hemidiaphragm. 4. Otherwise, stable appearance of the chest. Electronically Signed   By:  Constance Holster M.D.   On: 05/09/2019 00:33   Dg Chest Port 1 View  Result Date: 05/08/2019 CLINICAL DATA:  Intubation. EXAM: PORTABLE CHEST 1 VIEW COMPARISON:  Radiograph earlier this day. FINDINGS: Endotracheal tube tip 2.9 cm from the carina. Cardiomegaly is unchanged. No pulmonary edema or acute airspace disease. No pneumothorax or large pleural effusion. IMPRESSION: 1. Endotracheal tube tip 2.9 cm from the carina. 2. Stable cardiomegaly. Electronically Signed   By: Keith Rake M.D.   On: 05/08/2019 23:08   Dg Chest Port 1 View  Result Date: 05/08/2019 CLINICAL DATA:  Shortness of breath EXAM: PORTABLE CHEST 1 VIEW COMPARISON:  12/06/2013 FINDINGS: Cardiac shadow is enlarged but stable. Lungs are well aerated bilaterally. No focal infiltrate or effusion is seen. No acute bony abnormality is noted. IMPRESSION: Stable cardiomegaly.  No acute abnormality noted. Electronically Signed   By: Inez Catalina M.D.   On: 05/08/2019 22:12   Vas [place] Upper Extremity Arterial Duplex  Result Date: 05/11/2019 UPPER EXTREMITY DUPLEX STUDY Indications: Pain.  Risk Factors:  Hypertension. Other Factors: persistent A-Fib, History of stroke, carotid artery occlusion,                recent arterial line. earlier IV infiltrate Limitations: Severe edema Comparison Study: No previous exam available for comparison Performing Technologist: Toma Copier RVS  Examination Guidelines: A complete evaluation includes B-mode imaging, spectral Doppler, color Doppler, and power Doppler as needed of all accessible portions of each vessel. Bilateral testing is considered an integral part of a complete examination. Limited examinations for reoccurring indications may be performed as noted.  Right Doppler Findings: +----------+----------+--------+-----------+--------+  Site       PSV (cm/s) Waveform Plaque      Comments  +----------+----------+--------+-----------+--------+  Subclavian 80         biphasic no stenosis            +----------+----------+--------+-----------+--------+  Axillary                                             +----------+----------+--------+-----------+--------+  Brachial                                             +----------+----------+--------+-----------+--------+  Radial                                               +----------+----------+--------+-----------+--------+ Left Doppler Findings: +----------+----------+--------+-----------+--------+  Site       PSV (cm/s) Waveform Plaque  Comments  +----------+----------+--------+-----------+--------+  Subclavian 60         biphasic no stenosis           +----------+----------+--------+-----------+--------+  Axillary   79         biphasic no stenosis           +----------+----------+--------+-----------+--------+  Brachial   66         biphasic no stenosis           +----------+----------+--------+-----------+--------+  Radial     49         biphasic no stenosis           +----------+----------+--------+-----------+--------+  Ulnar      49         biphasic no stenosis           +----------+----------+--------+-----------+--------+ Technically difficult due to extreme swelling. Biphasic Doppler waveforms may be secondary to edema and previous right sided stroke.  Summary:  Right: There is no evidence of a right subclavian obstruction. Left: No obstruction visualized in the left upper extremity. *See table(s) above for measurements and observations. Electronically signed by Ruta Hinds MD on 05/11/2019 at 10:31:37 AM.    Final    Vas [place] Upper Extremity Venous Duplex  Result Date: 05/11/2019 UPPER VENOUS STUDY  Indications: Pain, Edema, and Status post IV infiltrate Risk Factors: Persistent A-Fib on Xarelto, HTN,h/o/stroke. Comparison Study: No previous exam available for comparison. Performing Technologist: Toma Copier RVS  Examination Guidelines: A complete evaluation includes B-mode imaging, spectral Doppler, color Doppler, and power Doppler as  needed of all accessible portions of each vessel. Bilateral testing is considered an integral part of a complete examination. Limited examinations for reoccurring indications may be performed as noted.  Right Findings: +----------+------------+---------+-----------+----------+-------+  RIGHT      Compressible Phasicity Spontaneous Properties Summary  +----------+------------+---------+-----------+----------+-------+  Subclavian     Full        Yes        Yes                         +----------+------------+---------+-----------+----------+-------+  Left Findings: +----------+------------+---------+-----------+----------+---------------------+  LEFT       Compressible Phasicity Spontaneous Properties        Summary         +----------+------------+---------+-----------+----------+---------------------+  IJV                                                       Unable to visualize                                                                due to IV line                                                                      placement        +----------+------------+---------+-----------+----------+---------------------+  Subclavian     Full        Yes        Yes                                       +----------+------------+---------+-----------+----------+---------------------+  Axillary       Full        Yes        Yes                                       +----------+------------+---------+-----------+----------+---------------------+  Brachial       Full        Yes        Yes                                       +----------+------------+---------+-----------+----------+---------------------+  Radial         Full                                                             +----------+------------+---------+-----------+----------+---------------------+  Ulnar          Full                                                             +----------+------------+---------+-----------+----------+---------------------+   Cephalic       None                                        Non compressible                                                                wrist to mid upper                                                                       arm           +----------+------------+---------+-----------+----------+---------------------+  Basilic                                                   Unable to visualize  well enough to                                                                   evaluate due to                                                                     swelling         +----------+------------+---------+-----------+----------+---------------------+ Thrombus noted in the cepahalic vein wrist to mid upper at whoch point hthe vein becomes thrombus free and compressible. Unable to visualize the basilic due to swelling except for an area of the distal upper arm. The vein was non compressible but was probabblt due to the tighness of the arm.  Summary:  Right: No evidence of thrombosis in the subclavian.  Left: No evidence of deep vein thrombosis in the upper extremity. No evidence of thrombosis in the subclavian. Findings consistent with acute superficial vein thrombosis involving the left cephalic vein. See technician comments listed above.  *See table(s) above for measurements and observations.  Diagnosing physician: Ruta Hinds MD Electronically signed by Ruta Hinds MD on 05/11/2019 at 10:32:13 AM.    Final     Microbiology: No results found for this or any previous visit (from the past 240 hour(s)).   Labs: Basic Metabolic Panel: Recent Labs  Lab 05/16/19 0708 05/18/19 0740 05/19/19 0719  NA 140 139 140  K 3.4* 3.7 3.6  CL 107 109 110  CO2 22 21* 20*  GLUCOSE 117* 111* 114*  BUN 22 19 20   CREATININE 1.19* 1.07* 1.10*  CALCIUM 8.5* 8.4* 8.5*  MG  --   --  1.8   Liver Function Tests: No results for input(s): AST, ALT,  ALKPHOS, BILITOT, PROT, ALBUMIN in the last 168 hours. No results for input(s): LIPASE, AMYLASE in the last 168 hours. No results for input(s): AMMONIA in the last 168 hours. CBC: Recent Labs  Lab 05/16/19 0708 05/18/19 0740 05/19/19 0719 05/21/19 1052  WBC 10.5 11.9* 11.6* 9.1  HGB 8.4* 7.5* 8.2* 8.2*  HCT 27.6* 24.7* 27.3* 27.6*  MCV 89.0 86.7 86.7 86.8  PLT 219 199 226 333   Cardiac Enzymes: No results for input(s): CKTOTAL, CKMB, CKMBINDEX, TROPONINI in the last 168 hours. BNP: BNP (last 3 results) No results for input(s): BNP in the last 8760 hours.  ProBNP (last 3 results) No results for input(s): PROBNP in the last 8760 hours.  CBG: Recent Labs  Lab 05/16/19 1603  GLUCAP 117*       Signed:  Kayleen Memos, MD [facility] 05/22/2019, 1:40 PM

## 2019-05-23 NOTE — Telephone Encounter (Signed)
27f 84.6kg 1.10 05/19/19 ccr 55mlmin Lovw/hochrein 01/31/19 telemed

## 2019-05-24 ENCOUNTER — Other Ambulatory Visit: Payer: Self-pay | Admitting: *Deleted

## 2019-05-24 NOTE — Patient Outreach (Signed)
[facility]) Care Management  05/24/2019  Erin Holt 04/06/1951 [id number]    Referral received 05/13/2019 Pt discharged 05/22/2019 Provider office to completed transition of care.  DECLINED [facility]  RN spoke with pt today and explained the [facility] services and purpose for today's call. Pt receptive and had time to speak. RN inquired on her recent hospital discharged and follow up appointments. Pt able to recite all her upcoming appointments with GI/Ortho/CAD and her primary provider all next week. Pt verified she has sufficient transportation and a very support church family who checks on her constantly. RN inquired about her recent diagnosis with anemia. Pt states she has some ongoing weakness but was aware and getting better very slowly. Denies any falls and states she has a cane if needed by no need at this time. RN inquired and offered any needed assistance for referral however pt states she I recovery well with no needs and able to have good ROM with cooking and light house work. States her cellulitis will be addressed next week with an ortho provider appointment. RN offered case management services over the next few month for any supportive needs and/or resources however appreciative pt opt to decline indicating she is doing well. RN offered to update her provider and sent information concerning [facility] services (agreed). Will to notify pt's provider of her disposition with [facility] services and send information as discussed. No additional needs presented as this case will be closed.  Raina Mina, RN Care Management Coordinator [place] Office [phone]

## 2019-05-27 ENCOUNTER — Other Ambulatory Visit: Payer: Self-pay

## 2019-05-27 ENCOUNTER — Emergency Department (HOSPITAL_COMMUNITY): Payer: Medicare Other

## 2019-05-27 DIAGNOSIS — D500 Iron deficiency anemia secondary to blood loss (chronic): Secondary | ICD-10-CM | POA: Diagnosis not present

## 2019-05-27 DIAGNOSIS — M79602 Pain in left arm: Secondary | ICD-10-CM | POA: Diagnosis not present

## 2019-05-27 DIAGNOSIS — I219 Acute myocardial infarction, unspecified: Secondary | ICD-10-CM | POA: Diagnosis not present

## 2019-05-27 DIAGNOSIS — I10 Essential (primary) hypertension: Secondary | ICD-10-CM | POA: Diagnosis not present

## 2019-05-27 DIAGNOSIS — Z978 Presence of other specified devices: Secondary | ICD-10-CM

## 2019-05-27 DIAGNOSIS — J9811 Atelectasis: Secondary | ICD-10-CM | POA: Diagnosis not present

## 2019-05-27 DIAGNOSIS — I272 Pulmonary hypertension, unspecified: Secondary | ICD-10-CM | POA: Diagnosis present

## 2019-05-27 DIAGNOSIS — I4891 Unspecified atrial fibrillation: Secondary | ICD-10-CM | POA: Diagnosis present

## 2019-05-27 DIAGNOSIS — I2510 Atherosclerotic heart disease of native coronary artery without angina pectoris: Secondary | ICD-10-CM | POA: Diagnosis present

## 2019-05-27 DIAGNOSIS — E874 Mixed disorder of acid-base balance: Secondary | ICD-10-CM | POA: Diagnosis present

## 2019-05-27 DIAGNOSIS — R0603 Acute respiratory distress: Secondary | ICD-10-CM

## 2019-05-27 DIAGNOSIS — I50813 Acute on chronic right heart failure: Secondary | ICD-10-CM | POA: Diagnosis not present

## 2019-05-27 DIAGNOSIS — Z20828 Contact with and (suspected) exposure to other viral communicable diseases: Secondary | ICD-10-CM | POA: Diagnosis present

## 2019-05-27 DIAGNOSIS — J9601 Acute respiratory failure with hypoxia: Secondary | ICD-10-CM | POA: Diagnosis not present

## 2019-05-27 DIAGNOSIS — Z7901 Long term (current) use of anticoagulants: Secondary | ICD-10-CM

## 2019-05-27 DIAGNOSIS — G4733 Obstructive sleep apnea (adult) (pediatric): Secondary | ICD-10-CM | POA: Diagnosis present

## 2019-05-27 DIAGNOSIS — E872 Acidosis, unspecified: Secondary | ICD-10-CM | POA: Diagnosis present

## 2019-05-27 DIAGNOSIS — N183 Chronic kidney disease, stage 3 (moderate): Secondary | ICD-10-CM | POA: Diagnosis present

## 2019-05-27 DIAGNOSIS — R188 Other ascites: Secondary | ICD-10-CM | POA: Diagnosis not present

## 2019-05-27 DIAGNOSIS — D62 Acute posthemorrhagic anemia: Secondary | ICD-10-CM | POA: Diagnosis present

## 2019-05-27 DIAGNOSIS — I4821 Permanent atrial fibrillation: Secondary | ICD-10-CM | POA: Diagnosis present

## 2019-05-27 DIAGNOSIS — I509 Heart failure, unspecified: Secondary | ICD-10-CM | POA: Diagnosis not present

## 2019-05-27 DIAGNOSIS — R570 Cardiogenic shock: Secondary | ICD-10-CM | POA: Diagnosis present

## 2019-05-27 DIAGNOSIS — K7581 Nonalcoholic steatohepatitis (NASH): Secondary | ICD-10-CM | POA: Diagnosis present

## 2019-05-27 DIAGNOSIS — N939 Abnormal uterine and vaginal bleeding, unspecified: Secondary | ICD-10-CM | POA: Diagnosis not present

## 2019-05-27 DIAGNOSIS — N179 Acute kidney failure, unspecified: Secondary | ICD-10-CM | POA: Diagnosis not present

## 2019-05-27 DIAGNOSIS — E669 Obesity, unspecified: Secondary | ICD-10-CM | POA: Diagnosis present

## 2019-05-27 DIAGNOSIS — D649 Anemia, unspecified: Secondary | ICD-10-CM | POA: Diagnosis not present

## 2019-05-27 DIAGNOSIS — I5043 Acute on chronic combined systolic (congestive) and diastolic (congestive) heart failure: Secondary | ICD-10-CM | POA: Diagnosis present

## 2019-05-27 DIAGNOSIS — I130 Hypertensive heart and chronic kidney disease with heart failure and stage 1 through stage 4 chronic kidney disease, or unspecified chronic kidney disease: Principal | ICD-10-CM | POA: Diagnosis present

## 2019-05-27 DIAGNOSIS — I5082 Biventricular heart failure: Secondary | ICD-10-CM | POA: Diagnosis present

## 2019-05-27 DIAGNOSIS — H409 Unspecified glaucoma: Secondary | ICD-10-CM | POA: Diagnosis present

## 2019-05-27 DIAGNOSIS — K7460 Unspecified cirrhosis of liver: Secondary | ICD-10-CM | POA: Diagnosis not present

## 2019-05-27 DIAGNOSIS — Z8639 Personal history of other endocrine, nutritional and metabolic disease: Secondary | ICD-10-CM | POA: Diagnosis not present

## 2019-05-27 DIAGNOSIS — I110 Hypertensive heart disease with heart failure: Secondary | ICD-10-CM | POA: Diagnosis not present

## 2019-05-27 DIAGNOSIS — L03114 Cellulitis of left upper limb: Secondary | ICD-10-CM | POA: Diagnosis present

## 2019-05-27 DIAGNOSIS — Z79899 Other long term (current) drug therapy: Secondary | ICD-10-CM

## 2019-05-27 DIAGNOSIS — K922 Gastrointestinal hemorrhage, unspecified: Secondary | ICD-10-CM | POA: Diagnosis not present

## 2019-05-27 DIAGNOSIS — Z95828 Presence of other vascular implants and grafts: Secondary | ICD-10-CM

## 2019-05-27 DIAGNOSIS — R0600 Dyspnea, unspecified: Secondary | ICD-10-CM

## 2019-05-27 DIAGNOSIS — Z9114 Patient's other noncompliance with medication regimen: Secondary | ICD-10-CM

## 2019-05-27 DIAGNOSIS — Z515 Encounter for palliative care: Secondary | ICD-10-CM | POA: Diagnosis not present

## 2019-05-27 DIAGNOSIS — G4730 Sleep apnea, unspecified: Secondary | ICD-10-CM | POA: Diagnosis present

## 2019-05-27 DIAGNOSIS — Z6841 Body Mass Index (BMI) 40.0 and over, adult: Secondary | ICD-10-CM

## 2019-05-27 DIAGNOSIS — Z8673 Personal history of transient ischemic attack (TIA), and cerebral infarction without residual deficits: Secondary | ICD-10-CM

## 2019-05-27 DIAGNOSIS — M109 Gout, unspecified: Secondary | ICD-10-CM | POA: Diagnosis present

## 2019-05-27 LAB — CBC WITH DIFFERENTIAL/PLATELET
Abs Immature Granulocytes: 0.05 10*3/uL (ref 0.00–0.07)
Basophils Absolute: 0 10*3/uL (ref 0.0–0.1)
Basophils Relative: 0 %
Eosinophils Absolute: 0 10*3/uL (ref 0.0–0.5)
Eosinophils Relative: 0 %
HCT: 27.8 % — ABNORMAL LOW (ref 36.0–46.0)
Hemoglobin: 7.9 g/dL — ABNORMAL LOW (ref 12.0–15.0)
Immature Granulocytes: 1 %
Lymphocytes Relative: 12 %
Lymphs Abs: 1.3 10*3/uL (ref 0.7–4.0)
MCH: 24.8 pg — ABNORMAL LOW (ref 26.0–34.0)
MCHC: 28.4 g/dL — ABNORMAL LOW (ref 30.0–36.0)
MCV: 87.1 fL (ref 80.0–100.0)
Monocytes Absolute: 0.6 10*3/uL (ref 0.1–1.0)
Monocytes Relative: 6 %
Neutro Abs: 8.7 10*3/uL — ABNORMAL HIGH (ref 1.7–7.7)
Neutrophils Relative %: 81 %
Platelets: 601 10*3/uL — ABNORMAL HIGH (ref 150–400)
RBC: 3.19 MIL/uL — ABNORMAL LOW (ref 3.87–5.11)
RDW: 17.4 % — ABNORMAL HIGH (ref 11.5–15.5)
WBC: 10.6 10*3/uL — ABNORMAL HIGH (ref 4.0–10.5)
nRBC: 1 % — ABNORMAL HIGH (ref 0.0–0.2)

## 2019-05-27 LAB — POCT I-STAT EG7
Acid-base deficit: 10 mmol/L — ABNORMAL HIGH (ref 0.0–2.0)
Acid-base deficit: 10 mmol/L — ABNORMAL HIGH (ref 0.0–2.0)
Bicarbonate: 12.5 mmol/L — ABNORMAL LOW (ref 20.0–28.0)
Bicarbonate: 14 mmol/L — ABNORMAL LOW (ref 20.0–28.0)
Calcium, Ion: 1.1 mmol/L — ABNORMAL LOW (ref 1.15–1.40)
Calcium, Ion: 1.16 mmol/L (ref 1.15–1.40)
HCT: 24 % — ABNORMAL LOW (ref 36.0–46.0)
HCT: 26 % — ABNORMAL LOW (ref 36.0–46.0)
Hemoglobin: 8.2 g/dL — ABNORMAL LOW (ref 12.0–15.0)
Hemoglobin: 8.8 g/dL — ABNORMAL LOW (ref 12.0–15.0)
O2 Saturation: 82 %
O2 Saturation: 46 %
Potassium: 3.9 mmol/L (ref 3.5–5.1)
Potassium: 3.8 mmol/L (ref 3.5–5.1)
Sodium: 145 mmol/L (ref 135–145)
Sodium: 143 mmol/L (ref 135–145)
TCO2: 13 mmol/L — ABNORMAL LOW (ref 22–32)
TCO2: 15 mmol/L — ABNORMAL LOW (ref 22–32)
pCO2, Ven: 18.9 mmHg — CL (ref 44.0–60.0)
pCO2, Ven: 24.4 mmHg — ABNORMAL LOW (ref 44.0–60.0)
pH, Ven: 7.427 (ref 7.250–7.430)
pH, Ven: 7.366 (ref 7.250–7.430)
pO2, Ven: 44 mmHg (ref 32.0–45.0)
pO2, Ven: 25 mmHg — CL (ref 32.0–45.0)

## 2019-05-27 LAB — BRAIN NATRIURETIC PEPTIDE: B Natriuretic Peptide: 1641.2 pg/mL — ABNORMAL HIGH (ref 0.0–100.0)

## 2019-05-27 LAB — BASIC METABOLIC PANEL
Anion gap: 17 — ABNORMAL HIGH (ref 5–15)
BUN: 26 mg/dL — ABNORMAL HIGH (ref 8–23)
CO2: 13 mmol/L — ABNORMAL LOW (ref 22–32)
Calcium: 8.9 mg/dL (ref 8.9–10.3)
Chloride: 112 mmol/L — ABNORMAL HIGH (ref 98–111)
Creatinine, Ser: 1.83 mg/dL — ABNORMAL HIGH (ref 0.44–1.00)
GFR calc Af Amer: 32 mL/min — ABNORMAL LOW (ref >60)
GFR calc non Af Amer: 28 mL/min — ABNORMAL LOW (ref >60)
Glucose, Bld: 113 mg/dL — ABNORMAL HIGH (ref 70–99)
Potassium: 3.9 mmol/L (ref 3.5–5.1)
Sodium: 142 mmol/L (ref 135–145)

## 2019-05-27 LAB — HEPATIC FUNCTION PANEL
ALT: 29 U/L (ref 0–44)
AST: 34 U/L (ref 15–41)
Albumin: 3 g/dL — ABNORMAL LOW (ref 3.5–5.0)
Alkaline Phosphatase: 124 U/L (ref 38–126)
Bilirubin, Direct: 0.3 mg/dL — ABNORMAL HIGH (ref 0.0–0.2)
Indirect Bilirubin: 0.9 mg/dL (ref 0.3–0.9)
Total Bilirubin: 1.2 mg/dL (ref 0.3–1.2)
Total Protein: 7.4 g/dL (ref 6.5–8.1)

## 2019-05-27 LAB — LACTIC ACID, PLASMA: Lactic Acid, Venous: 5.9 mmol/L (ref 0.5–1.9)

## 2019-05-27 LAB — TYPE AND SCREEN
ABO/RH(D): O POS
Antibody Screen: NEGATIVE

## 2019-05-27 LAB — PROTIME-INR
INR: 3.5 — ABNORMAL HIGH (ref 0.8–1.2)
Prothrombin Time: 34.6 seconds — ABNORMAL HIGH (ref 11.4–15.2)

## 2019-05-27 LAB — SARS CORONAVIRUS 2 BY RT PCR (HOSPITAL ORDER, PERFORMED IN ~~LOC~~ HOSPITAL LAB): SARS Coronavirus 2: NEGATIVE

## 2019-05-27 MED ORDER — FUROSEMIDE 10 MG/ML IJ SOLN
40.0000 mg | Freq: Once | INTRAMUSCULAR | Status: AC
  Administered 2019-05-27: 40 mg via INTRAVENOUS
  Filled 2019-05-27: qty 4

## 2019-05-27 MED ORDER — DILTIAZEM HCL-DEXTROSE 100-5 MG/100ML-% IV SOLN (PREMIX): 5.0000 mg/h | INTRAVENOUS | Status: DC

## 2019-05-27 MED ORDER — DILTIAZEM HCL-DEXTROSE 100-5 MG/100ML-% IV SOLN (PREMIX)
5.0000 mg/h | INTRAVENOUS | Status: DC
  Administered 2019-05-27: 5 mg/h via INTRAVENOUS
  Administered 2019-05-28: 15 mg/h via INTRAVENOUS
  Administered 2019-05-28: 12.5 mg/h via INTRAVENOUS
  Filled 2019-05-27 (×4): qty 100

## 2019-05-27 MED ORDER — DILTIAZEM LOAD VIA INFUSION: 15.0000 mg | Freq: Once | INTRAVENOUS | Status: DC

## 2019-05-27 MED ORDER — DILTIAZEM LOAD VIA INFUSION
15.0000 mg | Freq: Once | INTRAVENOUS | Status: AC
  Administered 2019-05-27: 15 mg via INTRAVENOUS
  Filled 2019-05-27: qty 15

## 2019-05-27 NOTE — H&P (Signed)
Erin Holt:[id number] DOB: 06-13-1951 DOA: 05/26/2019     PCP: Leeroy Cha, MD   Outpatient Specialists:   CARDS:  Dr. Percival Spanish   GI  Dr. Therisa Doyne [place])     Patient arrived to ER on 05/26/2019 at 1713  Patient coming from: home Lives alone,     Chief Complaint:  Chief Complaint  Patient presents with  . Abnormal Lab    HPI: Erin Holt is a 68 y.o. female with medical history significant of a.fib on Xarelto, oSA on c.PAP, tricuspid regurgitation and pulmonary hypertension ascites, CKD, renal artery occlusion, stroke hypertension small gastric erosion    Presented with low hemoglobin counts as was instructed to present to emergency department by her primary care provider She reports week ago she developed vaginal bleeding feels like a regular menstrual period Although she has already been postmenopausal, she has been changing about 4 pads a day or so.  Patient states she is very sure it is vaginal in nature   No associated abdominal pain she is still on anticoagulation.  Recently her hemoglobin was checked and was down to 7.6 from 8.2 last week He endorses now vaginal bleeding and discussed this with her primary care provider who sent her to OB/GYN. After her hemoglobin dropping she was sent to emergency department  Patient endorsing some dyspnea will extremity swelling and abdominal swelling which is typical for her fluid overload.  States her last thoracentesis was in December  Beginning of this month patient had melena and hematochezia and was admitted to the hospital for hemorrhagic shock She had a prolonged hospital stay from 8th-22 th July Requiring admission to ICU requiring intubation she was seen by gastroenterology Dr. Therisa Doyne had a endoscopy and colonoscopy done showing small gastric erosion, colonoscopy showed diverticuli but no active bleeding. Hemoglobin at the time of discharge on 21 July was 8.2 she was discharged on oral Protonix   Complicated by left forearm cellulitis and localized abscess she was seen in hospital by orthopedics and ID and eventually was discharged home on Augmentin  She has not taken her home medications today.   Infectious risk factors:  Reports dyspnea In  ER RAPID COVID TEST  in house testing   Pending     Regarding pertinent Chronic problems:         HTN on spironolactone   CHF diastolic TR right sided - last echo Jan 2020  range of 55% to 60% PA pressure 40 mm Hg  On lasix  History of [place] cirrhosis requiring paracentesis in the past with fluid overload on Lasix and spironolactone       Morbid obesity-   BMI Readings from Last 1 Encounters:  05/22/19 40.42 kg/m       OSA -on  CPAP,     Hx of CVA -  With out residual deficits     A. Fib -  - CHA2DS2 vas score 6 :  current  on anticoagulation with   Xarelto,           -  Rate control:  Currently controlled with  Diltiazem        CKD stage III - baseline Cr  1.1   While in ER: Noted to be in A. fib with RVR required diltiazem bolus and followed by drip  The following Work up has been ordered so far:  Orders Placed This Encounter  Procedures  . SARS Coronavirus 2 (CEPHEID - Performed in [place] hospital lab), [facility]  .  DG Chest Portable 1 View  . CBC with Differential  . Basic metabolic panel  . Brain natriuretic peptide  . Hepatic function panel  . Protime-INR  . Lactic acid, plasma  . Sodium, urine, random  . Creatinine, urine, random  . Cardiac monitoring  . Consult to hospitalist  ALL PATIENTS BEING ADMITTED/HAVING PROCEDURES NEED COVID-19 SCREENING  . Consult to case management  . I-Stat venous blood gas, ED  . POCT I-Stat EG7  . ED EKG  . EKG 12-Lead  . Type and screen [place]  . Place in observation (patient's expected length of stay will be less than 2 midnights)     Following Medications were ordered in ER: Medications  diltiazem (CARDIZEM) 1 mg/mL load via  infusion 15 mg (15 mg Intravenous Bolus from Bag 05/20/2019 1937)    And  diltiazem (CARDIZEM) 100 mg in dextrose 5% 165m (1 mg/mL) infusion (7.5 mg/hr Intravenous Rate/Dose Change 05/16/2019 2125)  furosemide (LASIX) injection 40 mg (40 mg Intravenous Given 05/04/2019 2132)        Consult Orders  (From admission, onward)         Start     Ordered   05/25/2019 2230  Consult to case management  Once    Comments: Heart failure home health screen and may place order for PT/OT eval and treat if indicated.  Provider:  (Not yet assigned)  Question:  Reason for consult:  Answer:  Other (see comments)   05/05/2019 2233   05/21/2019 2113  Consult to hospitalist  ALL PATIENTS BEING ADMITTED/HAVING PROCEDURES NEED COVID-19 SCREENING Paged [facility], [place]  Once    Comments: ALL PATIENTS BEING ADMITTED/HAVING PROCEDURES NEED COVID-19 SCREENING  Provider:  (Not yet assigned)  Question Answer Comment  Place call to: [facility]   Reason for Consult Admit      05/30/2019 2112          Significant initial  Findings: Abnormal Labs Reviewed  CBC WITH DIFFERENTIAL/PLATELET - Abnormal; Notable for the following components:      Result Value   WBC 10.6 (*)    RBC 3.19 (*)    Hemoglobin 7.9 (*)    HCT 27.8 (*)    MCH 24.8 (*)    MCHC 28.4 (*)    RDW 17.4 (*)    Platelets 601 (*)    nRBC 1.0 (*)    Neutro Abs 8.7 (*)    All other components within normal limits  BASIC METABOLIC PANEL - Abnormal; Notable for the following components:   Chloride 112 (*)    CO2 13 (*)    Glucose, Bld 113 (*)    BUN 26 (*)    Creatinine, Ser 1.83 (*)    GFR calc non Af Amer 28 (*)    GFR calc Af Amer 32 (*)    Anion gap 17 (*)    All other components within normal limits  BRAIN NATRIURETIC PEPTIDE - Abnormal; Notable for the following components:   B Natriuretic Peptide 1,641.2 (*)    All other components within normal limits  HEPATIC FUNCTION PANEL - Abnormal; Notable for the following components:   Albumin 3.0  (*)    Bilirubin, Direct 0.3 (*)    All other components within normal limits  POCT I-STAT EG7 - Abnormal; Notable for the following components:   pCO2, Ven 18.9 (*)    Bicarbonate 12.5 (*)    TCO2 13 (*)    Acid-base deficit 10.0 (*)    Calcium,  Ion 1.10 (*)    HCT 24.0 (*)    Hemoglobin 8.2 (*)    All other components within normal limits    Otherwise labs showing:    Recent Labs  Lab 05/23/2019 1800 05/02/2019 1940  NA 142 145  K 3.9 3.9  CO2 13*  --   GLUCOSE 113*  --   BUN 26*  --   CREATININE 1.83*  --   CALCIUM 8.9  --     Cr    Up from baseline see below Lab Results  Component Value Date   CREATININE 1.83 (H) 05/20/2019   CREATININE 1.10 (H) 05/19/2019   CREATININE 1.07 (H) 05/18/2019    Recent Labs  Lab 05/26/2019 1930  AST 34  ALT 29  ALKPHOS 124  BILITOT 1.2  PROT 7.4  ALBUMIN 3.0*   Lab Results  Component Value Date   CALCIUM 8.9 05/11/2019   PHOS 2.1 (L) 05/12/2019     WBC       Component Value Date/Time   WBC 10.6 (H) 05/21/2019 1800   ANC    Component Value Date/Time   NEUTROABS 8.7 (H) 05/05/2019 1800     Plt: Lab Results  Component Value Date   PLT 601 (H) 05/22/2019     Lactic Acid, Venous    Component Value Date/Time   LATICACIDVEN 3.2 (HH) 05/09/2019 1052     COVID-19 Labs  No results for input(s): DDIMER, FERRITIN, LDH, CRP in the last 72 hours.  Lab Results  Component Value Date   [place] NEGATIVE 05/08/2019       Venous  Blood Gas result:  pH 7.427  PCO2 18.9      HG/HCT    Stable,     Component Value Date/Time   HGB 8.2 (L) 05/18/2019 1940   HCT 24.0 (L) 05/13/2019 1940        UA not ordered         CXR - fluid overload     ECG:  Personally reviewed by me showing: HR : 140  Rhythm:   A.fib. W RVR    no evidence of ischemic changes QTC 497      ED Triage Vitals  Enc Vitals Group     BP 05/19/2019 1812 127/71     Pulse Rate 05/16/2019 1812 (!) 144     Resp 05/12/2019 1812 18     Temp  05/22/2019 1812 98.6 F (37 C)     Temp Source 05/03/2019 1812 Oral     SpO2 05/21/2019 1812 100 %     Weight --      Height --      Head Circumference --      Peak Flow --      Pain Score 05/09/2019 1759 0     Pain Loc --      Pain Edu? --      Excl. in [place]? --   TMAX(24)@       Latest  Blood pressure 117/80, pulse (!) 110, temperature 98.6 F (37 C), temperature source Oral, resp. rate (!) 31, SpO2 100 %.     Hospitalist was called for admission for Anemia, A.fib w RVR   Review of Systems:    Pertinent positives include: vaginal bleed  Constitutional:  No weight loss, night sweats, Fevers, chills, fatigue, weight loss  HEENT:  No headaches, Difficulty swallowing,Tooth/dental problems,Sore throat,  No sneezing, itching, ear ache, nasal congestion, post nasal drip,  Cardio-vascular:  No chest pain, Orthopnea, PND, anasarca,  dizziness, palpitations.no Bilateral lower extremity swelling  GI:  No heartburn, indigestion, abdominal pain, nausea, vomiting, diarrhea, change in bowel habits, loss of appetite, melena, blood in stool, hematemesis Resp:  no shortness of breath at rest. No dyspnea on exertion, No excess mucus, no productive cough, No non-productive cough, No coughing up of blood.No change in color of mucus.No wheezing. Skin:  no rash or lesions. No jaundice GU:  no dysuria, change in color of urine, no urgency or frequency. No straining to urinate.  No flank pain.  Musculoskeletal:  No joint pain or no joint swelling. No decreased range of motion. No back pain.  Psych:  No change in mood or affect. No depression or anxiety. No memory loss.  Neuro: no localizing neurological complaints, no tingling, no weakness, no double vision, no gait abnormality, no slurred speech, no confusion  All systems reviewed and apart from [place] all are negative  Past Medical History:   Past Medical History:  Diagnosis Date  . Arthritis    "left knee" (10/09/2012)  . Atrial fibrillation  ([place])   . Carotid artery occlusion    a. Carotid US (12/10/13):  R 60-79%; L 1-39% - f/u 6 mos  . Glaucoma (increased eye pressure)    "both eyes" (10/09/2012)  . Gout   . Hx of echocardiogram    a. Echo (09/2012):  Mild LVH, EF 55-65%, Gr 1 DD, MAC, mild MR, mild to mod LAE, mild RVE, PASP 44 mmHg  . Hypertension   . Stroke [facility]) 10/09/2012     Past Surgical History:  Procedure Laterality Date  . BREAST BIOPSY  1980's   "left; it wasn't anything" (10/09/2012)  . COLONOSCOPY WITH PROPOFOL N/A 12/12/2016   Procedure: COLONOSCOPY WITH PROPOFOL;  Surgeon: Garlan Fair, MD;  Location: [facility];  Service: Endoscopy;  Laterality: N/A;  . COLONOSCOPY WITH PROPOFOL N/A 05/09/2019   Procedure: COLONOSCOPY WITH PROPOFOL;  Surgeon: Laurence Spates, MD;  Location: [place];  Service: Endoscopy;  Laterality: N/A;  . ESOPHAGOGASTRODUODENOSCOPY (EGD) WITH PROPOFOL N/A 05/08/2019   Procedure: ESOPHAGOGASTRODUODENOSCOPY (EGD) WITH PROPOFOL;  Surgeon: Ronnette Juniper, MD;  Location: [place];  Service: Gastroenterology;  Laterality: N/A;  . HEMOSTASIS CLIP PLACEMENT  05/08/2019   Procedure: HEMOSTASIS CLIP PLACEMENT;  Surgeon: Ronnette Juniper, MD;  Location: [place];  Service: Gastroenterology;;  . IR PARACENTESIS  09/25/2018  . IR PARACENTESIS  10/16/2018  . KNEE ARTHROSCOPY W/ DEBRIDEMENT  2011   "left; for arthritis" (10/09/2012)    Social History:  Ambulatory  Independently     reports that she has never smoked. She has never used smokeless tobacco. She reports that she does not drink alcohol or use drugs.   Family History:   Family History  Problem Relation Age of Onset  . Heart failure Mother 93       Died  . Diabetes Mother   . Diabetes Father   . CAD Father 3    Allergies: No Known Allergies   Prior to Admission medications   Medication Sig Start Date End Date Taking? Authorizing Provider  allopurinol (ZYLOPRIM) 100 MG tablet Take 100 mg by mouth every evening.   Yes  [provider]  amoxicillin-clavulanate (AUGMENTIN) 875-125 MG tablet Take 1 tablet by mouth every 12 (twelve) hours for 7 days. 05/22/19 06-15-19 Yes Hall, Carole N, DO  atorvastatin (LIPITOR) 40 MG tablet Take 40 mg by mouth every evening.  05/06/15  Yes [provider]  Brinzolamide-Brimonidine (SIMBRINZA) 1-0.2 % SUSP Place 1 drop into  both eyes at bedtime.   Yes [provider]  diltiazem (CARTIA XT) 240 MG 24 hr capsule Take 240 mg by mouth every evening.    Yes [provider]  furosemide (LASIX) 20 MG tablet Take 1 tablet (20 mg total) by mouth daily. 05/22/19  Yes Irene Pap N, DO  lactulose (CHRONULAC) 10 GM/15ML solution Take 10 g by mouth daily as needed for mild constipation.  11/06/18  Yes [provider]  latanoprost (XALATAN) 0.005 % ophthalmic solution Place 1 drop into both eyes at bedtime.   Yes [provider]  pantoprazole (PROTONIX) 40 MG tablet Take 1 tablet (40 mg total) by mouth daily. Patient taking differently: Take 40 mg by mouth every evening.  05/23/19  Yes Kayleen Memos, DO  spironolactone (ALDACTONE) 50 MG tablet Take 1 tablet (50 mg total) by mouth daily. Patient taking differently: Take 50 mg by mouth every evening.  05/23/19  Yes Hall, Carole N, DO  XARELTO 20 MG TABS tablet TAKE 1 TABLET (20 MG TOTAL) BY MOUTH DAILY WITH SUPPER. Patient taking differently: Take 20 mg by mouth daily.  05/23/19  Yes Minus Breeding, MD   Physical Exam: Blood pressure 117/80, pulse (!) 110, temperature 98.6 F (37 C), temperature source Oral, resp. rate (!) 31, SpO2 100 %. 1. General:  in No Acute distress   Chronically ill  -appearing 2. Psychological: Alert and  Oriented 3. Head/ENT:    Dry Mucous Membranes                          Head Non traumatic, neck supple                            Poor Dentition 4. SKIN: normal   Skin turgor,  Skin clean Dry and intact no rash 5. Heart: Regular rate and rhythm no Murmur, no Rub or  gallop 6. Lungs: no wheezes or crackles   7. Abdomen: Soft,   non-tender, Non distended   obese  bowel sounds present 8. Lower extremities: no clubbing, cyanosis, minimal edema 9. Neurologically Grossly intact, moving all 4 extremities equally  10. MSK: Normal range of motion   All other LABS:     Recent Labs  Lab 05/21/19 1052 05/19/2019 1800 05/21/2019 1940  WBC 9.1 10.6*  --   NEUTROABS  --  8.7*  --   HGB 8.2* 7.9* 8.2*  HCT 27.6* 27.8* 24.0*  MCV 86.8 87.1  --   PLT 333 601*  --      Recent Labs  Lab 05/25/2019 1800 05/10/2019 1940  NA 142 145  K 3.9 3.9  CL 112*  --   CO2 13*  --   GLUCOSE 113*  --   BUN 26*  --   CREATININE 1.83*  --   CALCIUM 8.9  --      Recent Labs  Lab 05/06/2019 1930  AST 34  ALT 29  ALKPHOS 124  BILITOT 1.2  PROT 7.4  ALBUMIN 3.0*       Cultures: No results found for: SDES, SPECREQUEST, CULT, REPTSTATUS   Radiological Exams on Admission: Dg Chest Portable 1 View  Result Date: 05/26/2019 CLINICAL DATA:  Atrial fib.  Leg edema EXAM: PORTABLE CHEST 1 VIEW COMPARISON:  05/09/2019 FINDINGS: Cardiac enlargement. Progression of small bilateral effusions and bibasilar atelectasis. Negative for edema. Endotracheal tube removed, NG tube removed, central line removed compared to the  prior study. IMPRESSION: Cardiac enlargement with progression of small bilateral effusions and bibasilar atelectasis suggesting fluid overload. Electronically Signed   By: Franchot Gallo M.D.   On: 05/07/2019 19:28    Chart has been reviewed   Assessment/Plan   68 y.o. female with medical history significant of a.fib on Xarelto, oSA on c.PAP, tricuspid regurgitation and pulmonary hypertension ascites, CKD, renal artery occlusion, stroke hypertension small gastric erosion Admitted for with overload in the setting of right heart failure A.fib w RVR  Present on Admission: . Atrial fibrillation with RVR (HCC) hold anticoagulation secondary to ongoing bleeding now  vaginal.  A. fib with RVR in the setting of not taking her home medications tonight.  Will admit to stepdown on diltiazem drip for now hopefully will be able to convert to p.o. tomorrow  . Acute blood loss anemia -repeat hemoglobin closer to baseline I wonder if previous drop of hemoglobin was more dilutional in nature although patient has been having some vaginal bleeding as well. Continue to monitor CBC patient will need to follow-up with OB/GYN regarding postmenstrual vaginal bleeding  . AKI (acute kidney injury) ([place]) -obtain urine electrolytes given history of cardiac disease as well as hepatic disease unsure if renal dysfunction is in the setting of hepatorenal syndrome and appears to be more of a fluid up at this point monitor creatinine carefully while being gently diuresed  . Hypertension, essential-  currently somewhat soft blood pressures monitor in stepdown hold off of spironolactone for now  . Liver cirrhosis (HCC) -continue lactulose.  Patient likely has some degree of ascites no abdominal pain or fever to suggest SBP.  Fluid status improved with IV Lasix no tense ascites noted no indication for need of paracentesis tonight Check INR, expected to be somewhat elevated given the patient's being on DOA C  continue lactulose  . Obesity -chronic will need follow-up as an outpatient nutritionist . Other ascites currently appears to be stable after diuresis . Pulmonary HTN ([place]) difficult to manage but may be contributing to sensation of dyspnea.  Appreciate cardiology input  . Sleep apnea -continue CPAP  . Acute on chronic right heart failure (HCC) -gently diurese and monitor fluid status check echogram  . Metabolic acidosis check lactic acid could be elevated in the setting of history of liver disease VBG showing well compensated by respiratory drive which may explain tachypnea Monitor in stepdown continue to follow may benefit from bicarb replacement   History of left arm  cellulitis continue Augmentin   Other plan as per orders.  DVT prophylaxis:  SCD        Code Status:  FULL CODE as per patient   I had personally discussed CODE STATUS with patient   Family Communication:   Family not at  Bedside    Disposition Plan:    To home once workup is complete and patient is stable                                        Consults called:    emailed cardiology  Admission status:  ED Disposition    ED Disposition Condition [place]: [place] [[id number]]  Level of Care: Progressive [102]  I expect the patient will be discharged within 24 hours: No (not a candidate for 5C-Observation unit)  Covid Evaluation: Asymptomatic Screening Protocol (No Symptoms)  Diagnosis: Atrial fibrillation with  RVR [facility]) [[id number]]  Admitting Physician: Toy Baker [[id number]]  Attending Physician: Toy Baker [[id number]]  PT Class (Do Not Modify): Observation [104]  PT Acc Code (Do Not Modify): Observation [10022]          Level of care         SDU tele indefinitely please discontinue once patient no longer qualifies  Precautions:   Droplet,  No active isolations  PPE: Used by the provider:   P100  eye Goggles,  Gloves     Elysha Daw 05/03/2019, 10:43 PM    [facility]     after 2 AM please page floor coverage PA If 7AM-7PM, please contact the day team taking care of the patient using [email or web address]

## 2019-05-27 NOTE — ED Triage Notes (Signed)
Pt has hx of a-fib and heart rate in triage is 137 to 144 and is A-FIB RVR

## 2019-05-27 NOTE — ED Triage Notes (Signed)
Discharged from here 1 week ago and began her period when she got home. Hemoglobin was low but went to pcp today and her hbg was 7.7. Pt says she is still bleeding. No abdominal pain

## 2019-05-27 NOTE — ED Provider Notes (Signed)
[facility] EMERGENCY DEPARTMENT Provider Note   CSN: [id number] Arrival date & time: 05/24/2019  1713     History   Chief Complaint Chief Complaint  Patient presents with  . Abnormal Lab    HPI GLYNIS Holt is a 68 y.o. female.     HPI Patient was recently admitted to the hospital for rectal bleeding and shortness of breath.  She was admitted from July 8 to July 22.  Patient was intubated during her hospital stay.  She was in hemorrhagic shock.  Patient has history of stroke and she was continued on her anticoagulants.  Patient states her gastrointestinal bleeding stopped.  She started to have vaginal bleeding.  She states that has continued this past week.  She mentioned to her primary care doctor who is referred her to an OB/GYN.  Patient had laboratory tests done today as an outpatient.  She was told her hemoglobin was 7.6 and to come to the hospital. Past Medical History:  Diagnosis Date  . Arthritis    "left knee" (10/09/2012)  . Atrial fibrillation ([place])   . Carotid artery occlusion    a. Carotid US (12/10/13):  R 60-79%; L 1-39% - f/u 6 mos  . Glaucoma (increased eye pressure)    "both eyes" (10/09/2012)  . Gout   . Hx of echocardiogram    a. Echo (09/2012):  Mild LVH, EF 55-65%, Gr 1 DD, MAC, mild MR, mild to mod LAE, mild RVE, PASP 44 mmHg  . Hypertension   . Stroke [facility]) 10/09/2012    Patient Active Problem List   Diagnosis Date Noted  . Left arm cellulitis 05/14/2019  . GI bleed 05/08/2019  . Acute blood loss anemia 05/08/2019  . Hemorrhagic shock ([place]) 05/08/2019  . Respiratory insufficiency   . AKI (acute kidney injury) ([place]) 11/29/2018  . Hyperkalemia 11/29/2018  . Liver cirrhosis ([place]) 11/29/2018  . Chronic atrial fibrillation 11/14/2018  . Pulmonary HTN ([place]) 11/14/2018  . Other ascites 11/14/2018  . Permanent atrial fibrillation 08/16/2018  . Dyslipidemia 08/16/2018  . Bilateral carotid artery stenosis 02/14/2017  . Sleep  apnea 02/14/2017  . Excessive daytime sleepiness 06/01/2015  . Carotid stenosis 02/10/2014  . HLD (hyperlipidemia) 02/10/2014  . Paroxysmal atrial fibrillation ([place]) 12/09/2013  . Hypertension, essential 10/11/2012  . Altered sensation due to recent cerebral infarction 10/11/2012  . CVA (cerebral infarction) 10/09/2012  . Elevated troponin 10/09/2012  . Gout 10/09/2012  . Glaucoma 10/09/2012  . Obesity 10/09/2012  . Disturbance of skin sensation 10/09/2012    Past Surgical History:  Procedure Laterality Date  . BREAST BIOPSY  1980's   "left; it wasn't anything" (10/09/2012)  . COLONOSCOPY WITH PROPOFOL N/A 12/12/2016   Procedure: COLONOSCOPY WITH PROPOFOL;  Surgeon: Garlan Fair, MD;  Location: [facility];  Service: Endoscopy;  Laterality: N/A;  . COLONOSCOPY WITH PROPOFOL N/A 05/09/2019   Procedure: COLONOSCOPY WITH PROPOFOL;  Surgeon: Laurence Spates, MD;  Location: [facility];  Service: Endoscopy;  Laterality: N/A;  . ESOPHAGOGASTRODUODENOSCOPY (EGD) WITH PROPOFOL N/A 05/08/2019   Procedure: ESOPHAGOGASTRODUODENOSCOPY (EGD) WITH PROPOFOL;  Surgeon: Ronnette Juniper, MD;  Location: [facility];  Service: Gastroenterology;  Laterality: N/A;  . HEMOSTASIS CLIP PLACEMENT  05/08/2019   Procedure: HEMOSTASIS CLIP PLACEMENT;  Surgeon: Ronnette Juniper, MD;  Location: [facility];  Service: Gastroenterology;;  . IR PARACENTESIS  09/25/2018  . IR PARACENTESIS  10/16/2018  . KNEE ARTHROSCOPY W/ DEBRIDEMENT  2011   "left; for arthritis" (10/09/2012)     OB History  No obstetric history on file.      Home Medications    Prior to Admission medications   Medication Sig Start Date End Date Taking? Authorizing Provider  allopurinol (ZYLOPRIM) 100 MG tablet Take 100 mg by mouth every evening.    [provider]  amoxicillin-clavulanate (AUGMENTIN) 875-125 MG tablet Take 1 tablet by mouth every 12 (twelve) hours for 7 days. 05/22/19 06/15/19  Kayleen Memos, DO  atorvastatin (LIPITOR)  40 MG tablet Take 40 mg by mouth every evening.  05/06/15   [provider]  diltiazem (CARTIA XT) 240 MG 24 hr capsule Take 240 mg by mouth daily.    [provider]  furosemide (LASIX) 20 MG tablet Take 1 tablet (20 mg total) by mouth daily. 05/22/19   Kayleen Memos, DO  lactulose (CHRONULAC) 10 GM/15ML solution Take 10-50 g by mouth See admin instructions. Take 10-50 grams (15-75 ml's) daily AS DIRECTED 11/06/18   [provider]  latanoprost (XALATAN) 0.005 % ophthalmic solution Place 1 drop into both eyes at bedtime.    [provider]  pantoprazole (PROTONIX) 40 MG tablet Take 1 tablet (40 mg total) by mouth daily. 05/23/19   Hall, Carole N, DO  SIMBRINZA 1-0.2 % SUSP Place 1 drop into both eyes 2 (two) times daily.  03/28/18   [provider]  spironolactone (ALDACTONE) 50 MG tablet Take 1 tablet (50 mg total) by mouth daily. 05/23/19   Hall, Carole N, DO  XARELTO 20 MG TABS tablet TAKE 1 TABLET (20 MG TOTAL) BY MOUTH DAILY WITH SUPPER. 05/23/19   Minus Breeding, MD    Family History Family History  Problem Relation Age of Onset  . Heart failure Mother 13       Died  . Diabetes Mother   . Diabetes Father   . CAD Father 23    Social History Social History   Tobacco Use  . Smoking status: Never Smoker  . Smokeless tobacco: Never Used  Substance Use Topics  . Alcohol use: No    Alcohol/week: 0.0 standard drinks  . Drug use: No     Allergies   Patient has no known allergies.   Review of Systems Review of Systems  All other systems reviewed and are negative.    Physical Exam Updated Vital Signs BP 120/84   Pulse (!) 144   Temp 98.6 F (37 C) (Oral)   Resp 20   SpO2 99%   Physical Exam Vitals signs and nursing note reviewed.  Constitutional:      General: She is not in acute distress.    Appearance: She is well-developed.  HENT:     Head: Normocephalic and atraumatic.     Right Ear: External ear normal.     Left Ear:  External ear normal.  Eyes:     General: No scleral icterus.       Right eye: No discharge.        Left eye: No discharge.     Conjunctiva/sclera: Conjunctivae normal.  Neck:     Musculoskeletal: Neck supple.     Trachea: No tracheal deviation.  Cardiovascular:     Rate and Rhythm: Tachycardia present. Rhythm irregular.  Pulmonary:     Effort: Pulmonary effort is normal. No respiratory distress.     Breath sounds: Normal breath sounds. No stridor. No wheezing or rales.  Abdominal:     General: Bowel sounds are normal. There is no distension.     Palpations: Abdomen is soft.  Tenderness: There is no abdominal tenderness. There is no guarding or rebound.  Musculoskeletal:        General: No tenderness.     Right lower leg: Edema present.     Left lower leg: Edema present.  Skin:    General: Skin is warm and dry.     Findings: No rash.  Neurological:     Mental Status: She is alert.     Cranial Nerves: No cranial nerve deficit (no facial droop, extraocular movements intact, no slurred speech).     Sensory: No sensory deficit.     Motor: No abnormal muscle tone or seizure activity.     Coordination: Coordination normal.      ED Treatments / Results  Labs (all labs ordered are listed, but only abnormal results are displayed) Labs Reviewed  CBC WITH DIFFERENTIAL/PLATELET - Abnormal; Notable for the following components:      Result Value   WBC 10.6 (*)    RBC 3.19 (*)    Hemoglobin 7.9 (*)    HCT 27.8 (*)    MCH 24.8 (*)    MCHC 28.4 (*)    RDW 17.4 (*)    Platelets 601 (*)    nRBC 1.0 (*)    Neutro Abs 8.7 (*)    All other components within normal limits  BASIC METABOLIC PANEL - Abnormal; Notable for the following components:   Chloride 112 (*)    CO2 13 (*)    Glucose, Bld 113 (*)    BUN 26 (*)    Creatinine, Ser 1.83 (*)    GFR calc non Af Amer 28 (*)    GFR calc Af Amer 32 (*)    Anion gap 17 (*)    All other components within normal limits  TYPE AND  SCREEN    EKG EKG Interpretation  Date/Time:  Monday May 27 2019 18:06:38 EDT Ventricular Rate:  140 PR Interval:    QRS Duration: 86 QT Interval:  326 QTC Calculation: 497 R Axis:   72 Text Interpretation:   Poor data quality, interpretation may be adversely affected Atrial fibrillation with rapid ventricular response Low voltage QRS Abnormal ECG ischemic changes noted on prior ECG resolved Confirmed by Dorie Rank [phone]) on 05/25/2019 7:00:25 PM   Radiology No results found.  Procedures Procedures (including critical care time)  Medications Ordered in ED Medications  diltiazem (CARDIZEM) 1 mg/mL load via infusion 15 mg (has no administration in time range)    And  diltiazem (CARDIZEM) 100 mg in dextrose 5% 157m (1 mg/mL) infusion (has no administration in time range)     Initial Impression / Assessment and Plan / ED Course  I have reviewed the triage vital signs and the nursing notes.  Pertinent labs & imaging results that were available during my care of the patient were reviewed by me and considered in my medical decision making (see chart for details).  Clinical Course as of May 27 1919  Mon May 27, 2019  1918 Hemoglobin slightly decreased from previous value.   [JK]  1919 Electrolyte panel notable for AKI and anion gap metabolic acidosis.   [JK]    Clinical Course User Index [JK] KDorie Rank MD     Pt recently discharged from the hospital for GI bleeding.  Followed up with PCP.  Noted to have worsening hemoglobin.  Now complaining of vaginal bleeding. Labs in the ED show slight decrease in hemoglobin.  Still on anticoagulation. Electrolytes notable for decreased bicarb, aki.  BNP elevated. BP stable.  Admitted to the hospital in stable condition.  Final Clinical Impressions(s) / ED Diagnoses  Anemia, Metabolic acidosis   Dorie Rank, MD 05/31/19 [phone]

## 2019-05-27 NOTE — ED Notes (Signed)
Spoke with pt's sister, Marice Guidone, call for updates or if needed. [phone]

## 2019-05-28 ENCOUNTER — Inpatient Hospital Stay (HOSPITAL_COMMUNITY): Payer: Medicare Other

## 2019-05-28 ENCOUNTER — Ambulatory Visit (HOSPITAL_COMMUNITY): Payer: Medicare Other

## 2019-05-28 ENCOUNTER — Encounter (HOSPITAL_COMMUNITY): Payer: Self-pay | Admitting: Cardiology

## 2019-05-28 ENCOUNTER — Telehealth: Payer: Self-pay | Admitting: Adult Health

## 2019-05-28 DIAGNOSIS — Z452 Encounter for adjustment and management of vascular access device: Secondary | ICD-10-CM | POA: Diagnosis not present

## 2019-05-28 DIAGNOSIS — I219 Acute myocardial infarction, unspecified: Secondary | ICD-10-CM | POA: Diagnosis present

## 2019-05-28 DIAGNOSIS — K7460 Unspecified cirrhosis of liver: Secondary | ICD-10-CM | POA: Diagnosis present

## 2019-05-28 DIAGNOSIS — I10 Essential (primary) hypertension: Secondary | ICD-10-CM | POA: Diagnosis not present

## 2019-05-28 DIAGNOSIS — K7581 Nonalcoholic steatohepatitis (NASH): Secondary | ICD-10-CM | POA: Diagnosis present

## 2019-05-28 DIAGNOSIS — G4730 Sleep apnea, unspecified: Secondary | ICD-10-CM | POA: Diagnosis not present

## 2019-05-28 DIAGNOSIS — I4891 Unspecified atrial fibrillation: Secondary | ICD-10-CM | POA: Diagnosis not present

## 2019-05-28 DIAGNOSIS — I517 Cardiomegaly: Secondary | ICD-10-CM | POA: Diagnosis not present

## 2019-05-28 DIAGNOSIS — I130 Hypertensive heart and chronic kidney disease with heart failure and stage 1 through stage 4 chronic kidney disease, or unspecified chronic kidney disease: Secondary | ICD-10-CM | POA: Diagnosis present

## 2019-05-28 DIAGNOSIS — D649 Anemia, unspecified: Secondary | ICD-10-CM | POA: Diagnosis not present

## 2019-05-28 DIAGNOSIS — I2510 Atherosclerotic heart disease of native coronary artery without angina pectoris: Secondary | ICD-10-CM | POA: Diagnosis present

## 2019-05-28 DIAGNOSIS — R188 Other ascites: Secondary | ICD-10-CM | POA: Diagnosis present

## 2019-05-28 DIAGNOSIS — Z515 Encounter for palliative care: Secondary | ICD-10-CM | POA: Diagnosis not present

## 2019-05-28 DIAGNOSIS — I5082 Biventricular heart failure: Secondary | ICD-10-CM | POA: Diagnosis present

## 2019-05-28 DIAGNOSIS — I509 Heart failure, unspecified: Secondary | ICD-10-CM

## 2019-05-28 DIAGNOSIS — G4733 Obstructive sleep apnea (adult) (pediatric): Secondary | ICD-10-CM | POA: Diagnosis present

## 2019-05-28 DIAGNOSIS — R570 Cardiogenic shock: Secondary | ICD-10-CM | POA: Diagnosis present

## 2019-05-28 DIAGNOSIS — Z20828 Contact with and (suspected) exposure to other viral communicable diseases: Secondary | ICD-10-CM | POA: Diagnosis present

## 2019-05-28 DIAGNOSIS — L03114 Cellulitis of left upper limb: Secondary | ICD-10-CM | POA: Diagnosis present

## 2019-05-28 DIAGNOSIS — D259 Leiomyoma of uterus, unspecified: Secondary | ICD-10-CM | POA: Diagnosis not present

## 2019-05-28 DIAGNOSIS — I361 Nonrheumatic tricuspid (valve) insufficiency: Secondary | ICD-10-CM | POA: Diagnosis not present

## 2019-05-28 DIAGNOSIS — Z79899 Other long term (current) drug therapy: Secondary | ICD-10-CM | POA: Diagnosis not present

## 2019-05-28 DIAGNOSIS — N8500 Endometrial hyperplasia, unspecified: Secondary | ICD-10-CM | POA: Diagnosis not present

## 2019-05-28 DIAGNOSIS — I340 Nonrheumatic mitral (valve) insufficiency: Secondary | ICD-10-CM

## 2019-05-28 DIAGNOSIS — I4821 Permanent atrial fibrillation: Secondary | ICD-10-CM | POA: Diagnosis present

## 2019-05-28 DIAGNOSIS — J90 Pleural effusion, not elsewhere classified: Secondary | ICD-10-CM | POA: Diagnosis not present

## 2019-05-28 DIAGNOSIS — J9690 Respiratory failure, unspecified, unspecified whether with hypoxia or hypercapnia: Secondary | ICD-10-CM | POA: Diagnosis not present

## 2019-05-28 DIAGNOSIS — D62 Acute posthemorrhagic anemia: Secondary | ICD-10-CM

## 2019-05-28 DIAGNOSIS — E872 Acidosis: Secondary | ICD-10-CM | POA: Diagnosis present

## 2019-05-28 DIAGNOSIS — N179 Acute kidney failure, unspecified: Secondary | ICD-10-CM | POA: Diagnosis present

## 2019-05-28 DIAGNOSIS — I5043 Acute on chronic combined systolic (congestive) and diastolic (congestive) heart failure: Secondary | ICD-10-CM | POA: Diagnosis present

## 2019-05-28 DIAGNOSIS — E874 Mixed disorder of acid-base balance: Secondary | ICD-10-CM | POA: Diagnosis present

## 2019-05-28 DIAGNOSIS — J9601 Acute respiratory failure with hypoxia: Secondary | ICD-10-CM | POA: Diagnosis present

## 2019-05-28 DIAGNOSIS — Z8673 Personal history of transient ischemic attack (TIA), and cerebral infarction without residual deficits: Secondary | ICD-10-CM | POA: Diagnosis not present

## 2019-05-28 DIAGNOSIS — Z6841 Body Mass Index (BMI) 40.0 and over, adult: Secondary | ICD-10-CM | POA: Diagnosis not present

## 2019-05-28 DIAGNOSIS — I272 Pulmonary hypertension, unspecified: Secondary | ICD-10-CM | POA: Diagnosis present

## 2019-05-28 DIAGNOSIS — Z7901 Long term (current) use of anticoagulants: Secondary | ICD-10-CM | POA: Diagnosis not present

## 2019-05-28 LAB — CBC
HCT: 27.7 % — ABNORMAL LOW (ref 36.0–46.0)
HCT: 31.5 % — ABNORMAL LOW (ref 36.0–46.0)
Hemoglobin: 8 g/dL — ABNORMAL LOW (ref 12.0–15.0)
Hemoglobin: 8.5 g/dL — ABNORMAL LOW (ref 12.0–15.0)
MCH: 24.4 pg — ABNORMAL LOW (ref 26.0–34.0)
MCH: 24.7 pg — ABNORMAL LOW (ref 26.0–34.0)
MCHC: 27 g/dL — ABNORMAL LOW (ref 30.0–36.0)
MCHC: 28.9 g/dL — ABNORMAL LOW (ref 30.0–36.0)
MCV: 85.5 fL (ref 80.0–100.0)
MCV: 90.5 fL (ref 80.0–100.0)
Platelets: 506 10*3/uL — ABNORMAL HIGH (ref 150–400)
Platelets: 675 10*3/uL — ABNORMAL HIGH (ref 150–400)
RBC: 3.24 MIL/uL — ABNORMAL LOW (ref 3.87–5.11)
RBC: 3.48 MIL/uL — ABNORMAL LOW (ref 3.87–5.11)
RDW: 17.7 % — ABNORMAL HIGH (ref 11.5–15.5)
RDW: 17.8 % — ABNORMAL HIGH (ref 11.5–15.5)
WBC: 11.5 10*3/uL — ABNORMAL HIGH (ref 4.0–10.5)
WBC: 12.9 10*3/uL — ABNORMAL HIGH (ref 4.0–10.5)
nRBC: 1.1 % — ABNORMAL HIGH (ref 0.0–0.2)
nRBC: 2.6 % — ABNORMAL HIGH (ref 0.0–0.2)

## 2019-05-28 LAB — COMPREHENSIVE METABOLIC PANEL
ALT: 30 U/L (ref 0–44)
AST: 46 U/L — ABNORMAL HIGH (ref 15–41)
Albumin: 3.5 g/dL (ref 3.5–5.0)
Alkaline Phosphatase: 135 U/L — ABNORMAL HIGH (ref 38–126)
Anion gap: 19 — ABNORMAL HIGH (ref 5–15)
BUN: 30 mg/dL — ABNORMAL HIGH (ref 8–23)
CO2: 13 mmol/L — ABNORMAL LOW (ref 22–32)
Calcium: 9.1 mg/dL (ref 8.9–10.3)
Chloride: 110 mmol/L (ref 98–111)
Creatinine, Ser: 1.96 mg/dL — ABNORMAL HIGH (ref 0.44–1.00)
GFR calc Af Amer: 30 mL/min — ABNORMAL LOW (ref >60)
GFR calc non Af Amer: 26 mL/min — ABNORMAL LOW (ref >60)
Glucose, Bld: 131 mg/dL — ABNORMAL HIGH (ref 70–99)
Potassium: 3.7 mmol/L (ref 3.5–5.1)
Sodium: 142 mmol/L (ref 135–145)
Total Bilirubin: 1.1 mg/dL (ref 0.3–1.2)
Total Protein: 7.9 g/dL (ref 6.5–8.1)

## 2019-05-28 LAB — BLOOD GAS, ARTERIAL
Acid-base deficit: 14.3 mmol/L — ABNORMAL HIGH (ref 0.0–2.0)
Bicarbonate: 9.4 mmol/L — ABNORMAL LOW (ref 20.0–28.0)
Drawn by: 56037
O2 Content: 6 L/min
O2 Saturation: 98.1 %
Patient temperature: 98.2
pH, Arterial: 7.44 (ref 7.350–7.450)
pO2, Arterial: 114 mmHg — ABNORMAL HIGH (ref 83.0–108.0)

## 2019-05-28 LAB — LACTIC ACID, PLASMA: Lactic Acid, Venous: 3.2 mmol/L (ref 0.5–1.9)

## 2019-05-28 LAB — TSH: TSH: 3.617 u[IU]/mL (ref 0.350–4.500)

## 2019-05-28 LAB — MAGNESIUM: Magnesium: 1.9 mg/dL (ref 1.7–2.4)

## 2019-05-28 LAB — ECHOCARDIOGRAM COMPLETE

## 2019-05-28 LAB — PHOSPHORUS: Phosphorus: 3.5 mg/dL (ref 2.5–4.6)

## 2019-05-28 MED ORDER — ATORVASTATIN CALCIUM 40 MG PO TABS
40.0000 mg | ORAL_TABLET | Freq: Every evening | ORAL | Status: DC
  Administered 2019-05-28: 40 mg via ORAL
  Filled 2019-05-28: qty 1

## 2019-05-28 MED ORDER — FUROSEMIDE 10 MG/ML IJ SOLN
40.0000 mg | Freq: Once | INTRAMUSCULAR | Status: DC
  Administered 2019-05-29: 40 mg via INTRAVENOUS
  Filled 2019-05-28: qty 4

## 2019-05-28 MED ORDER — FUROSEMIDE 10 MG/ML IJ SOLN
80.0000 mg | Freq: Two times a day (BID) | INTRAMUSCULAR | Status: DC
  Administered 2019-05-28: 80 mg via INTRAVENOUS
  Filled 2019-05-28 (×2): qty 8

## 2019-05-28 MED ORDER — FUROSEMIDE 10 MG/ML IJ SOLN
40.0000 mg | Freq: Every day | INTRAMUSCULAR | Status: DC
  Administered 2019-05-28: 40 mg via INTRAVENOUS
  Filled 2019-05-28: qty 4

## 2019-05-28 MED ORDER — PERFLUTREN LIPID MICROSPHERE
1.0000 mL | INTRAVENOUS | Status: AC | PRN
  Administered 2019-05-28: 2 mL via INTRAVENOUS
  Filled 2019-05-28: qty 10

## 2019-05-28 MED ORDER — ALLOPURINOL 100 MG PO TABS
100.0000 mg | ORAL_TABLET | Freq: Every evening | ORAL | Status: DC
  Administered 2019-05-28: 100 mg via ORAL
  Filled 2019-05-28: qty 1

## 2019-05-28 MED ORDER — AMOXICILLIN-POT CLAVULANATE 875-125 MG PO TABS
1.0000 | ORAL_TABLET | Freq: Two times a day (BID) | ORAL | Status: DC
  Administered 2019-05-28 (×3): 1 via ORAL
  Filled 2019-05-28 (×4): qty 1

## 2019-05-28 MED ORDER — SODIUM CHLORIDE 0.9 % IV SOLN: 250.0000 mL | INTRAVENOUS | Status: DC | PRN

## 2019-05-28 MED ORDER — ONDANSETRON HCL 4 MG PO TABS: 4.0000 mg | ORAL_TABLET | Freq: Four times a day (QID) | ORAL | Status: DC | PRN

## 2019-05-28 MED ORDER — SODIUM BICARBONATE 8.4 % IV SOLN
50.0000 meq | Freq: Once | INTRAVENOUS | Status: AC
  Administered 2019-05-29: 50 meq via INTRAVENOUS
  Filled 2019-05-28: qty 50

## 2019-05-28 MED ORDER — SODIUM CHLORIDE 0.9% FLUSH: 3.0000 mL | INTRAVENOUS | Status: DC | PRN

## 2019-05-28 MED ORDER — PANTOPRAZOLE SODIUM 40 MG PO TBEC
40.0000 mg | DELAYED_RELEASE_TABLET | Freq: Every evening | ORAL | Status: DC
  Administered 2019-05-28: 40 mg via ORAL
  Filled 2019-05-28: qty 1

## 2019-05-28 MED ORDER — LACTULOSE 10 GM/15ML PO SOLN: 10.0000 g | Freq: Every day | ORAL | Status: DC | PRN

## 2019-05-28 MED ORDER — METOPROLOL TARTRATE 25 MG PO TABS
25.0000 mg | ORAL_TABLET | Freq: Two times a day (BID) | ORAL | Status: DC
  Administered 2019-05-28: 25 mg via ORAL
  Filled 2019-05-28 (×2): qty 1

## 2019-05-28 MED ORDER — ONDANSETRON HCL 4 MG/2ML IJ SOLN: 4.0000 mg | Freq: Four times a day (QID) | INTRAMUSCULAR | Status: DC | PRN

## 2019-05-28 MED ORDER — SODIUM CHLORIDE 0.9% FLUSH
3.0000 mL | Freq: Two times a day (BID) | INTRAVENOUS | Status: DC
  Administered 2019-05-28: 3 mL via INTRAVENOUS

## 2019-05-28 MED ORDER — LATANOPROST 0.005 % OP SOLN
1.0000 [drp] | Freq: Every day | OPHTHALMIC | Status: DC
  Administered 2019-05-28: 1 [drp] via OPHTHALMIC
  Filled 2019-05-28 (×2): qty 2.5

## 2019-05-28 MED ORDER — ACETAMINOPHEN 325 MG PO TABS: 650.0000 mg | ORAL_TABLET | Freq: Four times a day (QID) | ORAL | Status: DC | PRN

## 2019-05-28 MED ORDER — HYDROCODONE-ACETAMINOPHEN 5-325 MG PO TABS: 1.0000 | ORAL_TABLET | ORAL | Status: DC | PRN

## 2019-05-28 MED ORDER — ACETAMINOPHEN 650 MG RE SUPP: 650.0000 mg | Freq: Four times a day (QID) | RECTAL | Status: DC | PRN

## 2019-05-28 NOTE — Significant Event (Signed)
Rapid Response Event Note  Overview: Time Called: 2300 Arrival Time: 2302 Event Type: Respiratory  Initial Focused Assessment: On arrival, pt sitting up in bed c/o SOB. spO2 99% on 2L [place], RR 47, HR 95, BP 92/81. Pt has had multiple episode of vaginal bleeding throughout day. A&Ox4, MAE, breathing is labored on exam.   Interventions: O2 increased to 6L [place]  CBC, BMP CXR- R moderate and L small pleural effusions abg (7.4/ below range/114/9.4) TRH called and to bedside 1 Amp HCO3 Foley placed  Pt states that respiratory status feels "much better than 1 hour ago" after O2 increase.   Plan of Care (if not transferred): Continue to monitor respiratory status. RN instructed to call with any changes. Will try and offer CPAP again if breathing does not improve.  Event Summary:  called at  2300   Event ended at  [place]

## 2019-05-28 NOTE — Progress Notes (Addendum)
PROGRESS NOTE  Erin Holt HKV:[id number] DOB: 04/15/1951 DOA: 05/26/2019 PCP: Leeroy Cha, MD  HPI/Recap of past 24 hours: HPI Dr Adrian Prows is a 67 y.o. female with medical history significant of a.fib on Xarelto, oSA on c.PAP, tricuspid regurgitation and pulmonary hypertension ascites, CKD, renal artery occlusion, stroke, hypertension, small gastric erosion presented with low hemoglobin counts as was instructed to present to emergency department by her primary care provider. Pt reports a week ago she developed vaginal bleeding feels like a regular menstrual period (postmenopausal), has been changing about 4 pads a day or so.  Patient states she is very sure it is vaginal in nature, on anticoagulation. Patient endorsing some dyspnea, extremity swelling and abdominal swelling which is typical for her fluid overload. States her last thoracentesis was in December.  Of note, about 3 weeks ago, patient had melena and hematochezia and was admitted to the hospital for hemorrhagic shock. Pt had a prolonged hospital stay from 8th-22 th July requiring admission to ICU requiring intubation, seen by gastroenterology Dr. Therisa Doyne had a endoscopy and colonoscopy done showing small gastric erosion, colonoscopy showed diverticuli but no active bleeding. Hemoglobin at the time of discharge on 21 July was 8.2, now 7.7, she was discharged on oral Protonix. Hospitalization was further complicated by left forearm cellulitis and localized abscess, was seen by orthopedics and ID and eventually was discharged home on Augmentin.    In the ED, patient was found to be in A. fib with RVR, with possible volume overload.  Cardiology was consulted.  Hospitalist called to admit patient    Today, still c/o SOB, orthopnea, still with some vaginal bleed. Denies any chest pain, abdominal pain, fever/chills, N/V  Assessment/Plan: Principal Problem:   Atrial fibrillation with RVR ([place]) Active Problems:    Obesity   Hypertension, essential   Sleep apnea   Pulmonary HTN (H[facility])   Other ascites   AKI (acute kidney injury) ([place])   Liver cirrhosis ([facility])   Acute blood loss anemia   Acute on chronic right heart failure ([facility])   Metabolic acidosis   Permanent A. fib with RVR Currently rate uncontrolled Continue diltiazem drip Held anticoagulation Xarelto due to vaginal bleeding Cardiology consulted Monitor closely  Acute combined systolic/diastolic HF/pulmonary hypertension Appears overloaded Chest x-ray showed bibasilar atelectasis, suggesting fluid overload Echo with EF of 55 to 60%, study not technically sufficient to allow evaluation of LV diastolic dysfunction due to A. Fib Repeat echo showed EF of 20 to 25%, with severe global hypokinesis, severe TR Cardiology on board Diuresis with IV Lasix Strict I's and O's, daily weights Telemetry Monitor closely  AKI stage II Creatinine baseline around 1.2, currently 1.96 Likely renal congestion Monitor very closely due to ongoing diuresis  Hypertension Somewhat soft due to ongoing diltiazem drip Monitor closely  Acute on chronic blood loss anemia Hemoglobin somewhat at baseline Likely due to recent vaginal bleeding Pelvic ultrasound pending Plan to consult GYN in am pending result Hold off on anticoagulation  Lactic acidosis Likely 2/2 poor tissue perfusion 5.9--> 3.2, will trend Monitor closely  NASH with cirrhosis with ascites Last paracentesis in December 2019 Abdominal ultrasound for ascites pending Continue lactulose Monitor closely  OSA Continue CPAP  Cellulitis of left arm Continue p.o. Augmentin  Morbid obesity Lifestyle modification advised        Malnutrition Type:      Malnutrition Characteristics:      Nutrition Interventions:       Estimated body mass index is 40.42  kg/m as calculated from the following:   Height as of 05/11/19: 5\' 4"  (1.626 m).   Weight as of 05/22/19: 106.8 kg.      Code Status: Full  Family Communication: Discussed with patient  Disposition Plan: To be determined   Consultants:  Cardiology  Procedures:  None  Antimicrobials:  None  DVT prophylaxis: SCD   Objective: Vitals:   05/28/19 1100 05/28/19 1130 05/28/19 1200 05/28/19 1230  BP: 112/82 (!) 118/92 (!) 120/91 (!) 118/91  Pulse:      Resp: (!) 24 (!) 26 (!) 26 (!) 22  Temp:      TempSrc:      SpO2:        Intake/Output Summary (Last 24 hours) at 05/28/2019 1258 Last data filed at 05/20/2019 2125 Gross per 24 hour  Intake 24.15 ml  Output -  Net 24.15 ml   There were no vitals filed for this visit.  Exam:  General: NAD   Cardiovascular: S1, S2 present  Respiratory:  Bibasilar crackles noted  Abdomen: Soft, nontender, +distended, bowel sounds present  Musculoskeletal: ++bilateral pedal edema noted  Skin: Normal  Psychiatry: Normal mood    Data Reviewed: CBC: Recent Labs  Lab 05/06/2019 1800 05/20/2019 1940 05/31/2019 2321 05/28/19 0340  WBC 10.6*  --   --  11.5*  NEUTROABS 8.7*  --   --   --   HGB 7.9* 8.2* 8.8* 8.5*  HCT 27.8* 24.0* 26.0* 31.5*  MCV 87.1  --   --  90.5  PLT 601*  --   --  347*   Basic Metabolic Panel: Recent Labs  Lab 05/05/2019 1800 05/02/2019 1940 05/22/2019 2321 05/28/19 0340  NA 142 145 143 142  K 3.9 3.9 3.8 3.7  CL 112*  --   --  110  CO2 13*  --   --  13*  GLUCOSE 113*  --   --  131*  BUN 26*  --   --  30*  CREATININE 1.83*  --   --  1.96*  CALCIUM 8.9  --   --  9.1  MG  --   --   --  1.9  PHOS  --   --   --  3.5   GFR: Estimated Creatinine Clearance: 32.7 mL/min (A) (by C-G formula based on SCr of 1.96 mg/dL (H)). Liver Function Tests: Recent Labs  Lab 05/26/2019 1930 05/28/19 0340  AST 34 46*  ALT 29 30  ALKPHOS 124 135*  BILITOT 1.2 1.1  PROT 7.4 7.9  ALBUMIN 3.0* 3.5   No results for input(s): LIPASE, AMYLASE in the last 168 hours. No results for input(s): AMMONIA in the last 168 hours. Coagulation  Profile: Recent Labs  Lab 05/30/2019 2309  INR 3.5*   Cardiac Enzymes: No results for input(s): CKTOTAL, CKMB, CKMBINDEX, TROPONINI in the last 168 hours. BNP (last 3 results) No results for input(s): PROBNP in the last 8760 hours. HbA1C: No results for input(s): HGBA1C in the last 72 hours. CBG: No results for input(s): GLUCAP in the last 168 hours. Lipid Profile: No results for input(s): CHOL, HDL, LDLCALC, TRIG, CHOLHDL, LDLDIRECT in the last 72 hours. Thyroid Function Tests: Recent Labs    05/28/19 0340  TSH 3.617   Anemia Panel: No results for input(s): VITAMINB12, FOLATE, FERRITIN, TIBC, IRON, RETICCTPCT in the last 72 hours. Urine analysis:    Component Value Date/Time   COLORURINE YELLOW 11/29/2018 2315   APPEARANCEUR CLEAR 11/29/2018 2315   LABSPEC 1.012 11/29/2018 2315  [place] 5.0 11/29/2018 2315   GLUCOSEU NEGATIVE 11/29/2018 2315   HGBUR NEGATIVE 11/29/2018 2315   BILIRUBINUR NEGATIVE 11/29/2018 2315   KETONESUR NEGATIVE 11/29/2018 2315   PROTEINUR NEGATIVE 11/29/2018 2315   NITRITE NEGATIVE 11/29/2018 2315   LEUKOCYTESUR NEGATIVE 11/29/2018 2315   Sepsis Labs: @LABRCNTIP (procalcitonin:4,lacticidven:4)  ) Recent Results (from the past 240 hour(s))  SARS Coronavirus 2 (CEPHEID - Performed in [facility] hospital lab), Hosp Order     Status: None   Collection Time: 05/22/2019  9:37 PM   Specimen: Nasopharyngeal Swab  Result Value Ref Range Status   SARS Coronavirus 2 NEGATIVE NEGATIVE Final    Comment: (NOTE) If result is NEGATIVE SARS-CoV-2 target nucleic acids are NOT DETECTED. The SARS-CoV-2 RNA is generally detectable in upper and lower  respiratory specimens during the acute phase of infection. The lowest  concentration of SARS-CoV-2 viral copies this assay can detect is 250  copies / mL. A negative result does not preclude SARS-CoV-2 infection  and should not be used as the sole basis for treatment or other  patient management decisions.  A  negative result may occur with  improper specimen collection / handling, submission of specimen other  than nasopharyngeal swab, presence of viral mutation(s) within the  areas targeted by this assay, and inadequate number of viral copies  (<250 copies / mL). A negative result must be combined with clinical  observations, patient history, and epidemiological information. If result is POSITIVE SARS-CoV-2 target nucleic acids are DETECTED. The SARS-CoV-2 RNA is generally detectable in upper and lower  respiratory specimens dur ing the acute phase of infection.  Positive  results are indicative of active infection with SARS-CoV-2.  Clinical  correlation with patient history and other diagnostic information is  necessary to determine patient infection status.  Positive results do  not rule out bacterial infection or co-infection with other viruses. If result is PRESUMPTIVE POSTIVE SARS-CoV-2 nucleic acids MAY BE PRESENT.   A presumptive positive result was obtained on the submitted specimen  and confirmed on repeat testing.  While 2019 novel coronavirus  (SARS-CoV-2) nucleic acids may be present in the submitted sample  additional confirmatory testing may be necessary for epidemiological  and / or clinical management purposes  to differentiate between  SARS-CoV-2 and other Sarbecovirus currently known to infect humans.  If clinically indicated additional testing with an alternate test  methodology [phone]) is advised. The SARS-CoV-2 RNA is generally  detectable in upper and lower respiratory sp ecimens during the acute  phase of infection. The expected result is Negative. Fact Sheet for Patients:  StrictlyIdeas.no Fact Sheet for Healthcare Providers: BankingDealers.co.za This test is not yet approved or cleared by the [place] FDA and has been authorized for detection and/or diagnosis of SARS-CoV-2 by FDA under an Emergency Use  Authorization (EUA).  This EUA will remain in effect (meaning this test can be used) for the duration of the COVID-19 declaration under Section 564(b)(1) of the Act, 21 U.S.C. section 360bbb-3(b)(1), unless the authorization is terminated or revoked sooner. Performed at [facility], [place] [address], [place], [place] [address]       Studies: Dg Chest Portable 1 View  Result Date: 05/24/2019 CLINICAL DATA:  Atrial fib.  Leg edema EXAM: PORTABLE CHEST 1 VIEW COMPARISON:  05/09/2019 FINDINGS: Cardiac enlargement. Progression of small bilateral effusions and bibasilar atelectasis. Negative for edema. Endotracheal tube removed, NG tube removed, central line removed compared to the prior study. IMPRESSION: Cardiac enlargement with progression of small bilateral  effusions and bibasilar atelectasis suggesting fluid overload. Electronically Signed   By: Franchot Gallo M.D.   On: 05/02/2019 19:28    Scheduled Meds: . allopurinol  100 mg Oral QPM  . amoxicillin-clavulanate  1 tablet Oral Q12H  . atorvastatin  40 mg Oral QPM  . furosemide  40 mg Intravenous Daily  . latanoprost  1 drop Both Eyes QHS  . pantoprazole  40 mg Oral QPM  . sodium chloride flush  3 mL Intravenous Q12H    Continuous Infusions: . sodium chloride    . diltiazem (CARDIZEM) infusion 15 mg/hr (05/28/19 1224)     LOS: 0 days     Alma Friendly, MD [facility]  If 7PM-7AM, please contact night-coverage [email or web address] 05/28/2019, 12:58 PM

## 2019-05-28 NOTE — ED Notes (Addendum)
Assisted pt to bedside commode. Pt resting in bed at this time. Pt HR ~120 after moving from bedside commode to bed.

## 2019-05-28 NOTE — ED Notes (Signed)
Tech assisted pt to restroom in wheelchair, pt reports unable to obtain urine sample d/t vaginal bleeding.

## 2019-05-28 NOTE — Telephone Encounter (Signed)
LVM, reminding pt of her appt and ask pt to call back and be pre-screened for COVID-19.

## 2019-05-28 NOTE — ED Notes (Signed)
Breakfast Tray ordered  

## 2019-05-28 NOTE — Progress Notes (Signed)
  Echocardiogram 2D Echocardiogram has been performed.  Erin Holt 05/28/2019, 2:36 PM

## 2019-05-28 NOTE — Consult Note (Addendum)
Cardiology Consultation:   Patient ID: Erin Holt MRN: [id number]; DOB: 04/20/1951  Admit date: 05/04/2019 Date of Consult: 05/28/2019  Primary Care Provider: Leeroy Cha, MD Primary Cardiologist: Minus Breeding, MD  Primary Electrophysiologist:  None    Patient Profile:   Erin Holt is a 68 y.o. female with a hx of atrial Holt and rate control and anticoagulation has been plan.  Erin Holt has OSA and CPAP, known TR and moderate pulmonary HTN, recent ascites and paracentesis, lower ext edema, CKD with need to stop spironolactone and lasix reduced, also stopped ACE    who is being seen today for the evaluation of CHF at the request of Dr. Willodean Rosenthal.  History of Present Illness:   Erin Holt - rate controlled and on anticoagulation- xarelto. CHA2DS2VASc of 4.   Last echo 11/22/18 with EF 55-60% mild MV regurg.  LA moderately dilated, RA moderately dilated, moderate TR, PA pressure 40 mmHg. Also with carotid disease by doppler Rt 40-59% and Lt 1-39%.   Upper ext dopplers now Findings consistent with acute superficial vein thrombosis involving the left cephalic vein.    Other hx of NASH cirrhosis with hx of paracentesis, CVA, renal artery occlusion and HTN.  Recent admit for with acute GI bleed and acute resp. Distress on vent.  With EGD and colonoscopy no clear source of bleeding.  Did have gastric erosions but no blood. + cellulitis of lt forearm.  Treated with abx.  Discharged 05/22/19.    Erin Holt presented to ER yesterday per PCP with low hgb of 7.7 also found to be in her permanent a Holt with HR 137 to 144.   Also with vaginal bleeding like a regular menstrual period, Erin Holt is post menopausal.  Erin Holt was placed on IV dilt.    EKG:  The EKG was personally reviewed and demonstrates:  [name], repol abnormalities inf and Q waves ant.  though old.  Telemetry:  Telemetry was personally reviewed and demonstrates:  [name]  occ PVC  BNP 1641   Na 142, K+ 3.7 BUN 30 Cr 1.96 AST 46 alk phos 135  Hgb 8.5 WBC 11.5 plts 675  TSH 3.617  CXR Cardiac enlargement with progression of small bilateral effusions and bibasilar atelectasis suggesting fluid overload  Currently BP 112/82 to 120/91 pulse 106 resp elevated 24-29  Echo pending.  Erin Holt has rec'd lasix 40 mg X 1  No I&O  Pt is having to sit up in bed to breathe.  Erin Holt tells me Erin Holt cannot breathe due to abd fluid.    On last admit Erin Holt was 206.58 lbs and at discharge was 234.8 lbs -no current wt. But severe volume overload.  No chest pain.  Heart Pathway Score:     Past Medical History:  Diagnosis Date  . Arthritis    "left knee" (10/09/2012)  . Atrial fibrillation ([place])   . Carotid artery occlusion    a. Carotid US (12/10/13):  R 60-79%; L 1-39% - f/u 6 mos  . Glaucoma (increased eye pressure)    "both eyes" (10/09/2012)  . Gout   . Hx of echocardiogram    a. Echo (09/2012):  Mild LVH, EF 55-65%, Gr 1 DD, MAC, mild MR, mild to mod LAE, mild RVE, PASP 44 mmHg  . Hypertension   . Stroke [facility]) 10/09/2012    Past Surgical History:  Procedure Laterality Date  . BREAST BIOPSY  1980's   "left; it wasn't  anything" (10/09/2012)  . COLONOSCOPY WITH PROPOFOL N/A 12/12/2016   Procedure: COLONOSCOPY WITH PROPOFOL;  Surgeon: Garlan Fair, MD;  Location: [facility];  Service: Endoscopy;  Laterality: N/A;  . COLONOSCOPY WITH PROPOFOL N/A 05/09/2019   Procedure: COLONOSCOPY WITH PROPOFOL;  Surgeon: Laurence Spates, MD;  Location: [place];  Service: Endoscopy;  Laterality: N/A;  . ESOPHAGOGASTRODUODENOSCOPY (EGD) WITH PROPOFOL N/A 05/08/2019   Procedure: ESOPHAGOGASTRODUODENOSCOPY (EGD) WITH PROPOFOL;  Surgeon: Ronnette Juniper, MD;  Location: [place];  Service: Gastroenterology;  Laterality: N/A;  . HEMOSTASIS CLIP PLACEMENT  05/08/2019   Procedure: HEMOSTASIS CLIP PLACEMENT;  Surgeon: Ronnette Juniper, MD;  Location: [place];  Service: Gastroenterology;;  . IR PARACENTESIS  09/25/2018   . IR PARACENTESIS  10/16/2018  . KNEE ARTHROSCOPY W/ DEBRIDEMENT  2011   "left; for arthritis" (10/09/2012)     Home Medications:  Prior to Admission medications   Medication Sig Start Date End Date Taking? Authorizing Provider  allopurinol (ZYLOPRIM) 100 MG tablet Take 100 mg by mouth every evening.   Yes [provider]  amoxicillin-clavulanate (AUGMENTIN) 875-125 MG tablet Take 1 tablet by mouth every 12 (twelve) hours for 7 days. 05/22/19 2019/05/30 Yes Hall, Carole N, DO  atorvastatin (LIPITOR) 40 MG tablet Take 40 mg by mouth every evening.  05/06/15  Yes [provider]  Brinzolamide-Brimonidine (SIMBRINZA) 1-0.2 % SUSP Place 1 drop into both eyes at bedtime.   Yes [provider]  diltiazem (CARTIA XT) 240 MG 24 hr capsule Take 240 mg by mouth every evening.    Yes [provider]  furosemide (LASIX) 20 MG tablet Take 1 tablet (20 mg total) by mouth daily. 05/22/19  Yes Irene Pap N, DO  lactulose (CHRONULAC) 10 GM/15ML solution Take 10 g by mouth daily as needed for mild constipation.  11/06/18  Yes [provider]  latanoprost (XALATAN) 0.005 % ophthalmic solution Place 1 drop into both eyes at bedtime.   Yes [provider]  pantoprazole (PROTONIX) 40 MG tablet Take 1 tablet (40 mg total) by mouth daily. Patient taking differently: Take 40 mg by mouth every evening.  05/23/19  Yes Kayleen Memos, DO  spironolactone (ALDACTONE) 50 MG tablet Take 1 tablet (50 mg total) by mouth daily. Patient taking differently: Take 50 mg by mouth every evening.  05/23/19  Yes Hall, Carole N, DO  XARELTO 20 MG TABS tablet TAKE 1 TABLET (20 MG TOTAL) BY MOUTH DAILY WITH SUPPER. Patient taking differently: Take 20 mg by mouth daily.  05/23/19  Yes Minus Breeding, MD    Inpatient Medications: Scheduled Meds: . allopurinol  100 mg Oral QPM  . amoxicillin-clavulanate  1 tablet Oral Q12H  . atorvastatin  40 mg Oral QPM  . furosemide  40 mg Intravenous  Daily  . latanoprost  1 drop Both Eyes QHS  . pantoprazole  40 mg Oral QPM  . sodium chloride flush  3 mL Intravenous Q12H   Continuous Infusions: . sodium chloride    . diltiazem (CARDIZEM) infusion 12.5 mg/hr (05/28/19 0916)   PRN Meds: sodium chloride, acetaminophen **OR** acetaminophen, HYDROcodone-acetaminophen, lactulose, ondansetron **OR** ondansetron (ZOFRAN) IV, sodium chloride flush  Allergies:   No Known Allergies  Social History:   Social History   Socioeconomic History  . Marital status: Single    Spouse name: Not on file  . Number of children: Not on file  . Years of education: Not on file  . Highest education level: Not on file  Occupational History  . Not  on file  Social Needs  . Financial resource strain: Not on file  . Food insecurity    Worry: Not on file    Inability: Not on file  . Transportation needs    Medical: Not on file    Non-medical: Not on file  Tobacco Use  . Smoking status: Never Smoker  . Smokeless tobacco: Never Used  Substance and Sexual Activity  . Alcohol use: No    Alcohol/week: 0.0 standard drinks  . Drug use: No  . Sexual activity: Never  Lifestyle  . Physical activity    Days per week: Not on file    Minutes per session: Not on file  . Stress: Not on file  Relationships  . Social Herbalist on phone: Not on file    Gets together: Not on file    Attends religious service: Not on file    Active member of club or organization: Not on file    Attends meetings of clubs or organizations: Not on file    Relationship status: Not on file  . Intimate partner violence    Fear of current or ex partner: Not on file    Emotionally abused: Not on file    Physically abused: Not on file    Forced sexual activity: Not on file  Other Topics Concern  . Not on file  Social History Narrative   Lives alone.    Family History:    Family History  Problem Relation Age of Onset  . Heart failure Mother 58       Died  .  Diabetes Mother   . Diabetes Father   . CAD Father 57     ROS:  Please see the history of present illness.  General:no colds or fevers, no weight changes Skin:no rashes or ulcers HEENT:no blurred vision, no congestion CV:see HPI PUL:see HPI GI:no diarrhea constipation or melena, no indigestion GU:no hematuria, no dysuria MS:no joint pain, no claudication Neuro:no syncope, no lightheadedness Endo:no diabetes, no thyroid disease  All other ROS reviewed and negative.     Physical Exam/Data:   Vitals:   05/28/19 0830 05/28/19 0900 05/28/19 1030 05/28/19 1100  BP: (!) 120/92 120/80 110/86 112/82  Pulse:      Resp:  (!) 24 (!) 31 (!) 24  Temp:      TempSrc:      SpO2: 100%       Intake/Output Summary (Last 24 hours) at 05/28/2019 1131 Last data filed at 05/01/2019 2125 Gross per 24 hour  Intake 24.15 ml  Output -  Net 24.15 ml   Last 3 Weights 05/22/2019 05/22/2019 05/21/2019  Weight (lbs) 235 lb 8 oz (No Data) 232 lb  Weight (kg) 106.822 kg (No Data) 105.235 kg     There is no height or weight on file to calculate BMI.  General:  Well nourished, well developed, in no acute distress if sits upright on side of bed HEENT: normal Lymph: no adenopathy Neck: no JVD sitting up right, may change once Erin Holt lies down Endocrine:  No thryomegaly Vascular: No carotid bruits; legs too swollen to palpate pedal pulses. Cardiac: irreg irreg; no murmur gallup rub or click Lungs:  Diminished breath to auscultation bilaterally, no wheezing, rhonchi or rales  Abd: large,soft, nontender, no hepatomegaly + BS Ext: 4-5+ lower ext edema to mid thigh Lt arm improved with wrap. Due to cellulitis and water blisters. Musculoskeletal:  No deformities, BUE and BLE strength weak due to edema Skin:  warm and dry though lower ext are cooler Neuro:  Alert and oriented X 3 MAE follows commands no focal abnormalities noted Psych:  Normal affect    Relevant CV Studies: Echo 11/22/18 Study Conclusions   - Left ventricle: The cavity size was normal. Wall thickness was   normal. Systolic function was normal. The estimated ejection   fraction was in the range of 55% to 60%. - Mitral valve: There was mild regurgitation. - Left atrium: The atrium was moderately dilated. - Right atrium: The atrium was moderately dilated. - Atrial septum: No defect or patent foramen ovale was identified. - Tricuspid valve: There was moderate regurgitation directed   centrally. - Pulmonary arteries: Systolic pressure was mildly increased. PA   peak pressure: 40 mm Hg (S).   Laboratory Data:  High Sensitivity Troponin:  No results for input(s): TROPONINIHS in the last 720 hours.   Cardiac EnzymesNo results for input(s): TROPONINI in the last 168 hours. No results for input(s): TROPIPOC in the last 168 hours.  Chemistry Recent Labs  Lab 05/02/2019 1800 05/14/2019 1940 05/14/2019 2321 05/28/19 0340  NA 142 145 143 142  K 3.9 3.9 3.8 3.7  CL 112*  --   --  110  CO2 13*  --   --  13*  GLUCOSE 113*  --   --  131*  BUN 26*  --   --  30*  CREATININE 1.83*  --   --  1.96*  CALCIUM 8.9  --   --  9.1  GFRNONAA 28*  --   --  26*  GFRAA 32*  --   --  30*  ANIONGAP 17*  --   --  19*    Recent Labs  Lab 05/16/2019 1930 05/28/19 0340  PROT 7.4 7.9  ALBUMIN 3.0* 3.5  AST 34 46*  ALT 29 30  ALKPHOS 124 135*  BILITOT 1.2 1.1   Hematology Recent Labs  Lab 05/14/2019 1800 05/09/2019 1940 05/14/2019 2321 05/28/19 0340  WBC 10.6*  --   --  11.5*  RBC 3.19*  --   --  3.48*  HGB 7.9* 8.2* 8.8* 8.5*  HCT 27.8* 24.0* 26.0* 31.5*  MCV 87.1  --   --  90.5  MCH 24.8*  --   --  24.4*  MCHC 28.4*  --   --  27.0*  RDW 17.4*  --   --  17.8*  PLT 601*  --   --  675*   BNP Recent Labs  Lab 05/17/2019 1922  BNP 1,641.2*    DDimer No results for input(s): DDIMER in the last 168 hours.   Radiology/Studies:  Dg Chest Portable 1 View  Result Date: 05/23/2019 CLINICAL DATA:  Atrial Holt.  Leg edema EXAM: PORTABLE CHEST 1  VIEW COMPARISON:  05/09/2019 FINDINGS: Cardiac enlargement. Progression of small bilateral effusions and bibasilar atelectasis. Negative for edema. Endotracheal tube removed, NG tube removed, central line removed compared to the prior study. IMPRESSION: Cardiac enlargement with progression of small bilateral effusions and bibasilar atelectasis suggesting fluid overload. Electronically Signed   By: Franchot Gallo M.D.   On: 05/20/2019 19:28    Assessment and Plan:   1. A Holt with RVR, pt with permanent a Holt.and has been rate controlled on cartia 240 mg and anticoagulation on xarelto. Now on dilt drip most likely HR elevated due to acute blood loss anemia. And volume overload.  Dr Claiborne Billings to see 2. CHF, with hx of normal EF and pulmonary HTN.  Most likely  related to acute anemia and rt sided failure with cirrhosis  Just rec'd lasix 40 will increase to 80 mg BID Erin Holt is on aldactone 50 daily.  3. NASH with cirrhosis  Has rec'd paracentesis in Dec.   4. OSA with CPAP  5. Acute blood loss anemia vaginally will need OB workup in post menopausal woman.   6. Anticoagulation on xarelto now on hold.   7. Cellulitis of Lt arm. Per primary team      For questions or updates, please contact [facility] Please consult [email or web address] for contact info under     Signed, Cecilie Kicks, NP  05/28/2019 11:31 AM    Patient seen and examined. Agree with assessment and plan.  Erin Holt which is a very pleasant 68 year old patient who is followed by Dr. Percival Holt.  Erin Holt has a history of permanent atrial fibrillation and has been on Cardizem CD for rate control.  Erin Holt has been on anticoagulation therapy with Xarelto.  A recent echo Doppler study has shown normal systolic function with EF 55 to 60% with moderate biatrial enlargement, mild mitral valve regurgitation, moderate tricuspid regurgitation and PA pressure estimated 40 mm.  Erin Holt has a history of NASH cirrhosis and has required 2 paracenteses.  There also was a  history of remote CVA, artery occlusion and hypertension.  Erin Holt had recently been admitted with GI bleed and weight gain according to the patient from 203 to 239 pounds.  Recently Erin Holt has started to notice vaginal bleeding.  Erin Holt does not have a gynecologist.  Erin Holt was seen by her primary physician yesterday and her hemoglobin had dropped to 7.7 and A. Holt rate was significantly increased.  Hospitalization was recommended.  He denies any chest pain.  Erin Holt can continues to experience significant lateral lower extremity edema from the knee down and needs to sit upright for improved breathing.  BNP on admission was 1641.  Creatinine had risen to 1.96.  AST was mildly increased at 46 but otherwise LFTs were fairly normal.  Exam presently, Erin Holt is on IV Cardizem drip.  Her heart rate is approximately 105 bpm and irregularly irregular.  Blood pressure is 112/82.  Sclera were anicteric.  Since Erin Holt was sitting upright, it was hard to discern if there was any significant jugular venous distention or hepatojugular reflux.  Erin Holt had mildly decreased breath sounds at her bases consistent with her documented bilateral small pleural effusions.  Rhythm was irregular irregular.  There was a 1/6 systolic murmur in the lower right sternal border and apex.  Her abdomen was protuberant.  Erin Holt was sitting upright and I could not assess a positive fluid wave.  Erin Holt had at least 3-4+ lower extremity edema to at least her knee and slightly above.  There was negative Homans sign neurologically Erin Holt was grossly normal.  Erin Holt had normal affect and mood.  Her ECG presentation shows A. Holt at 145 with nonspecific ST changes.  Present, recommend an increased diuretic regimen and will increase furosemide initially to 80 mg twice a day.  Renal function will need to be followed closely as well as hemoglobin/hematocrit.  At present Xarelto dose is on hold.  However, when restarted if Erin Holt has creatinine clearance greater than 50 dose should be reduced to 15 mg  daily with her atrial fibrillation indication.  Her last CT scan of May 09, 2019 did demonstrate moderate volume ascites in the abdomen and pelvis.  Erin Holt had a cirrhotic hepatic morphology and had full effusion with bilateral lower lobe atelectasis.  At present, her A. Holt with RVR has improved with IV Cardizem drip.  Will initiate low-dose metoprolol tartrate at 25 mg twice a day and increase as needed depending upon blood pressure and heart rate response.  Will follow.   Troy Sine, MD, [facility] 05/28/2019 1:42 PM

## 2019-05-28 NOTE — Progress Notes (Signed)
PT asked about cpap and responded saying she has not used one for months, and does not care for one.

## 2019-05-29 ENCOUNTER — Inpatient Hospital Stay (HOSPITAL_COMMUNITY): Payer: Medicare Other

## 2019-05-29 ENCOUNTER — Ambulatory Visit: Payer: Medicare Other | Admitting: Adult Health

## 2019-05-29 DIAGNOSIS — R570 Cardiogenic shock: Secondary | ICD-10-CM

## 2019-05-29 DIAGNOSIS — I4891 Unspecified atrial fibrillation: Secondary | ICD-10-CM

## 2019-05-29 LAB — RENAL FUNCTION PANEL
Albumin: 2.6 g/dL — ABNORMAL LOW (ref 3.5–5.0)
BUN: 39 mg/dL — ABNORMAL HIGH (ref 8–23)
CO2: 7 mmol/L — ABNORMAL LOW (ref 22–32)
Calcium: 8.2 mg/dL — ABNORMAL LOW (ref 8.9–10.3)
Chloride: 108 mmol/L (ref 98–111)
Creatinine, Ser: 2.97 mg/dL — ABNORMAL HIGH (ref 0.44–1.00)
GFR calc Af Amer: 18 mL/min — ABNORMAL LOW (ref >60)
GFR calc non Af Amer: 16 mL/min — ABNORMAL LOW (ref >60)
Glucose, Bld: 38 mg/dL — CL (ref 70–99)
Phosphorus: 8.4 mg/dL — ABNORMAL HIGH (ref 2.5–4.6)
Potassium: 4.5 mmol/L (ref 3.5–5.1)
Sodium: 144 mmol/L (ref 135–145)

## 2019-05-29 LAB — CBC
HCT: 29.2 % — ABNORMAL LOW (ref 36.0–46.0)
Hemoglobin: 8.1 g/dL — ABNORMAL LOW (ref 12.0–15.0)
MCH: 25 pg — ABNORMAL LOW (ref 26.0–34.0)
MCHC: 27.7 g/dL — ABNORMAL LOW (ref 30.0–36.0)
MCV: 90.1 fL (ref 80.0–100.0)
Platelets: 709 10*3/uL — ABNORMAL HIGH (ref 150–400)
RBC: 3.24 MIL/uL — ABNORMAL LOW (ref 3.87–5.11)
RDW: 18.2 % — ABNORMAL HIGH (ref 11.5–15.5)
WBC: 17.5 10*3/uL — ABNORMAL HIGH (ref 4.0–10.5)
nRBC: 2.5 % — ABNORMAL HIGH (ref 0.0–0.2)

## 2019-05-29 LAB — BRAIN NATRIURETIC PEPTIDE: B Natriuretic Peptide: 1384.8 pg/mL — ABNORMAL HIGH (ref 0.0–100.0)

## 2019-05-29 LAB — URINALYSIS, ROUTINE W REFLEX MICROSCOPIC
Bilirubin Urine: NEGATIVE
Glucose, UA: NEGATIVE mg/dL
Hgb urine dipstick: NEGATIVE
Ketones, ur: NEGATIVE mg/dL
Leukocytes,Ua: NEGATIVE
Nitrite: NEGATIVE
Protein, ur: NEGATIVE mg/dL
Specific Gravity, Urine: 1.01 (ref 1.005–1.030)
pH: 5 (ref 5.0–8.0)

## 2019-05-29 LAB — TROPONIN I (HIGH SENSITIVITY)
Troponin I (High Sensitivity): >27000 ng/L (ref <18)
Troponin I (High Sensitivity): >27000 ng/L (ref <18)

## 2019-05-29 LAB — POCT I-STAT 7, (LYTES, BLD GAS, ICA,H+H)
Acid-base deficit: 24 mmol/L — ABNORMAL HIGH (ref 0.0–2.0)
Bicarbonate: 6.2 mmol/L — ABNORMAL LOW (ref 20.0–28.0)
Calcium, Ion: 1.06 mmol/L — ABNORMAL LOW (ref 1.15–1.40)
HCT: 25 % — ABNORMAL LOW (ref 36.0–46.0)
Hemoglobin: 8.5 g/dL — ABNORMAL LOW (ref 12.0–15.0)
O2 Saturation: 94 %
Patient temperature: 97
Potassium: 4.1 mmol/L (ref 3.5–5.1)
Sodium: 143 mmol/L (ref 135–145)
TCO2: 7 mmol/L — ABNORMAL LOW (ref 22–32)
pCO2 arterial: 26.1 mmHg — ABNORMAL LOW (ref 32.0–48.0)
pH, Arterial: 6.974 — CL (ref 7.350–7.450)
pO2, Arterial: 105 mmHg (ref 83.0–108.0)

## 2019-05-29 LAB — CBC WITH DIFFERENTIAL/PLATELET
Abs Immature Granulocytes: 0.13 10*3/uL — ABNORMAL HIGH (ref 0.00–0.07)
Basophils Absolute: 0 10*3/uL (ref 0.0–0.1)
Basophils Relative: 0 %
Eosinophils Absolute: 0 10*3/uL (ref 0.0–0.5)
Eosinophils Relative: 0 %
HCT: 28.3 % — ABNORMAL LOW (ref 36.0–46.0)
Hemoglobin: 7.9 g/dL — ABNORMAL LOW (ref 12.0–15.0)
Immature Granulocytes: 1 %
Lymphocytes Relative: 14 %
Lymphs Abs: 2 10*3/uL (ref 0.7–4.0)
MCH: 24.5 pg — ABNORMAL LOW (ref 26.0–34.0)
MCHC: 27.9 g/dL — ABNORMAL LOW (ref 30.0–36.0)
MCV: 87.6 fL (ref 80.0–100.0)
Monocytes Absolute: 0.9 10*3/uL (ref 0.1–1.0)
Monocytes Relative: 6 %
Neutro Abs: 11.4 10*3/uL — ABNORMAL HIGH (ref 1.7–7.7)
Neutrophils Relative %: 79 %
Platelets: 672 10*3/uL — ABNORMAL HIGH (ref 150–400)
RBC: 3.23 MIL/uL — ABNORMAL LOW (ref 3.87–5.11)
RDW: 18 % — ABNORMAL HIGH (ref 11.5–15.5)
WBC: 14.4 10*3/uL — ABNORMAL HIGH (ref 4.0–10.5)
nRBC: 2.2 % — ABNORMAL HIGH (ref 0.0–0.2)

## 2019-05-29 LAB — HEPATIC FUNCTION PANEL
ALT: 37 U/L (ref 0–44)
AST: 79 U/L — ABNORMAL HIGH (ref 15–41)
Albumin: 3.1 g/dL — ABNORMAL LOW (ref 3.5–5.0)
Alkaline Phosphatase: 133 U/L — ABNORMAL HIGH (ref 38–126)
Bilirubin, Direct: 0.5 mg/dL — ABNORMAL HIGH (ref 0.0–0.2)
Indirect Bilirubin: 0.9 mg/dL (ref 0.3–0.9)
Total Bilirubin: 1.4 mg/dL — ABNORMAL HIGH (ref 0.3–1.2)
Total Protein: 7.8 g/dL (ref 6.5–8.1)

## 2019-05-29 LAB — BLOOD GAS, ARTERIAL
Acid-base deficit: 23.3 mmol/L — ABNORMAL HIGH (ref 0.0–2.0)
Bicarbonate: 4.3 mmol/L — ABNORMAL LOW (ref 20.0–28.0)
Drawn by: 246101
FIO2: 100
O2 Saturation: 97.3 %
Patient temperature: 97.4
pH, Arterial: 7.171 — CL (ref 7.350–7.450)
pO2, Arterial: 150 mmHg — ABNORMAL HIGH (ref 83.0–108.0)

## 2019-05-29 LAB — APTT: aPTT: 35 seconds (ref 24–36)

## 2019-05-29 LAB — LACTIC ACID, PLASMA
Lactic Acid, Venous: 10.6 mmol/L (ref 0.5–1.9)
Lactic Acid, Venous: >11 mmol/L (ref 0.5–1.9)
Lactic Acid, Venous: >11 mmol/L (ref 0.5–1.9)

## 2019-05-29 LAB — BASIC METABOLIC PANEL
Anion gap: 16 — ABNORMAL HIGH (ref 5–15)
Anion gap: 22 — ABNORMAL HIGH (ref 5–15)
BUN: 34 mg/dL — ABNORMAL HIGH (ref 8–23)
BUN: 36 mg/dL — ABNORMAL HIGH (ref 8–23)
CO2: 11 mmol/L — ABNORMAL LOW (ref 22–32)
CO2: 12 mmol/L — ABNORMAL LOW (ref 22–32)
Calcium: 8.6 mg/dL — ABNORMAL LOW (ref 8.9–10.3)
Calcium: 8.8 mg/dL — ABNORMAL LOW (ref 8.9–10.3)
Chloride: 108 mmol/L (ref 98–111)
Chloride: 110 mmol/L (ref 98–111)
Creatinine, Ser: 2.14 mg/dL — ABNORMAL HIGH (ref 0.44–1.00)
Creatinine, Ser: 2.45 mg/dL — ABNORMAL HIGH (ref 0.44–1.00)
GFR calc Af Amer: 23 mL/min — ABNORMAL LOW (ref >60)
GFR calc Af Amer: 27 mL/min — ABNORMAL LOW (ref >60)
GFR calc non Af Amer: 20 mL/min — ABNORMAL LOW (ref >60)
GFR calc non Af Amer: 23 mL/min — ABNORMAL LOW (ref >60)
Glucose, Bld: 109 mg/dL — ABNORMAL HIGH (ref 70–99)
Glucose, Bld: 151 mg/dL — ABNORMAL HIGH (ref 70–99)
Potassium: 4 mmol/L (ref 3.5–5.1)
Potassium: 4.4 mmol/L (ref 3.5–5.1)
Sodium: 138 mmol/L (ref 135–145)
Sodium: 141 mmol/L (ref 135–145)

## 2019-05-29 LAB — COOXEMETRY PANEL
Carboxyhemoglobin: 0.2 % — ABNORMAL LOW (ref 0.5–1.5)
Methemoglobin: 0.7 % (ref 0.0–1.5)
O2 Saturation: 50.3 %
Total hemoglobin: 7.5 g/dL — ABNORMAL LOW (ref 12.0–16.0)

## 2019-05-29 LAB — GLUCOSE, CAPILLARY: Glucose-Capillary: 133 mg/dL — ABNORMAL HIGH (ref 70–99)

## 2019-05-29 LAB — SODIUM, URINE, RANDOM: Sodium, Ur: 87 mmol/L

## 2019-05-29 LAB — PROCALCITONIN: Procalcitonin: 0.49 ng/mL

## 2019-05-29 LAB — HEPARIN LEVEL (UNFRACTIONATED): Heparin Unfractionated: 1.58 IU/mL — ABNORMAL HIGH (ref 0.30–0.70)

## 2019-05-29 LAB — CREATININE, URINE, RANDOM: Creatinine, Urine: 88.62 mg/dL

## 2019-05-29 SURGERY — IABP INSERTION
Anesthesia: LOCAL

## 2019-05-29 MED ORDER — AMIODARONE HCL IN DEXTROSE 360-4.14 MG/200ML-% IV SOLN: 30.0000 mg/h | INTRAVENOUS | Status: DC

## 2019-05-29 MED ORDER — STERILE WATER FOR INJECTION IV SOLN
INTRAVENOUS | Status: DC
  Filled 2019-05-29 (×6): qty 1000

## 2019-05-29 MED ORDER — ASPIRIN 81 MG PO CHEW: 81.0000 mg | CHEWABLE_TABLET | Freq: Every day | ORAL | Status: DC

## 2019-05-29 MED ORDER — FUROSEMIDE 10 MG/ML IJ SOLN
80.0000 mg | Freq: Once | INTRAMUSCULAR | Status: AC
  Administered 2019-05-29: 80 mg via INTRAVENOUS
  Filled 2019-05-29: qty 8

## 2019-05-29 MED ORDER — FENTANYL CITRATE (PF) 100 MCG/2ML IJ SOLN: 25.0000 ug | INTRAMUSCULAR | Status: DC | PRN

## 2019-05-29 MED ORDER — GLYCOPYRROLATE 1 MG PO TABS
1.0000 mg | ORAL_TABLET | ORAL | Status: DC | PRN
  Filled 2019-05-29: qty 1

## 2019-05-29 MED ORDER — MORPHINE BOLUS VIA INFUSION
5.0000 mg | INTRAVENOUS | Status: DC | PRN
  Filled 2019-05-29: qty 5

## 2019-05-29 MED ORDER — MIDAZOLAM HCL 2 MG/2ML IJ SOLN: 1.0000 mg | INTRAMUSCULAR | Status: DC | PRN

## 2019-05-29 MED ORDER — POLYVINYL ALCOHOL 1.4 % OP SOLN
1.0000 [drp] | Freq: Four times a day (QID) | OPHTHALMIC | Status: DC | PRN
  Filled 2019-05-29: qty 15

## 2019-05-29 MED ORDER — PRISMASOL BGK 4/2.5 32-4-2.5 MEQ/L IV SOLN
INTRAVENOUS | Status: DC
  Filled 2019-05-29 (×6): qty 5000

## 2019-05-29 MED ORDER — STERILE WATER FOR INJECTION IV SOLN
INTRAVENOUS | Status: DC
  Filled 2019-05-29: qty 150

## 2019-05-29 MED ORDER — ACETAMINOPHEN 650 MG RE SUPP: 650.0000 mg | Freq: Four times a day (QID) | RECTAL | Status: DC | PRN

## 2019-05-29 MED ORDER — EPINEPHRINE PF 1 MG/ML IJ SOLN
0.5000 ug/min | INTRAVENOUS | Status: DC
  Filled 2019-05-29: qty 4

## 2019-05-29 MED ORDER — HEPARIN SODIUM (PORCINE) 1000 UNIT/ML DIALYSIS
1000.0000 [IU] | INTRAMUSCULAR | Status: DC | PRN
  Filled 2019-05-29: qty 6

## 2019-05-29 MED ORDER — SODIUM CHLORIDE 0.9 % IV SOLN: INTRAVENOUS | Status: DC

## 2019-05-29 MED ORDER — ALPRAZOLAM 0.25 MG PO TABS
0.2500 mg | ORAL_TABLET | Freq: Three times a day (TID) | ORAL | Status: DC | PRN
  Administered 2019-05-29: 09:00:00 0.25 mg via ORAL
  Filled 2019-05-29: qty 1

## 2019-05-29 MED ORDER — DIPHENHYDRAMINE HCL 50 MG/ML IJ SOLN: 25.0000 mg | INTRAMUSCULAR | Status: DC | PRN

## 2019-05-29 MED ORDER — ETOMIDATE 2 MG/ML IV SOLN
20.0000 mg | Freq: Once | INTRAVENOUS | Status: AC
  Administered 2019-05-29: 20 mg via INTRAVENOUS

## 2019-05-29 MED ORDER — DOBUTAMINE IN D5W 4-5 MG/ML-% IV SOLN
INTRAVENOUS | Status: AC
  Administered 2019-05-29: 2.5 ug/kg/min via INTRAVENOUS
  Filled 2019-05-29: qty 250

## 2019-05-29 MED ORDER — FENTANYL CITRATE (PF) 100 MCG/2ML IJ SOLN
INTRAMUSCULAR | Status: AC
  Filled 2019-05-29: qty 2

## 2019-05-29 MED ORDER — GLYCOPYRROLATE 0.2 MG/ML IJ SOLN: 0.2000 mg | INTRAMUSCULAR | Status: DC | PRN

## 2019-05-29 MED ORDER — HYDROCORTISONE NA SUCCINATE PF 100 MG IJ SOLR: 100.0000 mg | Freq: Three times a day (TID) | INTRAMUSCULAR | Status: DC

## 2019-05-29 MED ORDER — EPINEPHRINE 1 MG/10ML IJ SOSY
PREFILLED_SYRINGE | INTRAMUSCULAR | Status: AC
  Filled 2019-05-29: qty 10

## 2019-05-29 MED ORDER — STERILE WATER FOR INJECTION IV SOLN
INTRAVENOUS | Status: DC
  Filled 2019-05-29 (×2): qty 150

## 2019-05-29 MED ORDER — NOREPINEPHRINE 4 MG/250ML-% IV SOLN
0.0000 ug/min | INTRAVENOUS | Status: DC
  Administered 2019-05-29: 4 mg via INTRAVENOUS
  Filled 2019-05-29 (×2): qty 250

## 2019-05-29 MED ORDER — ACETAMINOPHEN 325 MG PO TABS: 650.0000 mg | ORAL_TABLET | Freq: Four times a day (QID) | ORAL | Status: DC | PRN

## 2019-05-29 MED ORDER — HEPARIN (PORCINE) 25000 UT/250ML-% IV SOLN
1200.0000 [IU]/h | INTRAVENOUS | Status: DC
  Filled 2019-05-29: qty 250

## 2019-05-29 MED ORDER — MIDAZOLAM HCL 2 MG/2ML IJ SOLN
2.0000 mg | Freq: Once | INTRAMUSCULAR | Status: AC
  Administered 2019-05-29: 2 mg via INTRAVENOUS

## 2019-05-29 MED ORDER — SODIUM BICARBONATE 8.4 % IV SOLN
INTRAVENOUS | Status: AC
  Filled 2019-05-29: qty 100

## 2019-05-29 MED ORDER — HEPARIN SODIUM (PORCINE) 1000 UNIT/ML IJ SOLN
3000.0000 [IU] | Freq: Once | INTRAMUSCULAR | Status: DC
  Filled 2019-05-29: qty 3

## 2019-05-29 MED ORDER — CHLORHEXIDINE GLUCONATE CLOTH 2 % EX PADS: 6.0000 | MEDICATED_PAD | Freq: Every day | CUTANEOUS | Status: DC

## 2019-05-29 MED ORDER — ATORVASTATIN CALCIUM 80 MG PO TABS: 80.0000 mg | ORAL_TABLET | Freq: Every evening | ORAL | Status: DC

## 2019-05-29 MED ORDER — NOREPINEPHRINE 16 MG/250ML-% IV SOLN: 0.0000 ug/min | INTRAVENOUS | Status: DC

## 2019-05-29 MED ORDER — SODIUM BICARBONATE 8.4 % IV SOLN
INTRAVENOUS | Status: AC
  Filled 2019-05-29: qty 50

## 2019-05-29 MED ORDER — ROCURONIUM BROMIDE 50 MG/5ML IV SOLN
60.0000 mg | Freq: Once | INTRAVENOUS | Status: AC
  Administered 2019-05-29: 60 mg via INTRAVENOUS
  Filled 2019-05-29: qty 6

## 2019-05-29 MED ORDER — HEPARIN (PORCINE) 2000 UNITS/L FOR CRRT
INTRAVENOUS_CENTRAL | Status: DC | PRN
  Filled 2019-05-29: qty 1000

## 2019-05-29 MED ORDER — DEXTROSE 5 % IV SOLN: INTRAVENOUS | Status: DC

## 2019-05-29 MED ORDER — VASOPRESSIN 20 UNIT/ML IV SOLN
0.0300 [IU]/min | INTRAVENOUS | Status: DC
  Administered 2019-05-29: 0.03 [IU]/min via INTRAVENOUS
  Filled 2019-05-29: qty 2

## 2019-05-29 MED ORDER — STERILE WATER FOR INJECTION IV SOLN
INTRAVENOUS | Status: DC
  Filled 2019-05-29 (×3): qty 1000

## 2019-05-29 MED ORDER — FUROSEMIDE 10 MG/ML IJ SOLN
40.0000 mg | Freq: Once | INTRAMUSCULAR | Status: AC
  Administered 2019-05-29: 13:00:00 40 mg via INTRAVENOUS
  Filled 2019-05-29: qty 4

## 2019-05-29 MED ORDER — MORPHINE 100MG IN NS 100ML (1MG/ML) PREMIX INFUSION: 0.0000 mg/h | INTRAVENOUS | Status: DC

## 2019-05-29 MED ORDER — AMIODARONE LOAD VIA INFUSION
150.0000 mg | Freq: Once | INTRAVENOUS | Status: AC
  Administered 2019-05-29: 150 mg via INTRAVENOUS
  Filled 2019-05-29: qty 83.34

## 2019-05-29 MED ORDER — DOBUTAMINE IN D5W 4-5 MG/ML-% IV SOLN
2.5000 ug/kg/min | INTRAVENOUS | Status: DC
  Administered 2019-05-29: 2.5 ug/kg/min via INTRAVENOUS

## 2019-05-29 MED ORDER — AMIODARONE HCL IN DEXTROSE 360-4.14 MG/200ML-% IV SOLN
60.0000 mg/h | INTRAVENOUS | Status: DC
  Administered 2019-05-29: 60 mg/h via INTRAVENOUS
  Filled 2019-05-29: qty 400

## 2019-05-29 MED ORDER — NOREPINEPHRINE 4 MG/250ML-% IV SOLN
INTRAVENOUS | Status: AC
  Administered 2019-05-29: 4 mg via INTRAVENOUS
  Filled 2019-05-29: qty 250

## 2019-05-29 MED ORDER — FENTANYL CITRATE (PF) 100 MCG/2ML IJ SOLN: 50.0000 ug | Freq: Once | INTRAMUSCULAR | Status: AC

## 2019-05-29 MED ORDER — HEPARIN (PORCINE) 25000 UT/250ML-% IV SOLN: 1200.0000 [IU]/h | INTRAVENOUS | Status: DC

## 2019-05-29 MED ORDER — MORPHINE SULFATE (PF) 2 MG/ML IV SOLN
2.0000 mg | INTRAVENOUS | Status: DC | PRN
  Administered 2019-05-29: 2 mg via INTRAVENOUS
  Filled 2019-05-29: qty 1

## 2019-05-29 MED ORDER — MIDAZOLAM HCL 2 MG/2ML IJ SOLN
INTRAMUSCULAR | Status: AC
  Administered 2019-05-29: 2 mg via INTRAVENOUS
  Filled 2019-05-29: qty 4

## 2019-05-31 LAB — CULTURE, BLOOD (ROUTINE X 2)

## 2019-06-01 NOTE — Progress Notes (Addendum)
Pt complained she was having trouble breathing and felt she needed to void but was unable to. Pt appeared uncomfortable and was diaphoretic. Called RR nurse to bedside. RT and Hospitalist also called to bedside to assess pt. See NP's note for further details. Will continue to monitor pt closely.

## 2019-06-01 NOTE — Procedures (Signed)
Hemodialysis Catheter Insertion Procedure Note Erin Holt [id number] February 02, 1951  Procedure: Insertion of Hemodialysis Catheter Indications: Dialysis Access   Procedure Details Consent: Unable to obtain consent because of emergent medical necessity. Time Out: Verified patient identification, verified procedure, site/side was marked, verified correct patient position, special equipment/implants available, medications/allergies/relevent history reviewed, required imaging and test results available.  Performed  Maximum sterile technique was used including antiseptics, cap, gloves, gown, hand hygiene, mask and sheet. Skin prep: Chlorhexidine; local anesthetic administered Triple lumen hemodialysis catheter was inserted into the right IJ and also the left femoral using the Seldinger technique.With each attempt we were unable to thread guidewire.   Evaluation Blood flow good Complications: Complications of unable to thread guidewire after multiple attempts. Aborted  Patient did tolerate procedure well. Chest X-ray ordered to verify placement.  CXR: pending.   Clementeen Graham May 30, 2019

## 2019-06-01 NOTE — Procedures (Signed)
Central Venous Catheter Insertion Procedure Note [name] [id number] 1951/09/12  Procedure: Insertion of Central Venous Catheter Indications: Drug and/or fluid administration  Procedure Details Consent: Unable to obtain consent because of emergent medical necessity. Time Out: Verified patient identification, verified procedure, site/side was marked, verified correct patient position, special equipment/implants available, medications/allergies/relevent history reviewed, required imaging and test results available.  Performed  Maximum sterile technique was used including antiseptics, cap, gloves, gown, hand hygiene, mask and sheet. Skin prep: Chlorhexidine; local anesthetic administered A antimicrobial bonded/coated triple lumen catheter was placed in the left femoral vein due to emergent situation using the Seldinger technique.  Evaluation Blood flow good Complications: No apparent complications Patient did tolerate procedure well. Chest X-ray ordered to verify placement.  CXR: not needed.  Erin Holt Jun 23, 2019, 11:19 AM   Erick Colace ACNP-BC [place] Pager # [phone] OR # [phone] if no answer

## 2019-06-01 NOTE — Progress Notes (Signed)
Patient was transferred to [facility], at this time MD is attempting to place a femoral line. At this time IV team is not needed.

## 2019-06-01 NOTE — CV Procedure (Signed)
Central Venous Catheter Insertion Procedure Note Erin Holt [id number] [birth year]    Procedure: Insertion of Central Venous Catheter Indications: Drug and/or fluid administration   Procedure Details Consent: Risks of procedure as well as the alternatives and risks of each were explained to the (patient/caregiver).  Consent for procedure obtained. Time Out: Verified patient identification, verified procedure, site/side was marked, verified correct patient position, special equipment/implants available, medications/allergies/relevent history reviewed, required imaging and test results available.  Performed   Maximum sterile technique was used including antiseptics, cap, gloves, gown, hand hygiene, mask and sheet. Skin prep: Chlorhexidine; local anesthetic administered A antimicrobial bonded/coated triple lumen catheter was placed in the left internal jugular vein using the Seldinger technique and u/s guidance. On the initial attempt to place the line the wire kinked and I was unable to feed the catheter over the wire so I removed the wire and catheter and held pressure for 5 minutes. I then restuck and was able to place the line successfully   Evaluation Blood flow good Complications: No apparent complications Patient did tolerate procedure well. Chest X-ray ordered to verify placement.  CXR: Good placement. No PTX.  Hold heparin for 4 hours.    Glori Bickers, MD  3:20 PM

## 2019-06-01 NOTE — Progress Notes (Addendum)
   2019/06/21 0741  Vitals  BP (!) 141/107  MAP (mmHg) 118  BP Location Right Arm  ECG Heart Rate (!) 115  MEWS Score  MEWS RR 2  MEWS Pulse 2  MEWS Systolic 0  MEWS LOC 0  MEWS Temp 0  MEWS Score 4  MEWS Score Color Red   MD, RN, and Rapid response at bedside. Cardizem gtt will be restarted once IV team gets a working iv site in place. Also fluids for elevated LA in morning labs. MD verbal for central line if difficulties arise. I will continue to monitor the patient closely.   Saddie Benders RN BSN

## 2019-06-01 NOTE — Progress Notes (Signed)
Critical ABG results.    pH: 6.97 CO2: 26 PO2:105 Bicarb: 6.2  Paged results to MD, waiting to hear back.

## 2019-06-01 NOTE — Progress Notes (Signed)
IV team still with no succuss with IV access and continuing to working with the patient. IV team is reaching out to a Art therapist for assistance. Called Rapid RN and MD back to bedside as patient continues to have an increased work of breathing requiring non-rebreather mask. This Designer, multimedia not able to get accurate pulse ox readings related to the patient being cold and clammy. RT to bedside with Bipap if needed. MD ordered ABG and contacting CCM to see patient. RR RN contacted 2H charge RN to prep for patient arrival.  Tx to 2H with RN, RT, RR RN and CCM MD at bedside during transfer process. Patient received by team on [place]. Advised to please call with any questions.   Saddie Benders RN BSN

## 2019-06-01 NOTE — Consult Note (Signed)
Reason for Consult: Acute kidney injury, cardiogenic shock, profound metabolic acidosis Referring Physician: Marshell Garfinkel MD (CCM)  HPI:  68 year old African-American woman with past medical history significant for chronic atrial fibrillation on anticoagulation with Xarelto, pulmonary hypertension, history of CVA, obstructive sleep apnea on CPAP, carotid artery disease, hypertension and chronic kidney disease stage III at baseline (creatinine around 1.1-1.2).  Echocardiogram showed that she has severely reduced systolic function with an EF of 20 to 25% with severe tricuspid regurgitation, moderate mitral valve regurgitation and small PFO.  Renal ultrasound shows relative left renal atrophy with bilateral renal cysts and without evidence of hydronephrosis.  She presented to the emergency room for evaluation of abnormal uterine bleeding (postmenopausal) with symptomatic anemia/atrial fibrillation with rapid ventricular response associated with exertional dyspnea and increasing abdominal swelling/pedal edema.  She was just recently discharged from the hospital 7 days ago after a 2-week admission for left forearm cellulitis complicated by AKI and GI bleed with endoscopy showing small gastric ulcer and diverticulosis without bleeding.  She clinically decompensated overnight developing respiratory distress with evidence of cardiogenic shock for which she was transferred to the ICU and intubated/pressors/amiodarone started.  Consequent labs are significant for severe metabolic acidosis.  Past Medical History:  Diagnosis Date  . Arthritis    "left knee" (10/09/2012)  . Atrial fibrillation ([place])   . Atrial fibrillation ([place]) 05/2019  . Carotid artery occlusion    a. Carotid US (12/10/13):  R 60-79%; L 1-39% - f/u 6 mos  . Glaucoma (increased eye pressure)    "both eyes" (10/09/2012)  . Gout   . Hx of echocardiogram    a. Echo (09/2012):  Mild LVH, EF 55-65%, Gr 1 DD, MAC, mild MR, mild to mod LAE, mild  RVE, PASP 44 mmHg  . Hypertension   . Stroke [facility]) 10/09/2012    Past Surgical History:  Procedure Laterality Date  . BREAST BIOPSY  1980's   "left; it wasn't anything" (10/09/2012)  . COLONOSCOPY WITH PROPOFOL N/A 12/12/2016   Procedure: COLONOSCOPY WITH PROPOFOL;  Surgeon: Garlan Fair, MD;  Location: [facility];  Service: Endoscopy;  Laterality: N/A;  . COLONOSCOPY WITH PROPOFOL N/A 05/09/2019   Procedure: COLONOSCOPY WITH PROPOFOL;  Surgeon: Laurence Spates, MD;  Location: [place];  Service: Endoscopy;  Laterality: N/A;  . ESOPHAGOGASTRODUODENOSCOPY (EGD) WITH PROPOFOL N/A 05/08/2019   Procedure: ESOPHAGOGASTRODUODENOSCOPY (EGD) WITH PROPOFOL;  Surgeon: Ronnette Juniper, MD;  Location: [place];  Service: Gastroenterology;  Laterality: N/A;  . HEMOSTASIS CLIP PLACEMENT  05/08/2019   Procedure: HEMOSTASIS CLIP PLACEMENT;  Surgeon: Ronnette Juniper, MD;  Location: [place];  Service: Gastroenterology;;  . IR PARACENTESIS  09/25/2018  . IR PARACENTESIS  10/16/2018  . KNEE ARTHROSCOPY W/ DEBRIDEMENT  2011   "left; for arthritis" (10/09/2012)    Family History  Problem Relation Age of Onset  . Heart failure Mother 74       Died  . Diabetes Mother   . Diabetes Father   . CAD Father 86    Social History:  reports that she has never smoked. She has never used smokeless tobacco. She reports that she does not drink alcohol or use drugs.  Allergies: No Known Allergies  Medications:  Scheduled: . allopurinol  100 mg Oral QPM  . atorvastatin  40 mg Oral QPM  . Chlorhexidine Gluconate Cloth  6 each Topical Daily  . EPINEPHrine      . EPINEPHrine      . fentaNYL (SUBLIMAZE) injection  50 mcg Intravenous Once  .  furosemide  80 mg Intravenous Once  . latanoprost  1 drop Both Eyes QHS  . pantoprazole  40 mg Oral QPM  . sodium bicarbonate      . sodium bicarbonate      . sodium chloride flush  3 mL Intravenous Q12H    BMP Latest Ref Rng & Units 2019/06/24 24-Jun-2019 05/28/2019   Glucose 70 - 99 mg/dL - 109(H) 151(H)  BUN 8 - 23 mg/dL - 36(H) 34(H)  Creatinine 0.44 - 1.00 mg/dL - 2.45(H) 2.14(H)  Sodium 135 - 145 mmol/L 143 141 138  Potassium 3.5 - 5.1 mmol/L 4.1 4.4 4.0  Chloride 98 - 111 mmol/L - 108 110  CO2 22 - 32 mmol/L - 11(L) 12(L)  Calcium 8.9 - 10.3 mg/dL - 8.8(L) 8.6(L)   CBC Latest Ref Rng & Units 24-Jun-2019 2019-06-24 June 24, 2019  WBC 4.0 - 10.5 K/uL - 17.5(H) 14.4(H)  Hemoglobin 12.0 - 15.0 g/dL 8.5(L) 8.1(L) 7.9(L)  Hematocrit 36.0 - 46.0 % 25.0(L) 29.2(L) 28.3(L)  Platelets 150 - 400 K/uL - 709(H) 672(H)   ABG    Component Value Date/Time   PHART 6.974 (LL) 2019-06-24 1300   PCO2ART 26.1 (L) June 24, 2019 1300   PO2ART 105.0 06/24/19 1300   HCO3 6.2 (L) 2019-06-24 1300   TCO2 7 (L) 2019/06/24 1300   ACIDBASEDEF 24.0 (H) Jun 24, 2019 1300   O2SAT 50.3 06-24-2019 1330     Dg Chest Port 1 View  Result Date: 06/24/2019 CLINICAL DATA:  Left IJ placement EXAM: PORTABLE CHEST 1 VIEW COMPARISON:  None. FINDINGS: Endotracheal tube with the tip 2.2 cm above the carina. Left jugular central venous catheter with the tip projecting over the SVC. Nasogastric tube coursing below the diaphragm. Small bilateral pleural effusions. Mild bilateral interstitial thickening. Stable cardiomegaly. No acute osseous abnormality. IMPRESSION: Left jugular central venous catheter with the tip projecting over the SVC. Cardiomegaly with bilateral small pleural effusions. Electronically Signed   By: Kathreen Devoid   On: 06-24-19 13:36   Dg Chest Port 1 View  Result Date: 2019-06-24 CLINICAL DATA:  Respiratory failure EXAM: PORTABLE CHEST 1 VIEW COMPARISON:  05/28/2019 FINDINGS: Interval placement of endotracheal tube with distal tip in the right mainstem bronchus. Interval placement of enteric tube with distal tip in the proximal stomach and side hole in the distal esophagus. Cardiomegaly, unchanged. Small to moderate bilateral pleural effusions. No new focal airspace  consolidation. No pneumothorax. IMPRESSION: 1. Endotracheal tube terminates within the right mainstem bronchus. Recommend retraction approximately 5 cm. 2. Enteric tube side hole within the distal esophagus. Recommend advancement 7-10 cm. 3. Cardiomegaly and small to moderate bilateral pleural effusions. These results will be called to the ordering clinician or representative by the Radiologist Assistant, and communication documented in the PACS or zVision Dashboard. Electronically Signed   By: Davina Poke M.D.   On: 06-24-19 10:43   Dg Chest Port 1 View  Result Date: 05/28/2019 CLINICAL DATA:  Acute respiratory distress EXAM: PORTABLE CHEST 1 VIEW COMPARISON:  05/08/2019 FINDINGS: Cardiomegaly.  No frank interstitial edema. Moderate right pleural effusion, mildly increased. Small left pleural effusion, unchanged.  No pneumothorax. IMPRESSION: Moderate right and small left pleural effusions, mildly progressive. No frank interstitial edema. Electronically Signed   By: Julian Hy M.D.   On: 05/28/2019 23:44   Dg Chest Portable 1 View  Result Date: 05/06/2019 CLINICAL DATA:  Atrial fib.  Leg edema EXAM: PORTABLE CHEST 1 VIEW COMPARISON:  05/09/2019 FINDINGS: Cardiac enlargement. Progression of small bilateral effusions and bibasilar atelectasis. Negative  for edema. Endotracheal tube removed, NG tube removed, central line removed compared to the prior study. IMPRESSION: Cardiac enlargement with progression of small bilateral effusions and bibasilar atelectasis suggesting fluid overload. Electronically Signed   By: Franchot Gallo M.D.   On: 05/31/2019 19:28   [place] Ascites (abdomen Limited)  Result Date: 05/28/2019 CLINICAL DATA:  Bloating for 1 week EXAM: LIMITED ABDOMEN ULTRASOUND FOR ASCITES TECHNIQUE: Limited ultrasound survey for ascites was performed in all four abdominal quadrants. COMPARISON:  CT May 09, 2019 FINDINGS: Small amount of abdominopelvic ascites is seen. IMPRESSION: Small amount  of ascites. Electronically Signed   By: Prudencio Pair M.D.   On: 05/28/2019 18:18   US Pelvic Complete With Transvaginal  Result Date: 05/28/2019 CLINICAL DATA:  Vaginal bleeding since Friday EXAM: TRANSABDOMINAL AND TRANSVAGINAL ULTRASOUND OF PELVIS TECHNIQUE: Both transabdominal and transvaginal ultrasound examinations of the pelvis were performed. Transabdominal technique was performed for global imaging of the pelvis including uterus, ovaries, adnexal regions, and pelvic cul-de-sac. It was necessary to proceed with endovaginal exam following the transabdominal exam to visualize the . COMPARISON:  CT May 09, 2019 FINDINGS: Uterus Measurements: 10.2 x 6.0 x 7.4 cm = volume: 237 mL. The uterus appears heterogeneous and prominent. The uterus is anteverted. There are 3 posterior heterogeneous fundal uterine fibroids seen. The largest measures 3.3 x 3.0 x 4.0 cm with areas of calcification. Endometrium Thickness: 2.3 cm. Heterogeneous and prominent. No focal mass seen. Right ovary Nonvisualized.  No adnexal mass seen. Left ovary Measurements: 2.4 x 1.5 x 1.7 cm. = volume: 3 mL. Normal appearance/no adnexal mass. Other findings Small amount of free fluid seen within the cul-de-sac. IMPRESSION: 1. Fibroid uterus 2. Endometrial thickness is considered abnormal for a post-menopausal female. Endometrial sampling should be considered to exclude carcinoma. 3. Nonvisualized right ovary 4. Normal left ovary 5. small amount of free fluid in the cul-de-sac. Electronically Signed   By: Prudencio Pair M.D.   On: 05/28/2019 18:16    Review of Systems  Unable to perform ROS: Intubated   Blood pressure (!) 130/56, pulse (!) 147, temperature (!) 97.4 F (36.3 C), temperature source Oral, resp. rate (!) 28, height _0  (1.626 m), weight 102.5 kg, SpO2 (!) 63 %. Physical Exam  Nursing note and vitals reviewed. Constitutional: She appears well-developed and well-nourished. No distress.  HENT:  Head: Normocephalic and  atraumatic.  Intubated, sedated  Eyes: Pupils are equal, round, and reactive to light.  Pale conjunctiva  Neck: Neck supple. JVD present.  Cardiovascular: Normal heart sounds. Exam reveals no friction rub.  Irregular tachycardia  Respiratory: She has no wheezes. She has no rales.  Ventilated breath sounds  GI: Soft. There is no abdominal tenderness. There is no rebound.  Musculoskeletal:        General: Edema present.     Comments: 3+ bilateral lower extremity edema  Neurological:  Sedated  Skin: Skin is warm and dry. There is pallor.    Assessment/Plan: 1.  Acute kidney injury: Hemodynamically mediated by cardiogenic shock.  Hypotension/poor renal perfusion prohibitive to diuretic therapy for which patient will be started on CRRT for regulation of multiple metabolic abnormalities and volume unloading. 2.  Cardiogenic shock: With decompensated congestive heart failure likely from anemia as well as atrial fibrillation with rapid with clear response.  Supportive management with pressors and inotropic support. 3.  Anemia: Suspected to be from abnormal uterine bleeding, hemoglobin/hematocrit currently appears stable without overt blood loss.  Transfusion trigger would be hemoglobin <7. 4.  Ventilator dependent respiratory failure: Intubated and will use CRRT for volume unloading/correction of metabolic acidosis. 5.  Atrial fibrillation with rapid ventricular response: On amiodarone drip, monitor with ongoing pressor support. 6.  Anion gap metabolic acidosis: Secondary to shock/hypoperfusion with lactic acidosis.  Manage with CRRT.  Zynia Wojtowicz K. 06/02/19, 1:52 PM

## 2019-06-01 NOTE — Progress Notes (Signed)
NP called by RRRN because pt in respiratory distress. Tachypneic, but O2 sat normal. ABG done. CXR ordered. CBC and BMP ordered. NP to bedside. S: pt states her breathing/SOB is better than it was 30 mins to one hour ago. She denies CP or abdominal pain. No cough. She states she is just tired because she hasn't had any rest/sleep since coming to the hospital.  O: appears chronically ill but not toxic. BP 106/69, 92/81. HR 90s Afib. RR upper 20s to low 30s, but decreasing as NP stays in room. O2 sat 100%. She has her eyes closed but immediately responds to name calling. Her speech is fluent without noted increased SOB. She is alert, oriented and answers questions appropriately. Card: irreg irreg. Lungs: CTA upper lobes. Diminished breath sounds in lower lobes, right greater than left. Abd soft. LEs with 2-3+ edema. Skin is dry.  A/P: 1. Acute respiratory distress-she came in in overload but there is no pulmonary edema on her CXR. However, she does have a small left and moderate right pleural effusion. She is on Lasix 80 bid and last had this about 6 hours ago. Her respiratory status has improved while at bedside and she says she feels better. Hold additional Lasix for now and continue bid dosing. Will watch for now.  2. Alkalosis-PCO2 below readable value. Bicarb on BMP yesterday was 13. R/p BMP pending. Give one amp bicarb.  3. Afib with RVR-on Cardizem drip. BP soft and rate has been adjusted on drip and HR is still controlled. Is likely contributing to her pleural effusion and on admission overload.  4. Increased creatinine-baseline appears to be 1.07-1.10. Now 1.96. Very small amt of UO since Lasix 80. Creatinine is elevated. ? Obstruction vs urinary retention. Difficult to assess bladder scan due to obesity. Will place foley for accurate intake and output given Lasix dosing. Follow creatinine.  5. Vaginal bleeding-rechecked CBC given anemia and respiratory status. Her Hgb is stable at 8 (has been 7.9-8.8  since admit). Follow Hgb.  6. Hx OSA-refused CPAP. Asked RT to offer again, would likely improve her respiratory status and help her to rest.  [facility], NP [facility] Performed by: Gardiner Barefoot Total critical care time: start 2340. End 0025 for total of 45 minutes Critical care time was exclusive of separately billable procedures and treating other patients. Critical care was necessary to treat or prevent imminent or life-threatening deterioration. Critical care was time spent personally by me on the following activities: development of treatment plan with patient and/or surrogate as well as nursing, discussions with consultants, evaluation of patient's response to treatment, examination of patient, obtaining history from patient or surrogate, ordering and performing treatments and interventions, ordering and review of laboratory studies, ordering and review of radiographic studies, pulse oximetry and re-evaluation of patient's condition.

## 2019-06-01 NOTE — Progress Notes (Signed)
Stopped Cardizem gtt 0025 for bp 80/66. Notified Hospitalist. Will continue to monitor.

## 2019-06-01 NOTE — Progress Notes (Addendum)
PROGRESS NOTE    SHANTAI TIEDEMAN  ONG:[id number] DOB: 1951-02-17 DOA: 05/24/2019 PCP: Leeroy Cha, MD   Brief Narrative:  Maurene Capes a 68 y.o.femalewith medical history significant of a.fibon Xarelto, oSA on c.PAP,tricuspid regurgitation and pulmonary hypertension ascites, CKD,renal artery occlusion,stroke, hypertension, small gastric erosion presented withlow hemoglobin counts as was instructed to present to emergency department by her primary care provider. Pt reports a week ago she developed vaginal bleeding feels like a regular menstrualperiod (postmenopausal), has been changing about 4 pads a dayorso.Patient states she is very sure it is vaginal in nature, on anticoagulation. Patient endorsing some dyspnea, extremity swelling and abdominal swelling which is typical for her fluid overload. States her last thoracentesis was in December.  Of note, about 3 weeks ago, patient had melena and hematochezia and was admitted to the hospital for hemorrhagic shock. Pt had a prolonged hospital stay from 8th-22thJuly requiring admission to ICU requiring intubation, seen by gastroenterology Dr. Therisa Doyne had a endoscopy and colonoscopy done showing small gastric erosion,colonoscopy showed diverticuli but no active bleeding. Hemoglobin at the time of discharge on21July was 8.2, now 7.7, she was discharged on oral Protonix. Hospitalization was further complicated by left forearm cellulitis and localized abscess, was seen by orthopedics and ID and eventually was discharged home on Augmentin.    In the ED, patient was found to be in A. fib with RVR, with possible volume overload.  Cardiology was consulted.  Hospitalist called to admit patient  Consultants:   PCCM consulted 06/17/2019  Cardiology  Procedures:   None  Antimicrobials:   None   Subjective: Received signout from night NP Kirky about her significantly elevated lactic acid of more than 10.  IV fluids  with bicarb was ordered immediately.  Patient was seen by myself at 7:50 AM.  Patient was sitting at the bedside commode.  She was slightly tachypneic however she was alert and oriented although looked a little bit lethargic.  Despite of being tachypneic, she did not complain of any shortness of breath and stated she stated that she feels like she is having anxiety and panicking.  Denied any previous history of anxiety or depression.  Denied any other complaint such as dizziness, chest pain, headache, diaphoresis or any other complaint.  I was paged again at 9:01 AM from the nurse.  I was informed over the phone that patient has deteriorated in over an hour and RRT was called and she is significantly more tachypneic.  I arrived at the bedside at around 9:05 AM.  Patient was on nonrebreather.  RRT was at the bedside and was trying to get a line in her.  RT was getting ABG.  She was clearly using accessory chest and abdominal muscles to breathe and looked tired.  PCCM was consulted around 9:20 AM and I discussed with Dr. Vaughan Browner who came to the bedside within few minutes.  Patient is in the process of being transferred to ICU and will likely need intubation and central line since several attempts of getting a peripheral line and EEG have been failed.  Objective: Vitals:   2019/06/17 0600 06/17/19 0739 2019/06/17 0741 17-Jun-2019 0801  BP: 102/67 (!) 131/104 (!) 141/107   Pulse:    (!) 118  Resp: (!) 29     Temp:      TempSrc:      SpO2:    100%  Weight:        Intake/Output Summary (Last 24 hours) at 2019/06/17 0900 Last data filed at 05/28/2019  2130 Gross per 24 hour  Intake 527.75 ml  Output 150 ml  Net 377.75 ml   Filed Weights   06/11/19 0530  Weight: 102.5 kg    Examination:  General exam: Tachypneic, lethargic and using accessory muscles Respiratory system: Diminished breath sounds with scant crackles, tachypneic, using accessory muscles Cardiovascular system: S1 & S2 heard, irregularly  irregular rate and rhythm. No JVD, murmurs, rubs, gallops or clicks.  +2 pitting edema bilateral lower extremities. Gastrointestinal system: Abdomen is nondistended, soft and nontender. No organomegaly or masses felt. Normal bowel sounds heard. Central nervous system: Lethargic but oriented. No focal neurological deficits. Extremities: Symmetric 5 x 5 power. Skin: No rashes, lesions or ulcers Psychiatry: Unable to assess due to lethargy and acute respiratory distress.   Data Reviewed: I have personally reviewed following labs and imaging studies  CBC: Recent Labs  Lab 05/18/2019 1800 05/19/2019 1940 05/06/2019 2321 05/28/19 0340 05/28/19 2336 06-11-19 0548  WBC 10.6*  --   --  11.5* 12.9* 14.4*  NEUTROABS 8.7*  --   --   --   --  11.4*  HGB 7.9* 8.2* 8.8* 8.5* 8.0* 7.9*  HCT 27.8* 24.0* 26.0* 31.5* 27.7* 28.3*  MCV 87.1  --   --  90.5 85.5 87.6  PLT 601*  --   --  675* 506* 263*   Basic Metabolic Panel: Recent Labs  Lab 05/31/2019 1800 05/16/2019 1940 05/01/2019 2321 05/28/19 0340 05/28/19 2342 06-11-19 0548  NA 142 145 143 142 138 141  K 3.9 3.9 3.8 3.7 4.0 4.4  CL 112*  --   --  110 110 108  CO2 13*  --   --  13* 12* 11*  GLUCOSE 113*  --   --  131* 151* 109*  BUN 26*  --   --  30* 34* 36*  CREATININE 1.83*  --   --  1.96* 2.14* 2.45*  CALCIUM 8.9  --   --  9.1 8.6* 8.8*  MG  --   --   --  1.9  --   --   PHOS  --   --   --  3.5  --   --    GFR: Estimated Creatinine Clearance: 25.6 mL/min (A) (by C-G formula based on SCr of 2.45 mg/dL (H)). Liver Function Tests: Recent Labs  Lab 05/17/2019 1930 05/28/19 0340  AST 34 46*  ALT 29 30  ALKPHOS 124 135*  BILITOT 1.2 1.1  PROT 7.4 7.9  ALBUMIN 3.0* 3.5   No results for input(s): LIPASE, AMYLASE in the last 168 hours. No results for input(s): AMMONIA in the last 168 hours. Coagulation Profile: Recent Labs  Lab 05/26/2019 2309  INR 3.5*   Cardiac Enzymes: No results for input(s): CKTOTAL, CKMB, CKMBINDEX, TROPONINI in  the last 168 hours. BNP (last 3 results) No results for input(s): PROBNP in the last 8760 hours. HbA1C: No results for input(s): HGBA1C in the last 72 hours. CBG: Recent Labs  Lab 05/28/19 2342  GLUCAP 133*   Lipid Profile: No results for input(s): CHOL, HDL, LDLCALC, TRIG, CHOLHDL, LDLDIRECT in the last 72 hours. Thyroid Function Tests: Recent Labs    05/28/19 0340  TSH 3.617   Anemia Panel: No results for input(s): VITAMINB12, FOLATE, FERRITIN, TIBC, IRON, RETICCTPCT in the last 72 hours. Sepsis Labs: Recent Labs  Lab 05/07/2019 2309 05/28/19 0806 June 11, 2019 0548  LATICACIDVEN 5.9* 3.2* 10.6*    Recent Results (from the past 240 hour(s))  SARS Coronavirus 2 (CEPHEID - Performed  in [place] lab), Hosp Order     Status: None   Collection Time: 05/10/2019  9:37 PM   Specimen: Nasopharyngeal Swab  Result Value Ref Range Status   SARS Coronavirus 2 NEGATIVE NEGATIVE Final    Comment: (NOTE) If result is NEGATIVE SARS-CoV-2 target nucleic acids are NOT DETECTED. The SARS-CoV-2 RNA is generally detectable in upper and lower  respiratory specimens during the acute phase of infection. The lowest  concentration of SARS-CoV-2 viral copies this assay can detect is 250  copies / mL. A negative result does not preclude SARS-CoV-2 infection  and should not be used as the sole basis for treatment or other  patient management decisions.  A negative result may occur with  improper specimen collection / handling, submission of specimen other  than nasopharyngeal swab, presence of viral mutation(s) within the  areas targeted by this assay, and inadequate number of viral copies  (<250 copies / mL). A negative result must be combined with clinical  observations, patient history, and epidemiological information. If result is POSITIVE SARS-CoV-2 target nucleic acids are DETECTED. The SARS-CoV-2 RNA is generally detectable in upper and lower  respiratory specimens dur ing the  acute phase of infection.  Positive  results are indicative of active infection with SARS-CoV-2.  Clinical  correlation with patient history and other diagnostic information is  necessary to determine patient infection status.  Positive results do  not rule out bacterial infection or co-infection with other viruses. If result is PRESUMPTIVE POSTIVE SARS-CoV-2 nucleic acids MAY BE PRESENT.   A presumptive positive result was obtained on the submitted specimen  and confirmed on repeat testing.  While 2019 novel coronavirus  (SARS-CoV-2) nucleic acids may be present in the submitted sample  additional confirmatory testing may be necessary for epidemiological  and / or clinical management purposes  to differentiate between  SARS-CoV-2 and other Sarbecovirus currently known to infect humans.  If clinically indicated additional testing with an alternate test  methodology [phone]) is advised. The SARS-CoV-2 RNA is generally  detectable in upper and lower respiratory sp ecimens during the acute  phase of infection. The expected result is Negative. Fact Sheet for Patients:  StrictlyIdeas.no Fact Sheet for Healthcare Providers: BankingDealers.co.za This test is not yet approved or cleared by the [place] FDA and has been authorized for detection and/or diagnosis of SARS-CoV-2 by FDA under an Emergency Use Authorization (EUA).  This EUA will remain in effect (meaning this test can be used) for the duration of the COVID-19 declaration under Section 564(b)(1) of the Act, 21 U.S.C. section 360bbb-3(b)(1), unless the authorization is terminated or revoked sooner. Performed at [facility], [place] [address], [place], [place] [address]       Radiology Studies: Dg Chest Port 1 View  Result Date: 05/28/2019 CLINICAL DATA:  Acute respiratory distress EXAM: PORTABLE CHEST 1 VIEW COMPARISON:  05/28/2019 FINDINGS: Cardiomegaly.  No frank  interstitial edema. Moderate right pleural effusion, mildly increased. Small left pleural effusion, unchanged.  No pneumothorax. IMPRESSION: Moderate right and small left pleural effusions, mildly progressive. No frank interstitial edema. Electronically Signed   By: Julian Hy M.D.   On: 05/28/2019 23:44   Dg Chest Portable 1 View  Result Date: 05/30/2019 CLINICAL DATA:  Atrial fib.  Leg edema EXAM: PORTABLE CHEST 1 VIEW COMPARISON:  05/09/2019 FINDINGS: Cardiac enlargement. Progression of small bilateral effusions and bibasilar atelectasis. Negative for edema. Endotracheal tube removed, NG tube removed, central line removed compared to the prior study.  IMPRESSION: Cardiac enlargement with progression of small bilateral effusions and bibasilar atelectasis suggesting fluid overload. Electronically Signed   By: Franchot Gallo M.D.   On: 05/16/2019 19:28   [place] Ascites (abdomen Limited)  Result Date: 05/28/2019 CLINICAL DATA:  Bloating for 1 week EXAM: LIMITED ABDOMEN ULTRASOUND FOR ASCITES TECHNIQUE: Limited ultrasound survey for ascites was performed in all four abdominal quadrants. COMPARISON:  CT May 09, 2019 FINDINGS: Small amount of abdominopelvic ascites is seen. IMPRESSION: Small amount of ascites. Electronically Signed   By: Prudencio Pair M.D.   On: 05/28/2019 18:18   US Pelvic Complete With Transvaginal  Result Date: 05/28/2019 CLINICAL DATA:  Vaginal bleeding since Friday EXAM: TRANSABDOMINAL AND TRANSVAGINAL ULTRASOUND OF PELVIS TECHNIQUE: Both transabdominal and transvaginal ultrasound examinations of the pelvis were performed. Transabdominal technique was performed for global imaging of the pelvis including uterus, ovaries, adnexal regions, and pelvic cul-de-sac. It was necessary to proceed with endovaginal exam following the transabdominal exam to visualize the . COMPARISON:  CT May 09, 2019 FINDINGS: Uterus Measurements: 10.2 x 6.0 x 7.4 cm = volume: 237 mL. The uterus appears  heterogeneous and prominent. The uterus is anteverted. There are 3 posterior heterogeneous fundal uterine fibroids seen. The largest measures 3.3 x 3.0 x 4.0 cm with areas of calcification. Endometrium Thickness: 2.3 cm. Heterogeneous and prominent. No focal mass seen. Right ovary Nonvisualized.  No adnexal mass seen. Left ovary Measurements: 2.4 x 1.5 x 1.7 cm. = volume: 3 mL. Normal appearance/no adnexal mass. Other findings Small amount of free fluid seen within the cul-de-sac. IMPRESSION: 1. Fibroid uterus 2. Endometrial thickness is considered abnormal for a post-menopausal female. Endometrial sampling should be considered to exclude carcinoma. 3. Nonvisualized right ovary 4. Normal left ovary 5. small amount of free fluid in the cul-de-sac. Electronically Signed   By: Prudencio Pair M.D.   On: 05/28/2019 18:16    Scheduled Meds:  allopurinol  100 mg Oral QPM   amoxicillin-clavulanate  1 tablet Oral Q12H   atorvastatin  40 mg Oral QPM   furosemide  40 mg Intravenous Once   latanoprost  1 drop Both Eyes QHS   metoprolol tartrate  25 mg Oral BID   pantoprazole  40 mg Oral QPM   sodium chloride flush  3 mL Intravenous Q12H   Continuous Infusions:  sodium chloride     diltiazem (CARDIZEM) infusion Stopped (Jun 20, 2019 0025)   sterile water 1,000 mL with sodium bicarbonate 75 mEq infusion       LOS: 1 day   Assessment & Plan:   Principal Problem:   Atrial fibrillation with RVR (HCC) Active Problems:   Obesity   Hypertension, essential   Sleep apnea   Pulmonary HTN (HCC)   Other ascites   AKI (acute kidney injury) ([place])   Liver cirrhosis (HCC)   Acute blood loss anemia   Acute on chronic right heart failure (HCC)   Metabolic acidosis   Acute hypoxic respiratory failure/lactic acidosis/acute combined systolic and diastolic congestive heart failure/pulmonary hypertension: Patient in acute respiratory distress with tachypnea and using accessory muscles.  PCCM consulted  urgently.  Patient is being transferred to ICU for possible intubation.  Will likely be under PCCM care if gets intubated.  Defer management to them.  Permanent A. fib with RVR: She was on Cardizem yesterday.  Cardiology managing.  Her Cardizem was stopped due to low blood pressure.  She needs IV line.  She will likely need to be back on Cardizem.  Will defer to cardiology  further management.  AKI on CKD stage III: Creatinine bumped to 2.4.  Likely secondary to shock.  Treat underlying cause.  Acute on chronic blood loss anemia: Hemoglobin dropped a little bit but is still no indication of transfusion.  Current hemoglobin 7.9.  Vaginal bleeding/thick endometrium of the uterus: Consulted OB/GYN prior to patient deteriorating.  NASH with cirrhosis with ascites: Very mild ascites on the ultrasound abdomen that was done yesterday.  OSA: Uses CPAP at night.  Cellulitis of left arm: On Augmentin.    DVT prophylaxis: Anticoagulation on hold due to vaginal bleeding. Code Status: Full code Family Communication: Called and spoke to patient's brother Mr. Lawanna Kobus and informed him about grave prognosis and high chance of her being intubated soon.  He verbalized understanding.  He had no questions. Disposition Plan: To be determined   Time spent: 56 minutes including 20 minutes of critical care.   Darliss Cheney, MD [facility] Pager [phone]  If 7PM-7AM, please contact night-coverage [email or web address] Password [email or web address] 2019-06-12, 9:00 AM

## 2019-06-01 NOTE — Progress Notes (Deleted)
. Cardiology Office Note   Date:  June 05, 2019   ID:  Erin Holt, DOB 1951-03-10, MRN [id number]  PCP:  Leeroy Cha, MD  Cardiologist:   No chief complaint on file.    History of Present Illness: Erin Holt is a 68 y.o. female who presents for ***    Past Medical History:  Diagnosis Date  . Arthritis    "left knee" (10/09/2012)  . Atrial fibrillation ([place])   . Atrial fibrillation ([place]) 05/2019  . Carotid artery occlusion    a. Carotid US (12/10/13):  R 60-79%; L 1-39% - f/u 6 mos  . Glaucoma (increased eye pressure)    "both eyes" (10/09/2012)  . Gout   . Hx of echocardiogram    a. Echo (09/2012):  Mild LVH, EF 55-65%, Gr 1 DD, MAC, mild MR, mild to mod LAE, mild RVE, PASP 44 mmHg  . Hypertension   . Stroke [facility]) 10/09/2012    Past Surgical History:  Procedure Laterality Date  . BREAST BIOPSY  1980's   "left; it wasn't anything" (10/09/2012)  . COLONOSCOPY WITH PROPOFOL N/A 12/12/2016   Procedure: COLONOSCOPY WITH PROPOFOL;  Surgeon: Garlan Fair, MD;  Location: [facility];  Service: Endoscopy;  Laterality: N/A;  . COLONOSCOPY WITH PROPOFOL N/A 05/09/2019   Procedure: COLONOSCOPY WITH PROPOFOL;  Surgeon: Laurence Spates, MD;  Location: [place];  Service: Endoscopy;  Laterality: N/A;  . ESOPHAGOGASTRODUODENOSCOPY (EGD) WITH PROPOFOL N/A 05/08/2019   Procedure: ESOPHAGOGASTRODUODENOSCOPY (EGD) WITH PROPOFOL;  Surgeon: Ronnette Juniper, MD;  Location: [place];  Service: Gastroenterology;  Laterality: N/A;  . HEMOSTASIS CLIP PLACEMENT  05/08/2019   Procedure: HEMOSTASIS CLIP PLACEMENT;  Surgeon: Ronnette Juniper, MD;  Location: [place];  Service: Gastroenterology;;  . IR PARACENTESIS  09/25/2018  . IR PARACENTESIS  10/16/2018  . KNEE ARTHROSCOPY W/ DEBRIDEMENT  2011   "left; for arthritis" (10/09/2012)     No current facility-administered medications for this visit.    No current outpatient medications on file.   Facility-Administered  Medications Ordered in Other Visits  Medication Dose Route Frequency Provider Last Rate Last Dose  . 0.9 %  sodium chloride infusion  250 mL Intravenous PRN Toy Baker, MD      . acetaminophen (TYLENOL) tablet 650 mg  650 mg Oral Q6H PRN Doutova, Anastassia, MD       Or  . acetaminophen (TYLENOL) suppository 650 mg  650 mg Rectal Q6H PRN Doutova, Anastassia, MD      . allopurinol (ZYLOPRIM) tablet 100 mg  100 mg Oral QPM Doutova, Anastassia, MD   100 mg at 05/28/19 1845  . amoxicillin-clavulanate (AUGMENTIN) 875-125 MG per tablet 1 tablet  1 tablet Oral Q12H Toy Baker, MD   1 tablet at 05/28/19 2128  . atorvastatin (LIPITOR) tablet 40 mg  40 mg Oral QPM Doutova, Anastassia, MD   40 mg at 05/28/19 1845  . diltiazem (CARDIZEM) 100 mg in dextrose 5% 176m (1 mg/mL) infusion  5-15 mg/hr Intravenous Continuous Doutova, Anastassia, MD   Stopped at 02020/08/050025  . furosemide (LASIX) injection 40 mg  40 mg Intravenous Once ICecilie KicksR, NP      . furosemide (LASIX) injection 80 mg  80 mg Intravenous BID IIsaiah Serge NP   80 mg at 05/28/19 1846  . HYDROcodone-acetaminophen (NORCO/VICODIN) 5-325 MG per tablet 1-2 tablet  1-2 tablet Oral Q4H PRN Doutova, Anastassia, MD      . lactulose (CHRONULAC) 10 GM/15ML solution 10 g  10 g Oral Daily  PRN Toy Baker, MD      . latanoprost (XALATAN) 0.005 % ophthalmic solution 1 drop  1 drop Both Eyes QHS Doutova, Anastassia, MD   1 drop at 05/28/19 2156  . metoprolol tartrate (LOPRESSOR) tablet 25 mg  25 mg Oral BID Isaiah Serge, NP   25 mg at 05/28/19 2128  . ondansetron (ZOFRAN) tablet 4 mg  4 mg Oral Q6H PRN Doutova, Anastassia, MD       Or  . ondansetron (ZOFRAN) injection 4 mg  4 mg Intravenous Q6H PRN Doutova, Anastassia, MD      . pantoprazole (PROTONIX) EC tablet 40 mg  40 mg Oral QPM Doutova, Anastassia, MD   40 mg at 05/28/19 1845  . sodium chloride flush (NS) 0.9 % injection 3 mL  3 mL Intravenous Q12H Doutova,  Anastassia, MD   3 mL at 05/28/19 0915  . sodium chloride flush (NS) 0.9 % injection 3 mL  3 mL Intravenous PRN Toy Baker, MD        Allergies:   Patient has no known allergies.    Social History:  The patient  reports that she has never smoked. She has never used smokeless tobacco. She reports that she does not drink alcohol or use drugs.   Family History:  The patient's family history includes CAD (age of onset: 23) in her father; Diabetes in her father and mother; Heart failure (age of onset: 16) in her mother.    ROS: All other systems are reviewed and negative. Unless otherwise mentioned in H&P    PHYSICAL EXAM: VS:  There were no vitals taken for this visit. , BMI There is no height or weight on file to calculate BMI. GEN: Well nourished, well developed, in no acute distress HEENT: normal Neck: no JVD, carotid bruits, or masses Cardiac: ***RRR; no murmurs, rubs, or gallops,no edema  Respiratory:  Clear to auscultation bilaterally, normal work of breathing GI: soft, nontender, nondistended, + BS MS: no deformity or atrophy Skin: warm and dry, no rash Neuro:  Strength and sensation are intact Psych: euthymic mood, full affect   EKG:  EKG {ACTION; IS/IS HYI:[id number]} ordered today. The ekg ordered today demonstrates ***   Recent Labs: 05/12/2019: B Natriuretic Peptide 1,641.2 05/28/2019: ALT 30; Magnesium 1.9; TSH 3.617 06/01/19: BUN 36; Creatinine, Ser 2.45; Hemoglobin 7.9; Platelets 672; Potassium 4.4; Sodium 141    Lipid Panel    Component Value Date/Time   CHOL 114 10/17/2016 0957   TRIG 89 10/17/2016 0957   HDL 49 (L) 10/17/2016 0957   CHOLHDL 2.3 10/17/2016 0957   VLDL 18 10/17/2016 0957   LDLCALC 47 10/17/2016 0957      Wt Readings from Last 3 Encounters:  05/22/19 235 lb 8 oz (106.8 kg)  01/31/19 191 lb (86.6 kg)  12/02/18 186 lb 15.2 oz (84.8 kg)      Other studies Reviewed: Additional studies/ records that were reviewed today include:  ***. Review of the above records demonstrates: ***   ASSESSMENT AND PLAN:  1.  ***   Current medicines are reviewed at length with the patient today.    Labs/ tests ordered today include: *** Phill Myron. [place] Pugh, ANP, AACC   Jun 01, 2019 7:07 AM    [facility] [place] [address] [phone] Fax [phone]

## 2019-06-01 NOTE — Progress Notes (Signed)
RT NOTE: Patient deceased upon RT arrival. RT removed ETT and ventilator.

## 2019-06-01 NOTE — Significant Event (Addendum)
Rapid Response Event Note  Overview: Follow Up  Initial Focused Assessment: [facility] When I arrived, she was on the [facility] - alert/oriented, mild WOB coupled with mild tachypnea. I reviewed her chart, saw her ABG and Lactic Acid results. TRH MD came in the room right after I had arrived. Once repositioned her oxygen saturations were 100% on 6L [place]. SBP in the 140s, HR 100-120 (in AF- has been). Able to talk in full sentences but seems overall fatigued. Skin cool to touch. Anxious.   MD ordered that Diltiazem be restarted and Sodium Bicarb Infusion as well. Patient did not have IV access, VAST RN coming to establish line. I was called away to see another patient and told the nurse that I would come back to see patient as soon I could.   Interventions: - None-   Plan of Care:  -- PIV to be placed by VAST RN, Primary will start Diltiazem and Bicarb drips. MD ordered Xanax for anxiety  Event Summary:  Start Time 0740 End Time 0800   Erin Holt R

## 2019-06-01 NOTE — Consult Note (Signed)
   [facility] CM Inpatient Consult   2019-05-31  Erin Holt 1951-09-14 [id number]  Patient was recently outreached by a [facility] Management Coordinator.  However, the patient felt she had no follow up needs per notes.  Our community based plan of care had focused on disease management and community resource support.    Patient has transferred to ICU.  Primary Care Provider:  Leeroy Cha, MD  Plan:  Follow for progress and needs.  .  For additional questions or referrals please contact:  Natividad Brood, RN BSN [facility] Liaison  [phone] business mobile phone Toll free office [phone]  Fax number: [phone] [email or web address]

## 2019-06-01 NOTE — Progress Notes (Signed)
CRITICAL VALUE ALERT  Critical Value:  Lactic 10.6  Date & Time Notied:  06/10/19 0650  Provider Notified: Hospitalists  Orders Received/Actions taken:

## 2019-06-01 NOTE — Progress Notes (Addendum)
ANTICOAGULATION CONSULT NOTE - Initial Consult  Pharmacy Consult for Afib  Indication: history of atrial fibrillation  No Known Allergies  Patient Measurements: Weight: 226 lb (102.5 kg) Heparin Dosing Weight: 78.6 kg    Vital Signs: Temp: 97.4 F (36.3 C) (07/29 0530) Temp Source: Oral (07/29 0530) BP: 141/107 (07/29 0741) Pulse Rate: 118 (07/29 0801)  Labs: Recent Labs    05/26/2019 2309  05/28/19 0340 05/28/19 2336 05/28/19 2342 2019-06-15 0548  HGB  --    < > 8.5* 8.0*  --  7.9*  HCT  --    < > 31.5* 27.7*  --  28.3*  PLT  --   --  675* 506*  --  672*  LABPROT 34.6*  --   --   --   --   --   INR 3.5*  --   --   --   --   --   CREATININE  --   --  1.96*  --  2.14* 2.45*   < > = values in this interval not displayed.    Estimated Creatinine Clearance: 25.6 mL/min (A) (by C-G formula based on SCr of 2.45 mg/dL (H)).   Medical History: Past Medical History:  Diagnosis Date   Arthritis    "left knee" (10/09/2012)   Atrial fibrillation (HCC)    Atrial fibrillation ([place]) 05/2019   Carotid artery occlusion    a. Carotid US (12/10/13):  R 60-79%; L 1-39% - f/u 6 mos   Glaucoma (increased eye pressure)    "both eyes" (10/09/2012)   Gout    Hx of echocardiogram    a. Echo (09/2012):  Mild LVH, EF 55-65%, Gr 1 DD, MAC, mild MR, mild to mod LAE, mild RVE, PASP 44 mmHg   Hypertension    Stroke ([place]) 10/09/2012    Medications:  Scheduled:   allopurinol  100 mg Oral QPM   amiodarone  150 mg Intravenous Once   atorvastatin  40 mg Oral QPM   Chlorhexidine Gluconate Cloth  6 each Topical Daily   EPINEPHrine       EPINEPHrine       fentaNYL (SUBLIMAZE) injection  50 mcg Intravenous Once   furosemide  40 mg Intravenous Once   latanoprost  1 drop Both Eyes QHS   pantoprazole  40 mg Oral QPM   sodium bicarbonate       sodium chloride flush  3 mL Intravenous Q12H   Infusions:   sodium chloride 10 mL/hr at 2019-06-15 1000   amiodarone     Followed by   amiodarone     norepinephrine (LEVOPHED) Adult infusion     sterile water 1,000 mL with sodium bicarbonate 75 mEq infusion 150 mL/hr at Jun 15, 2019 1000   vasopressin (PITRESSIN) infusion - *FOR SHOCK*      Assessment: 68 yo female presented on 05/16/2019 with low hemoglobin (8.2>>7.6) and vaginal bleeding over previous week. Recent admission from 05/08/2019-05/22/2019 for hemorrhagic shock, required Kcentra.   On Xarelto PTA for Afib, currently being held. Last dose reported 05/26/2019 at 0900. Pharmacy consulted to dose heparin for Afib while holding home Xarelto. Hgb 7.9. Aneuric.   Checking baseline heparin level and aPTT to assess correlation to determine further therapeutic monitoring.   Goal of Therapy:  Heparin level 0.3-0.5 (due to history/recent bleeding) APTT: 66-85 s Monitor platelets by anticoagulation protocol: Yes   Plan:  Obtain baseline heparin level and aPTT  Start heparin infusion at 1200 units/hr at 1700 (4 hrs after central line placement)  Check heparin level and aPTT in 8 hours due to renal function at 0100 on 7/30 Monitor heparin level, aPTT, CBC, and S/S of bleeding daily    Cristela Felt, PharmD PGY1 Pharmacy Resident [employer]: [phone]  06-12-2019,11:04 AM

## 2019-06-01 NOTE — Progress Notes (Signed)
Called the patient's brother, Brittnie Lewey. Discussed the patient's current status and treatment plan. All questions and concerns addressed.   Ina Homes, MD IMTS PGY3  Pager: [phone]

## 2019-06-01 NOTE — Progress Notes (Signed)
RT NOTE: RT pulled back ETT from 22cm to 20cm per MD order due to CXR. RT will continue to monitor.

## 2019-06-01 NOTE — Progress Notes (Signed)
RT NOTE: RT pulled back ETT from 24cm to 22cm per MD order due to CXR. RT attempted aline x2 with doppler. RN and MD aware. RT will continue to monitor.

## 2019-06-01 NOTE — Progress Notes (Addendum)
Rapid response event, B arms assessed with ultrasound. Pt has non-compressible cephalic vein L upper arm. R upper arm veins small and too deep for PIV placement. Recommend central line.

## 2019-06-01 NOTE — Significant Event (Addendum)
Rapid Response Event Note  Overview: Clinical Deterioration - Shock   Initial Focused Assessment: I was on way back to 6E when [facility] called me, per nurses, patient's respiratory status has declined and she was in respiratory distress. I called RTs and asked that they bring a BIPAP up to 6E. Upon arrival, Erin Holt had deteriorated since when I saw her this morning, skin was very cold to touch on all extremities, poor capillary refill, in acute respiratory distress, RR was in the upper 30s, lung sounds - coarse crackles in the RT side, diminished throughout. + SOB/+ increased WOB/ use of accessory muscles, she was quite anxious, we were having a hard time keeping her NRB on because she was not tolerating it. Nurses were unable to get a oxygen saturations to read, SBP in the 150s, HR 110- AF, and VAST RN was trying to access the patient for PIV. Patient showing signs of shock. Has not had UOP since 2100 last night.   Interventions: -- STAT Page to MD by nurses - I expressed that if MD did not return page in 5 minutes that I would initiate a Code Blue for resources. [facility] called back - was informed that patient had deteriorated and needs to transfer to ICU/requested PCCM consult. [facility] MD came to bedside. Decision made to transfer patient to ICU. -- STAT ABG - 7.17/CO2- below reported range/PO2 - 150/ABD - 23/HCO3 - 4  -- No PIV access obtained -- VAST RN made multiple attempts for access including trying for EJ access.  Duanne Moron PO given  -- PCCM MD came to bedside, we emergently transferred the patient to ICU. Prior to leaving 6E, I called 2H, asked the charge nurse to prepare for emergent intubation/line placement/resuscitation. I also called the ED and asked that IO drill/supplies be sent to [facility] for emergent access as well. SBP had dropped to 80s on arrival to [facility].   Plan of Care: -- Transferred emergently to 2H07  Event Summary:  Call Time 0853 Arrival Time 0855  Arrival Time 2H 0957 End Time  [place], [place]

## 2019-06-01 NOTE — Progress Notes (Signed)
RT responded to rapid response to access PT. BG was drawn. But no further respiratory intervention was needed. I asked PT again regarding wearing cpap to help her rest PT still refusing.

## 2019-06-01 NOTE — Consult Note (Addendum)
Advanced Heart Failure Team Consult Note   Primary Physician: Leeroy Cha, MD PCP-Cardiologist:  Minus Breeding, MD  Reason for Consultation: Heart Failure   HPI:    Erin Holt is seen today for evaluation of heart failure  at the request of Dr Claiborne Billings.  Erin Holt is 68 year old with a history of chronic a fib, OSA CPAP, pulmonary HNT, CKD, CVA, renal artery occulusion, recent GI bleed, Cirrhosis, and MR.  She has had 2 paracentesis.   Admitted July 8th with left arm cellulitis. Hospital course complicated by AKI, AKI, and GI bleed. Treated with antibiotics. GI consulted and she underwent EGD/colonoscopy with an unclear source of bleeding. Discharged on 20 mg lasix daily.   Yesterday she was admitted with A fib RVR and symptomatic anemia. Having vaginal bleeding, post menopausal. EKG in the ED showed A fib RVR so diltiazem drip was started. Cardiology consulted with recommendations to repeat ECHO, increase lasix to 80 mg twice a day, and to add metoprolol tartrate. Poor urine output.   Over night she declined and developed respiratory distress. CCM/Rapid Response was contacted. Tachypenic with stable O2 sats. CXR was negative for edema so lasix was held. Creatinine was up to 1.96. Diltiazem was stopped due to hypotension. Lactic Acid 10.6. ECHO was repeated and showed reduced EF 20-25% from previous 55-60% in January of this year. Transferred to ICU for intubation and shock management. Amiodarone was started. Started on norepi for ongoing hypotension. HS Troponin obtained -->27,000 and lactic acid 11.   06/27/2019: Blood CX pending.    Echo 05/2019 EF 20-25% RV mildly reduced, LA dilated, RA severely dilated. Interatrial septal shunting with small PFO.   Review of Systems: [y] = yes, _0  = no Patient is encephalopathic and or intubated. Therefore history has been obtained from chart review.     General: Weight gain _1 ; Weight loss _2 ; Anorexia _3 ; Fatigue _4 ; Fever [  ]; Chills _5 ; Weakness _6    Cardiac: Chest pain/pressure _7 ; Resting SOB _8 ; Exertional SOB _9 ; Orthopnea _10 ; Pedal Edema _11 ; Palpitations _12 ; Syncope _13 ; Presyncope _14 ; Paroxysmal nocturnal dyspnea_15    Pulmonary: Cough _16 ; Wheezing_17 ; Hemoptysis_18 ; Sputum _19 ; Snoring _20    GI: Vomiting_21 ; Dysphagia_22 ; Melena_23 ; Hematochezia _24 ; Heartburn_25 ; Abdominal pain _26 ; Constipation _27 ; Diarrhea _28 ; BRBPR _29    GU: Hematuria_30 ; Dysuria _31 ; Nocturia_32    Vascular: Pain in legs with walking _33 ; Pain in feet with lying flat _34 ; Non-healing sores _35 ; Stroke _36 ; TIA _37 ; Slurred speech _38 ;   Neuro: Headaches_39 ; Vertigo_40 ; Seizures_41 ; Paresthesias_42 ;Blurred vision _43 ; Diplopia _44 ; Vision changes _45    Ortho/Skin: Arthritis _46 ; Joint pain _47 ; Muscle pain _48 ; Joint swelling _49 ; Back Pain _50 ; Rash _51    Psych: Depression_52 ; Anxiety_53    Heme: Bleeding problems _54 ; Clotting disorders _55 ; Anemia _56    Endocrine: Diabetes _57 ; Thyroid dysfunction_58   Home Medications Prior to Admission medications   Medication Sig Start Date End Date Taking? Authorizing Provider  allopurinol (ZYLOPRIM) 100 MG tablet Take 100 mg by mouth every evening.   Yes [provider]  amoxicillin-clavulanate (AUGMENTIN) 875-125 MG tablet Take 1 tablet by mouth every 12 (twelve) hours for 7 days. 05/22/19 2019/06/27 Yes Kayleen Memos,  DO  atorvastatin (LIPITOR) 40 MG tablet Take 40 mg by mouth every evening.  05/06/15  Yes [provider]  Brinzolamide-Brimonidine (SIMBRINZA) 1-0.2 % SUSP Place 1 drop into both eyes at bedtime.   Yes [provider]  diltiazem (CARTIA XT) 240 MG 24 hr capsule Take 240 mg by mouth every evening.    Yes [provider]  furosemide (LASIX) 20 MG tablet Take 1 tablet (20 mg total) by mouth daily. 05/22/19  Yes Irene Pap N, DO  lactulose (CHRONULAC) 10 GM/15ML solution Take 10 g by mouth daily as needed for mild constipation.   11/06/18  Yes [provider]  latanoprost (XALATAN) 0.005 % ophthalmic solution Place 1 drop into both eyes at bedtime.   Yes [provider]  pantoprazole (PROTONIX) 40 MG tablet Take 1 tablet (40 mg total) by mouth daily. Patient taking differently: Take 40 mg by mouth every evening.  05/23/19  Yes Kayleen Memos, DO  spironolactone (ALDACTONE) 50 MG tablet Take 1 tablet (50 mg total) by mouth daily. Patient taking differently: Take 50 mg by mouth every evening.  05/23/19  Yes Hall, Carole N, DO  XARELTO 20 MG TABS tablet TAKE 1 TABLET (20 MG TOTAL) BY MOUTH DAILY WITH SUPPER. Patient taking differently: Take 20 mg by mouth daily.  05/23/19  Yes Minus Breeding, MD    Past Medical History: Past Medical History:  Diagnosis Date   Arthritis    "left knee" (10/09/2012)   Atrial fibrillation [facility])    Atrial fibrillation ([place]) 05/2019   Carotid artery occlusion    a. Carotid US (12/10/13):  R 60-79%; L 1-39% - f/u 6 mos   Glaucoma (increased eye pressure)    "both eyes" (10/09/2012)   Gout    Hx of echocardiogram    a. Echo (09/2012):  Mild LVH, EF 55-65%, Gr 1 DD, MAC, mild MR, mild to mod LAE, mild RVE, PASP 44 mmHg   Hypertension    Stroke ([place]) 10/09/2012    Past Surgical History: Past Surgical History:  Procedure Laterality Date   BREAST BIOPSY  1980's   "left; it wasn't anything" (10/09/2012)   COLONOSCOPY WITH PROPOFOL N/A 12/12/2016   Procedure: COLONOSCOPY WITH PROPOFOL;  Surgeon: Garlan Fair, MD;  Location: [facility] ENDOSCOPY;  Service: Endoscopy;  Laterality: N/A;   COLONOSCOPY WITH PROPOFOL N/A 05/09/2019   Procedure: COLONOSCOPY WITH PROPOFOL;  Surgeon: Laurence Spates, MD;  Location: [place];  Service: Endoscopy;  Laterality: N/A;   ESOPHAGOGASTRODUODENOSCOPY (EGD) WITH PROPOFOL N/A 05/08/2019   Procedure: ESOPHAGOGASTRODUODENOSCOPY (EGD) WITH PROPOFOL;  Surgeon: Ronnette Juniper, MD;  Location: [place];  Service: Gastroenterology;  Laterality:  N/A;   HEMOSTASIS CLIP PLACEMENT  05/08/2019   Procedure: HEMOSTASIS CLIP PLACEMENT;  Surgeon: Ronnette Juniper, MD;  Location: [facility] ENDOSCOPY;  Service: Gastroenterology;;   IR PARACENTESIS  09/25/2018   IR PARACENTESIS  10/16/2018   KNEE ARTHROSCOPY W/ DEBRIDEMENT  2011   "left; for arthritis" (10/09/2012)    Family History: Family History  Problem Relation Age of Onset   Heart failure Mother 10       Died   Diabetes Mother    Diabetes Father    CAD Father 71    Social History: Social History   Socioeconomic History   Marital status: Single    Spouse name: Not on file   Number of children: Not on file   Years of education: Not on file   Highest education level: Not on file  Occupational History  Not on file  Social Needs   Financial resource strain: Not on file   Food insecurity    Worry: Not on file    Inability: Not on file   Transportation needs    Medical: Not on file    Non-medical: Not on file  Tobacco Use   Smoking status: Never Smoker   Smokeless tobacco: Never Used  Substance and Sexual Activity   Alcohol use: No    Alcohol/week: 0.0 standard drinks   Drug use: No   Sexual activity: Not Currently  Lifestyle   Physical activity    Days per week: Not on file    Minutes per session: Not on file   Stress: Not on file  Relationships   Social connections    Talks on phone: Not on file    Gets together: Not on file    Attends religious service: Not on file    Active member of club or organization: Not on file    Attends meetings of clubs or organizations: Not on file    Relationship status: Not on file  Other Topics Concern   Not on file  Social History Narrative   Lives alone.    Allergies:  No Known Allergies  Objective:    Vital Signs:   Temp:  [97.4 F (36.3 C)-98.2 F (36.8 C)] 97.4 F (36.3 C) (07/29 0530) Pulse Rate:  [40-147] 147 (07/29 1120) Resp:  [19-47] 28 (07/29 1120) BP: (80-159)/(44-121) 130/56 (07/29  1120) SpO2:  [63 %-100 %] 63 % (07/29 0929) FiO2 (%):  [100 %] 100 % (07/29 1120) Weight:  [102.5 kg] 102.5 kg (07/29 0530) Last BM Date: 05/28/19  Weight change: Filed Weights   05-30-2019 0530  Weight: 102.5 kg    Intake/Output:   Intake/Output Summary (Last 24 hours) at 05-30-2019 1156 Last data filed at 05/28/2019 2130 Gross per 24 hour  Intake 527.75 ml  Output 150 ml  Net 377.75 ml      Physical Exam    General:  Intubated  HEENT: ETT Neck: supple. JVP elevated. . Carotids 2+ bilat; no bruits. No lymphadenopathy or thyromegaly appreciated. Cor: PMI nondisplaced. Irregular rate & rhythm. No rubs, gallops or murmurs. Lungs: Decreased Abdomen: soft, nontender, nondistended. No hepatosplenomegaly. No bruits or masses. Good bowel sounds. Extremities: Cool, no cyanosis, clubbing, rash,  R and LLE 3+ edema Neuro: intubated  Telemetry   A fib RVR 130s   EKG     Afib RVR 145 bpm   Labs   Basic Metabolic Panel: Recent Labs  Lab 05/14/2019 1800 05/12/2019 1940 05/18/2019 2321 05/28/19 0340 05/28/19 2342 05-30-2019 0548  NA 142 145 143 142 138 141  K 3.9 3.9 3.8 3.7 4.0 4.4  CL 112*  --   --  110 110 108  CO2 13*  --   --  13* 12* 11*  GLUCOSE 113*  --   --  131* 151* 109*  BUN 26*  --   --  30* 34* 36*  CREATININE 1.83*  --   --  1.96* 2.14* 2.45*  CALCIUM 8.9  --   --  9.1 8.6* 8.8*  MG  --   --   --  1.9  --   --   PHOS  --   --   --  3.5  --   --     Liver Function Tests: Recent Labs  Lab 05/13/2019 1930 05/28/19 0340  AST 34 46*  ALT 29 30  ALKPHOS 124 135*  BILITOT 1.2 1.1  PROT 7.4 7.9  ALBUMIN 3.0* 3.5   No results for input(s): LIPASE, AMYLASE in the last 168 hours. No results for input(s): AMMONIA in the last 168 hours.  CBC: Recent Labs  Lab 05/20/2019 1800  05/12/2019 2321 05/28/19 0340 05/28/19 2336 06-06-19 0548 06/06/19 1034  WBC 10.6*  --   --  11.5* 12.9* 14.4* 17.5*  NEUTROABS 8.7*  --   --   --   --  11.4*  --   HGB 7.9*   < > 8.8*  8.5* 8.0* 7.9* 8.1*  HCT 27.8*   < > 26.0* 31.5* 27.7* 28.3* 29.2*  MCV 87.1  --   --  90.5 85.5 87.6 90.1  PLT 601*  --   --  675* 506* 672* 709*   < > = values in this interval not displayed.    Cardiac Enzymes: No results for input(s): CKTOTAL, CKMB, CKMBINDEX, TROPONINI in the last 168 hours.  BNP: BNP (last 3 results) Recent Labs    05/22/2019 1922 06-Jun-2019 0548  BNP 1,641.2* 1,384.8*    ProBNP (last 3 results) No results for input(s): PROBNP in the last 8760 hours.   CBG: Recent Labs  Lab 05/28/19 18-Dec-2340  GLUCAP 133*    Coagulation Studies: Recent Labs    05/26/2019 12-19-2307  LABPROT 34.6*  INR 3.5*     Imaging   Dg Chest Port 1 View  Result Date: 06-06-19 CLINICAL DATA:  Respiratory failure EXAM: PORTABLE CHEST 1 VIEW COMPARISON:  05/28/2019 FINDINGS: Interval placement of endotracheal tube with distal tip in the right mainstem bronchus. Interval placement of enteric tube with distal tip in the proximal stomach and side hole in the distal esophagus. Cardiomegaly, unchanged. Small to moderate bilateral pleural effusions. No new focal airspace consolidation. No pneumothorax. IMPRESSION: 1. Endotracheal tube terminates within the right mainstem bronchus. Recommend retraction approximately 5 cm. 2. Enteric tube side hole within the distal esophagus. Recommend advancement 7-10 cm. 3. Cardiomegaly and small to moderate bilateral pleural effusions. These results will be called to the ordering clinician or representative by the Radiologist Assistant, and communication documented in the PACS or zVision Dashboard. Electronically Signed   By: Davina Poke M.D.   On: 06/06/19 10:43   Dg Chest Port 1 View  Result Date: 05/28/2019 CLINICAL DATA:  Acute respiratory distress EXAM: PORTABLE CHEST 1 VIEW COMPARISON:  05/26/2019 FINDINGS: Cardiomegaly.  No frank interstitial edema. Moderate right pleural effusion, mildly increased. Small left pleural effusion, unchanged.  No  pneumothorax. IMPRESSION: Moderate right and small left pleural effusions, mildly progressive. No frank interstitial edema. Electronically Signed   By: Julian Hy M.D.   On: 05/28/2019 23:44   [place] Ascites (abdomen Limited)  Result Date: 05/28/2019 CLINICAL DATA:  Bloating for 1 week EXAM: LIMITED ABDOMEN ULTRASOUND FOR ASCITES TECHNIQUE: Limited ultrasound survey for ascites was performed in all four abdominal quadrants. COMPARISON:  CT May 09, 2019 FINDINGS: Small amount of abdominopelvic ascites is seen. IMPRESSION: Small amount of ascites. Electronically Signed   By: Prudencio Pair M.D.   On: 05/28/2019 18:18   US Pelvic Complete With Transvaginal  Result Date: 05/28/2019 CLINICAL DATA:  Vaginal bleeding since Friday EXAM: TRANSABDOMINAL AND TRANSVAGINAL ULTRASOUND OF PELVIS TECHNIQUE: Both transabdominal and transvaginal ultrasound examinations of the pelvis were performed. Transabdominal technique was performed for global imaging of the pelvis including uterus, ovaries, adnexal regions, and pelvic cul-de-sac. It was necessary to proceed with endovaginal exam following the transabdominal exam to visualize the . COMPARISON:  CT May 09, 2019 FINDINGS: Uterus Measurements: 10.2 x 6.0 x 7.4 cm = volume: 237 mL. The uterus appears heterogeneous and prominent. The uterus is anteverted. There are 3 posterior heterogeneous fundal uterine fibroids seen. The largest measures 3.3 x 3.0 x 4.0 cm with areas of calcification. Endometrium Thickness: 2.3 cm. Heterogeneous and prominent. No focal mass seen. Right ovary Nonvisualized.  No adnexal mass seen. Left ovary Measurements: 2.4 x 1.5 x 1.7 cm. = volume: 3 mL. Normal appearance/no adnexal mass. Other findings Small amount of free fluid seen within the cul-de-sac. IMPRESSION: 1. Fibroid uterus 2. Endometrial thickness is considered abnormal for a post-menopausal female. Endometrial sampling should be considered to exclude carcinoma. 3. Nonvisualized right ovary  4. Normal left ovary 5. small amount of free fluid in the cul-de-sac. Electronically Signed   By: Prudencio Pair M.D.   On: 05/28/2019 18:16      Medications:     Current Medications:  allopurinol  100 mg Oral QPM   atorvastatin  40 mg Oral QPM   Chlorhexidine Gluconate Cloth  6 each Topical Daily   EPINEPHrine       EPINEPHrine       fentaNYL (SUBLIMAZE) injection  50 mcg Intravenous Once   furosemide  40 mg Intravenous Once   latanoprost  1 drop Both Eyes QHS   pantoprazole  40 mg Oral QPM   sodium bicarbonate       sodium chloride flush  3 mL Intravenous Q12H     Infusions:  sodium chloride 10 mL/hr at 2019/06/27 1000   amiodarone     Followed by   amiodarone     heparin     norepinephrine (LEVOPHED) Adult infusion     sterile water 1,000 mL with sodium bicarbonate 75 mEq infusion 150 mL/hr at 06-27-19 1000   vasopressin (PITRESSIN) infusion - *FOR SHOCK*          Assessment/Plan   1. Acute Hypoxic Respiratory Failure  Intubated Jun 27, 2019 on 100%.   2. Shock, Cardiogenic  Lactic Acidosis- 11. On Bicarb drip . Check CVP and CO-OX  - Blood Cx pending.  - On norepi drip and will 2.5 mcg dobutamine. If CO-OX low will need to add IABP.   3. A Fib RVR  Off diltiazem with reduced EF. On amio drip 60 mg per hour.   4. Acute Systolic HF Echo today showed reduced EF 20-25% from previously 55-60% earlier this year.  Marked volume overload. Give 80 mg IV lasix now.  - Add 2.5 mcg dobutamine now. Continue norepi.  - No bb with shock.   5.  Anemia  Has vaginal bleeding. Hgb 8.5 .   6. AKI  Creatinine trending up . Limited urine output.     Length of Stay: 1  Amy Clegg, NP  06-27-2019, 11:56 AM  Advanced Heart Failure Team Pager [phone] (M-F; 7a - 4p)  Please contact [facility] for night-coverage after hours (4p -7a ) and weekends on [email or web address]  Patient seen with NP, agree with the above note.  She has had a complicated recent course,  readmitted yesterday with atrial fibrillation/RVR and shortness of breath.  Hemoglobin 8.1 which is around her recent baseline.  Atrial fibrillation has been chronic.  Overnight, she decompensated with respiratory failure and shock, lactate > 11 today.  She was intubated.  Echo showed EF 20-25%, moderate RV dilation/mild decreased systolic function, moderate MR, severe TR, possible PFO.  Prior echo in 1/20 showed EF 55-60%. Creatinine rising, up to 2.45.  She is now on norepinephrine gtt at 27 and amiodarone gtt started at 60, HR 90s-100s atrial fibrillation.  SBP now in upper 80s.  Co-ox 50% currently.  Hs-TnI 27,000.  ECG from today with slight anteroseptal STE.   General: Intubated/sedated Neck: JVP difficult but appears elevated, no thyromegaly or thyroid nodule.  Lungs: Rhonchi bilaterally. CV: Nondisplaced PMI.  Heart irregular S1/S2, no S3/S4, 2/6 HSM LLSB.  2+ edema to knees bilaterally.  No carotid bruit.  Normal pedal pulses.  Abdomen: Soft, nontender, no hepatosplenomegaly, mild distention.  Skin: Intact without lesions or rashes.  Neurologic: Intubated/sedated Extremities: Cool HEENT: Normal.   1. Cardiogenic shock: Echo reviewed, EF 20-25% with mild RV dysfunction, moderate MR, severe TR.  Lactate > 11 and low pH, on HCO3 gtt. Currently on norepinephrine 27.  Of note, hs-TnI 27000.  I reviewed ECG from 7/27, ?subtle inferior STE.  Creatinine rising, up to 2.45 currently with minimal UOP. Co-ox low at 50%.  Suspect marked volume overload.   - Continue norepinephrine to keep MAP 65 or above.  - Add dobutamine 2.5 mcg/kg/min.  - Lasix 80 mg IV x 1.  - If no improvement with dobutamine, plan IABP later this afternoon.  2. Atrial fibrillation: This has been chronic but had RVR at admission, initially on diltiazem gtt, now on amiodarone gtt. HR around 100 now.  - Continue amiodarone 60 mg/hr for now.  - Anticoagulation on hold with anemia/vaginal bleeding.  3. Acute hypoxemic respiratory  failure: Suspect due to pulmonary edema, intubated and followed by CCM.  4. CAD: ECG today with slight anteroseptal STE. Complained of dyspnea prior to intubation but not chest pain.  Hs-TnI sent today, was 27000.  I am concerned that MI could have been the cause of her presentation as she had dyspnea at that time as well as abnormal ECG.   - Atorvastatin 80 mg daily.  - ASA 81 daily.  - Will not send directly for angiography with rapidly rising creatinine, shock, etc.   5. AKI: In setting of shock. Rapid rise in creatinine to 2.45, suspect ATN.  With poor UOP, I am concerned that she will need CVVH given marked volume overload.  - Would arrange for HD access.  - Consult nephrology.  6. Anemia: Hgb has been low chronically.  This admission, she has had vaginal bleeding.  US showed uterine fibroids and thickened endometrium.  - If she has IABP placed, will use at least low therapeutic heparin.  7. Cirrhosis: ?NASH.   Loralie Champagne 2019/06/18 2:28 PM

## 2019-06-01 NOTE — Consult Note (Signed)
NAME:  Erin Holt, MRN:  [id number], DOB:  1951/08/23, LOS: 1 ADMISSION DATE:  05/02/2019, CONSULTATION DATE:  2019-06-15 REFERRING MD:  Dr. Doristine Bosworth, CHIEF COMPLAINT:  Respiratory failure   Brief History / HPI  Unable to obtain history from patient given mental status. History obtained through chart review.   Erin Coach Weechis a 68 y.o.femalewith medical history significant with A-fib on Xarelto, HFrEF, CKD, prior CVA, OSA, and prior GI bleed who presented to the ED on 05/13/2019 with worsening anemia. She was found to have a Hgb of 8.2 (stable compared to prior) but otherwise endorses symptoms consistent with volume overload. EKG was performed and she was found to be in A-fib with RVR. She was started on a diltiazem drip and admitted for further evaluation.  On the morning of transfer to the ICU rapid response was called for increased work of breathing. ABG at the time illustrated a compensatory respiratory alkalosis for a HAGMA. She unfortunately continued to have increase work of breathing and repeat ABG illustrated a metabolic alkalosis with undetectable pCO2. She was transferred to the ICU for intubation.   Past Medical History  HFrEF  CVA HTN A-fib  Significant Hospital Events   7/28 Admission  7/29 Transferred to ICU and intubated  Consults:  Cardiology  Procedures:  7/29 Intubated 7/19 Femoral CVC  Significant Diagnostic Tests:  Echocardiogram 2/28 > LVEF 20-25% with reduced RV systolic function, RAE, LAE, moderate MR, severe TR  Micro Data:  7/27 COVID > Negative   Antimicrobials:  None  Interim history/subjective:  Deteriorate in respiratory status. Transferred to ICU  Objective   Blood pressure (!) 141/107, pulse (!) 118, temperature (!) 97.4 F (36.3 C), temperature source Oral, resp. rate (!) 29, weight 102.5 kg, SpO2 100 %.        Intake/Output Summary (Last 24 hours) at 06-15-2019 1002 Last data filed at 05/28/2019 2130 Gross per 24 hour  Intake  527.75 ml  Output 150 ml  Net 377.75 ml   Filed Weights   06/15/19 0530  Weight: 102.5 kg   Examination: General: Obese female, sedated HENT: Normocephalic, atraumatic, moist mucus membranes, ETT in place Pulm: Good air movement with no wheezing or crackles  CV: RRR, no murmurs, no rubs, displaced PMI and prominent heave   Abdomen: Active bowel sounds, soft, non-distended, no tenderness to palpation  Extremities: Pulses palpable in all extremities, no LE edema  Skin: Cool and dry   Neuro: Alert and oriented x 3  Resolved Hospital Problem list   None  Assessment & Plan:   Acute Respiratory Failure  - Intubated for increased WOB  - Adjust FiO2 and PEEP to maintain SaO2 >88% - CXR pending  - ABG in 2 hours  Cardiogenic Shock  Acute on Chronic HFrEF  High Anion Gap Metabolic Acidosis  - Presented with signs and symptoms of volume overload in the setting of A-fib RVR  - Levo and vaso for pressure support  - Would check COOX but drawing from femoral would be inaccurate  - Diurese as tolerated  - AG 22 with lactic acid >10. Appropriate metabolic compensation. Unable to calculate respiratory compensation given undetectable pCO2. Likely related to hypoperfusion in setting of acute on chronic HF, monitor abdomen. May need to image to look for signs of ischemia  - Trend LA  - Appreciate cardiology consult   A-fib with RVR  - Presented with signs and symptoms of volume overload in the setting of A-fib RVR  - Initially started on Diltiazem.  Stop this medications. Can use amiodarone if convert back to A-fib  - Holding home Xarelto   CKD Stage III - Monitor renal function  - Avoid nephrotoxic mediation  - At risk for worsening renal function  - monitor urine output   Anemia  - Prior hospitalization this month with GI bleed and found to have small vessel erosions  - Continue to monitor Hgb   Best practice:  Diet: NPO Pain/Anxiety/Delirium protocol (if indicated): Fentanyl   VAP protocol (if indicated): Yes DVT prophylaxis: SCD GI prophylaxis: Protonix Glucose control: Goal <180 Mobility: Bed rest Code Status: Full Family Communication: Notified and updated Disposition: ICU  Labs   CBC: Recent Labs  Lab 05/21/2019 1800 05/11/2019 1940 05/18/2019 2321 05/28/19 0340 05/28/19 2336 22-Jun-2019 0548  WBC 10.6*  --   --  11.5* 12.9* 14.4*  NEUTROABS 8.7*  --   --   --   --  11.4*  HGB 7.9* 8.2* 8.8* 8.5* 8.0* 7.9*  HCT 27.8* 24.0* 26.0* 31.5* 27.7* 28.3*  MCV 87.1  --   --  90.5 85.5 87.6  PLT 601*  --   --  675* 506* 672*    Basic Metabolic Panel: Recent Labs  Lab 05/02/2019 1800 05/02/2019 1940 05/08/2019 2321 05/28/19 0340 05/28/19 2342 22-Jun-2019 0548  NA 142 145 143 142 138 141  K 3.9 3.9 3.8 3.7 4.0 4.4  CL 112*  --   --  110 110 108  CO2 13*  --   --  13* 12* 11*  GLUCOSE 113*  --   --  131* 151* 109*  BUN 26*  --   --  30* 34* 36*  CREATININE 1.83*  --   --  1.96* 2.14* 2.45*  CALCIUM 8.9  --   --  9.1 8.6* 8.8*  MG  --   --   --  1.9  --   --   PHOS  --   --   --  3.5  --   --    GFR: Estimated Creatinine Clearance: 25.6 mL/min (A) (by C-G formula based on SCr of 2.45 mg/dL (H)). Recent Labs  Lab 05/11/2019 1800 05/26/2019 2309 05/28/19 0340 05/28/19 0806 05/28/19 2336 06/22/2019 0548  WBC 10.6*  --  11.5*  --  12.9* 14.4*  LATICACIDVEN  --  5.9*  --  3.2*  --  10.6*    Liver Function Tests: Recent Labs  Lab 05/18/2019 1930 05/28/19 0340  AST 34 46*  ALT 29 30  ALKPHOS 124 135*  BILITOT 1.2 1.1  PROT 7.4 7.9  ALBUMIN 3.0* 3.5   No results for input(s): LIPASE, AMYLASE in the last 168 hours. No results for input(s): AMMONIA in the last 168 hours.  ABG    Component Value Date/Time   PHART 7.171 (LL) 06/22/19 0910   PCO2ART BELOW REPORTABLE RANGE 06-22-19 0910   PO2ART 150 (H) 06-22-19 0910   HCO3 4.3 (L) 06-22-19 0910   TCO2 15 (L) 05/22/2019 2321   ACIDBASEDEF 23.3 (H) 06-22-19 0910   O2SAT 97.3 06/22/19 0910      Coagulation Profile: Recent Labs  Lab 05/03/2019 2309  INR 3.5*    Cardiac Enzymes: No results for input(s): CKTOTAL, CKMB, CKMBINDEX, TROPONINI in the last 168 hours.  HbA1C: Hgb A1c MFr Bld  Date/Time Value Ref Range Status  10/10/2012 06:25 AM 5.8 (H) <5.7 % Final    Comment:    (NOTE)  According to the ADA Clinical Practice Recommendations for 2011, when HbA1c is used as a screening test:  >=6.5%   Diagnostic of Diabetes Mellitus           (if abnormal result is confirmed) 5.7-6.4%   Increased risk of developing Diabetes Mellitus References:Diagnosis and Classification of Diabetes Mellitus,Diabetes VFIE,3329,51(OACZY 1):S62-S69 and Standards of Medical Care in         Diabetes - 2011,Diabetes SAYT,0160,10 (Suppl 1):S11-S61.    CBG: Recent Labs  Lab 05/28/19 2342  GLUCAP 133*    Review of Systems:   Unable to obtain due to critical illness   Past Medical History  She,  has a past medical history of Arthritis, Atrial fibrillation ([place]), Atrial fibrillation ([place]) (05/2019), Carotid artery occlusion, Glaucoma (increased eye pressure), Gout, echocardiogram, Hypertension, and Stroke ([place]) (10/09/2012).   Surgical History    Past Surgical History:  Procedure Laterality Date  . BREAST BIOPSY  1980's   "left; it wasn't anything" (10/09/2012)  . COLONOSCOPY WITH PROPOFOL N/A 12/12/2016   Procedure: COLONOSCOPY WITH PROPOFOL;  Surgeon: Garlan Fair, MD;  Location: [facility];  Service: Endoscopy;  Laterality: N/A;  . COLONOSCOPY WITH PROPOFOL N/A 05/09/2019   Procedure: COLONOSCOPY WITH PROPOFOL;  Surgeon: Laurence Spates, MD;  Location: [place];  Service: Endoscopy;  Laterality: N/A;  . ESOPHAGOGASTRODUODENOSCOPY (EGD) WITH PROPOFOL N/A 05/08/2019   Procedure: ESOPHAGOGASTRODUODENOSCOPY (EGD) WITH PROPOFOL;  Surgeon: Ronnette Juniper, MD;  Location: [place];  Service: Gastroenterology;  Laterality:  N/A;  . HEMOSTASIS CLIP PLACEMENT  05/08/2019   Procedure: HEMOSTASIS CLIP PLACEMENT;  Surgeon: Ronnette Juniper, MD;  Location: [place];  Service: Gastroenterology;;  . IR PARACENTESIS  09/25/2018  . IR PARACENTESIS  10/16/2018  . KNEE ARTHROSCOPY W/ DEBRIDEMENT  2011   "left; for arthritis" (10/09/2012)     Social History   reports that she has never smoked. She has never used smokeless tobacco. She reports that she does not drink alcohol or use drugs.   Family History   Her family history includes CAD (age of onset: 74) in her father; Diabetes in her father and mother; Heart failure (age of onset: 34) in her mother.   Allergies No Known Allergies   Home Medications  Prior to Admission medications   Medication Sig Start Date End Date Taking? Authorizing Provider  allopurinol (ZYLOPRIM) 100 MG tablet Take 100 mg by mouth every evening.   Yes [provider]  amoxicillin-clavulanate (AUGMENTIN) 875-125 MG tablet Take 1 tablet by mouth every 12 (twelve) hours for 7 days. 05/22/19 Jun 26, 2019 Yes Hall, Carole N, DO  atorvastatin (LIPITOR) 40 MG tablet Take 40 mg by mouth every evening.  05/06/15  Yes [provider]  Brinzolamide-Brimonidine (SIMBRINZA) 1-0.2 % SUSP Place 1 drop into both eyes at bedtime.   Yes [provider]  diltiazem (CARTIA XT) 240 MG 24 hr capsule Take 240 mg by mouth every evening.    Yes [provider]  furosemide (LASIX) 20 MG tablet Take 1 tablet (20 mg total) by mouth daily. 05/22/19  Yes Irene Pap N, DO  lactulose (CHRONULAC) 10 GM/15ML solution Take 10 g by mouth daily as needed for mild constipation.  11/06/18  Yes [provider]  latanoprost (XALATAN) 0.005 % ophthalmic solution Place 1 drop into both eyes at bedtime.   Yes [provider]  pantoprazole (PROTONIX) 40 MG tablet Take 1 tablet (40 mg total) by mouth daily. Patient taking differently: Take 40 mg by mouth every evening.  05/23/19  Yes  Irene Pap N,  DO  spironolactone (ALDACTONE) 50 MG tablet Take 1 tablet (50 mg total) by mouth daily. Patient taking differently: Take 50 mg by mouth every evening.  05/23/19  Yes Hall, Carole N, DO  XARELTO 20 MG TABS tablet TAKE 1 TABLET (20 MG TOTAL) BY MOUTH DAILY WITH SUPPER. Patient taking differently: Take 20 mg by mouth daily.  05/23/19  Yes Minus Breeding, MD    Ina Homes, MD IMTS PGY3  Pager: [phone]

## 2019-06-01 NOTE — Progress Notes (Signed)
Left sign out for attending about LA 10.6.  KJKG, NP [facility]

## 2019-06-01 NOTE — CV Procedure (Cosign Needed)
Radial arterial line placement.    Operators: Jerrye Bushy, NP, Candee Furbish, MD  Emergent consent assumed. The right wrist was prepped and draped in the routine sterile fashion a single lumen radial arterial catheter was placed in the right radial artery using a modified Seldinger technique. Good blood flow and wave forms. A dressing was placed.     Glori Bickers, MD  3:22 PM

## 2019-06-01 NOTE — Progress Notes (Signed)
PCCM progress note  Patient is deteriorated through the day with severe acidosis, elevated lactic acid, anuric renal failure Currently maxed out on levo, vasopressin.  Starting dobutamine and epinephrine Consulted nephrology for CRRT.   I called and spoke with her brother over the phone and he has requested that we stop all interventions and transition to comfort care. He is in [place] and is not able to visit. There are no relatives in the area.  He had requested that her pastor Harrie Jeans be called to come to her bedside.  The patient is critically ill with multiple organ system failure and requires high complexity decision making for assessment and support, frequent evaluation and titration of therapies, advanced monitoring, review of radiographic studies and interpretation of complex data.   Critical Care Time devoted to patient care services, exclusive of separately billable procedures, described in this note is 35 minutes.   Marshell Garfinkel MD [facility] Pager [phone] If no answer call [phone] June 12, 2019, 3:44 PM

## 2019-06-01 NOTE — Discharge Summary (Addendum)
Physician Death Summary  Patient ID: Erin Holt MRN: [id number] DOB/AGE: 69/12/1950 69 y.o.  Admit date: 05/25/2019 Discharge date: 2019/06/05  Admission Diagnoses: Respiratory failure CHF  Discharge Diagnoses:  Multiorgan failure Acute respiratory failure Cardiogenic shock Myocardial infarction Acute kidney injury  Discharged Condition: Deceased  Hospital Course:  68 year old with history of atrial fibrillation, CHF, CKD, OSA with recent admission for arm cellulitis, GI bleed. (underwent EGD, colonoscopy) history of [place] cirrhosis Readmitted on 7/27 with dyspnea, lower extremity swelling, volume overload.  Went into atrial fibrillation and started on Cardizem. PCCM called on 7/29 for increased work of breathing, hypoxia, elevated lactic acid.  Transferred to the ICU and emergently intubated and central line placed  Found to be in severe cardiogenic shock with MI, anuric renal failure, lactic acidosis.  She was started on pressors, heart failure team consulted. Patient is deteriorated through the day with severe acidosis, elevated lactic acid, anuric renal failure Maxed out on levo, vasopressin, dobutamine. Consulted nephrology for CRRT.   ICU team called and spoke with her brother over the phone and he has requested that we stop all interventions and transition to comfort care. He is in [place] and is not able to visit. There are no relatives in the area.  He had requested that her pastor Harrie Jeans be called to come to her bedside She was terminally extubated with pastor at bedside.  SignedMarshell Garfinkel June 05, 2019, 4:56 PM

## 2019-06-01 NOTE — Procedures (Signed)
Intubation Procedure Note NIMCO BIVENS  [id number]  05/26/1951    Procedure: Intubation Indications: Respiratory insufficiency   Procedure Details Consent: Unable to obtain consent because of emergent medical necessity. Time Out: Performed   Maximum clean technique was used including gloves, hand hygiene and mask  Laryngoscopy equipment used: MAC 3 Glidescope   Medications: Fentanyl 50 mcg, Versed 2 mg, Etomidate 20 mg, Rocuronium 60 mg   Grade 1 airway view   Evaluation Hemodynamic Status: BP stable throughout, stable throughout Patient's Current Condition: stable Patient did tolerate procedure well Chest X-ray ordered to verify placement.  pending  Ina Homes, MD IMTS PGY3  Pager: [phone]  2019/06/04 10:00 AM

## 2019-06-01 DEATH — deceased

## 2019-06-03 LAB — CULTURE, BLOOD (ROUTINE X 2): Culture: NO GROWTH

## 2020-07-20 IMAGING — CT CT OF THE LEFT FOREARM WITHOUT CONTRAST
2 of 6 series · 5 of 18 positions shown, 6 images · non-contrast
Comparison: None.

CLINICAL DATA: Patient admitted with gastrointestinal bleeding.
Left forearm pain. Question compartment syndrome.

EXAM:
CT OF THE LEFT FOREARM WITHOUT CONTRAST
TECHNIQUE: Multidetector CT imaging was performed according to the standard
protocol. Multiplanar CT image reconstructions were also generated.

[Series 9: upper ext cor bone · coronal · 0.46mm/px · 2 of 156 slices shown]
[im 54/156  bone]
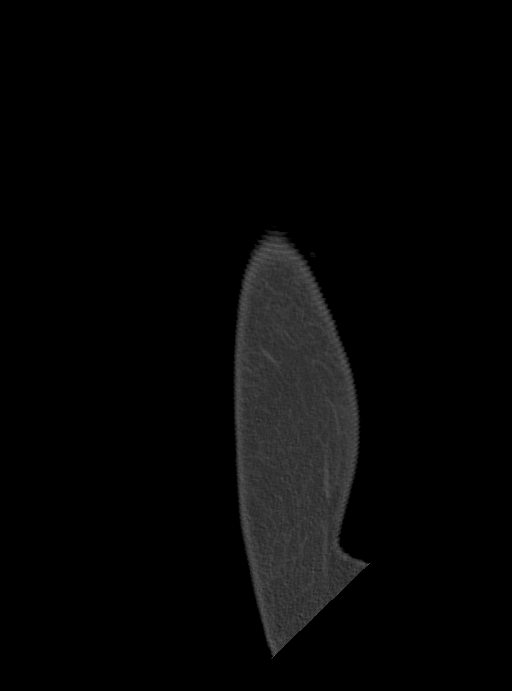
[im 108/156  bone]
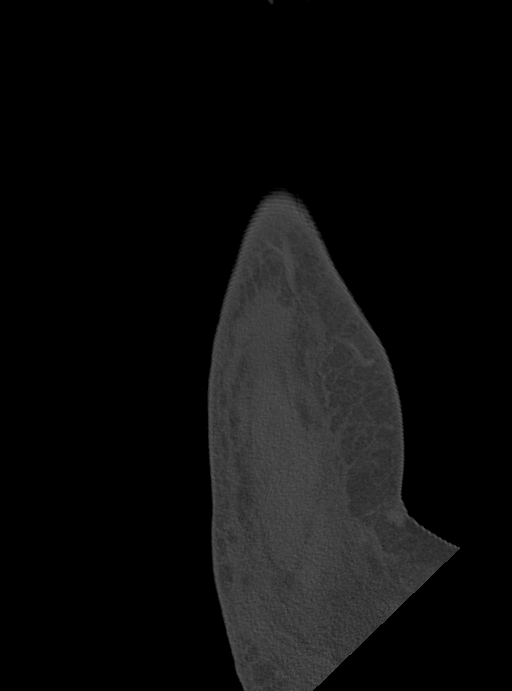

[Series 11: upper ext cor st · oblique · 0.69mm/px · 3 of 290 slices shown, 4 images]
[im 1/290  soft-tissue]
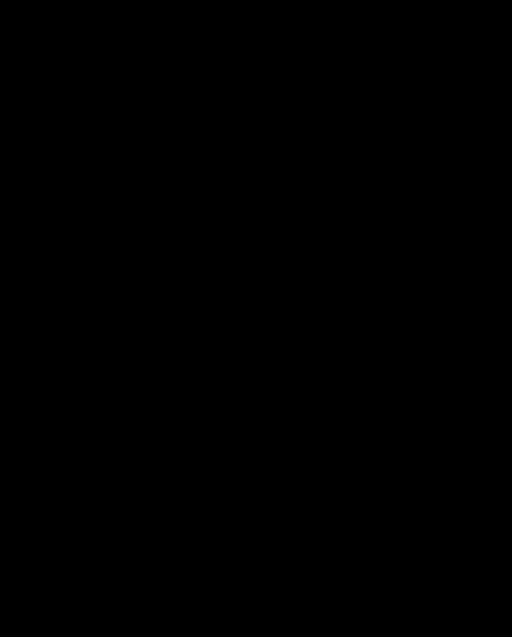
[im 1/290  bone]
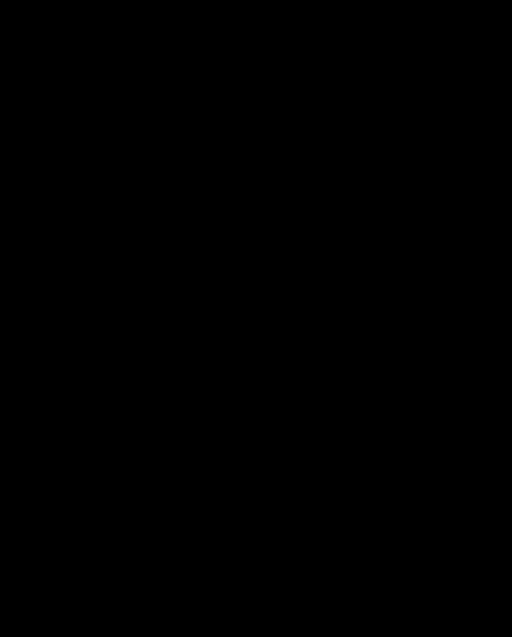
[im 145/290  bone]
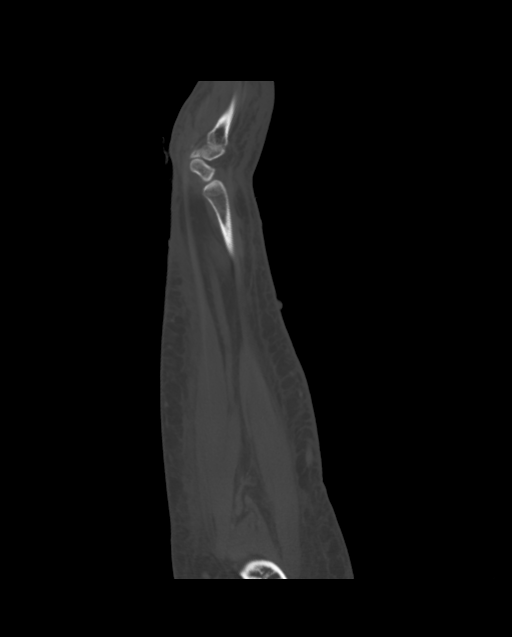
[im 290/290  bone]
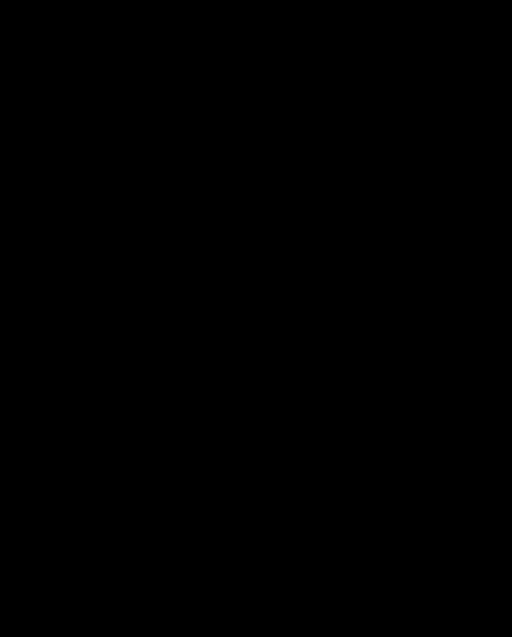

[5 of 18 positions shown; findings below may reference images not displayed]

FINDINGS: Bones/Joint/Cartilage

No acute or focal bony abnormality is identified. No joint effusion.

Ligaments

Suboptimally assessed by CT.

Muscles and Tendons

Appear intact. No mass or intramuscular fluid collection is
identified. No gas within muscle or tracking along fascial planes is
seen.

Soft tissues

Scattered subcutaneous edema is present. No focal fluid collection.
Multiple skin blisters are identified. The largest is along the
distal radius and measures 2 cm AP by 0.9 cm transverse by 2.1 cm
long.
IMPRESSION: CT scan is unable to diagnose compartment syndrome. If there is
concern for compartment syndrome, pressure measurements are
recommended.

Subcutaneous edema about the forearm could be due to dependent
change or cellulitis.

Scattered blisters on skin.

No acute bony or joint abnormality.
